# Patient Record
Sex: Female | Born: 1950 | Race: Black or African American | Hispanic: No | Smoking: Never smoker
Health system: Southern US, Community
[De-identification: ages and names within clinical notes are randomized; demographics above are authoritative.]

## PROBLEM LIST (undated history)

## (undated) ENCOUNTER — Ambulatory Visit: Admission: EM | Discharge status: Absent without leave | Payer: Medicare HMO

## (undated) DIAGNOSIS — D649 Anemia, unspecified: Secondary | ICD-10-CM

## (undated) DIAGNOSIS — I10 Essential (primary) hypertension: Secondary | ICD-10-CM

## (undated) DIAGNOSIS — N184 Chronic kidney disease, stage 4 (severe): Secondary | ICD-10-CM

## (undated) DIAGNOSIS — Z6835 Body mass index (BMI) 35.0-35.9, adult: Secondary | ICD-10-CM

## (undated) DIAGNOSIS — E785 Hyperlipidemia, unspecified: Secondary | ICD-10-CM

## (undated) DIAGNOSIS — K219 Gastro-esophageal reflux disease without esophagitis: Secondary | ICD-10-CM

## (undated) HISTORY — PX: TUBAL LIGATION: SHX77

## (undated) HISTORY — DX: Hyperlipidemia, unspecified: E78.5

## (undated) HISTORY — DX: Anemia, unspecified: D64.9

## (undated) HISTORY — DX: Morbid (severe) obesity due to excess calories: E66.01

## (undated) HISTORY — DX: Gastro-esophageal reflux disease without esophagitis: K21.9

## (undated) HISTORY — DX: Body mass index (BMI) 35.0-35.9, adult: Z68.35

## (undated) HISTORY — DX: Chronic kidney disease, stage 4 (severe): N18.4

---

## 2005-05-21 ENCOUNTER — Emergency Department: Payer: Self-pay | Admitting: Emergency Medicine

## 2006-06-18 ENCOUNTER — Emergency Department: Payer: Self-pay | Admitting: Emergency Medicine

## 2016-06-03 ENCOUNTER — Encounter: Payer: Self-pay | Admitting: *Deleted

## 2016-06-17 ENCOUNTER — Ambulatory Visit: Payer: Self-pay | Admitting: General Surgery

## 2016-06-29 ENCOUNTER — Ambulatory Visit (INDEPENDENT_AMBULATORY_CARE_PROVIDER_SITE_OTHER): Payer: Medicare HMO | Admitting: General Surgery

## 2016-06-29 ENCOUNTER — Encounter: Payer: Self-pay | Admitting: General Surgery

## 2016-06-29 VITALS — BP 124/78 | HR 74 | Resp 12 | Ht 63.0 in | Wt 213.0 lb

## 2016-06-29 DIAGNOSIS — Z1211 Encounter for screening for malignant neoplasm of colon: Secondary | ICD-10-CM | POA: Diagnosis not present

## 2016-06-29 MED ORDER — POLYETHYLENE GLYCOL 3350 17 GM/SCOOP PO POWD: ORAL | 0 refills | Status: DC

## 2016-06-29 NOTE — Progress Notes (Signed)
Patient ID: Destiny Adams, female   DOB: 01-30-1951, 65 y.o.   MRN: [id number]  Chief Complaint  Patient presents with  . Colonoscopy    HPI Destiny Adams is a 65 y.o. female Here today for a evaluation of a screening colonoscopy. Patient states she has no GI problem at this time. The patient's younger sister, Destiny Adams is present at visit.   I personally reviewed the patient's history. HPI  Past Medical History:  Diagnosis Date  . GERD (gastroesophageal reflux disease)   . Hyperlipidemia     Past Surgical History:  Procedure Laterality Date  . TUBAL LIGATION      Family History  Problem Relation Age of Onset  . Colon cancer Maternal Uncle   . Colon cancer Paternal Uncle   . Breast cancer Paternal Aunt     52    Social History Social History  Substance Use Topics  . Smoking status: Never Smoker  . Smokeless tobacco: Never Used  . Alcohol use No    No Known Allergies  Current Outpatient Prescriptions  Medication Sig Dispense Refill  . amlodipine-atorvastatin (CADUET) 10-20 MG tablet Take 1 tablet by mouth daily.    . meloxicam (MOBIC) 15 MG tablet Take 15 mg by mouth daily.    Marland Kitchen omeprazole (PRILOSEC) 20 MG capsule Take 20 mg by mouth daily.     No current facility-administered medications for this visit.     Review of Systems Review of Systems  Constitutional: Negative.   Respiratory: Negative.   Cardiovascular: Negative.   Gastrointestinal: Negative.     Blood pressure 124/78, pulse 74, resp. rate 12, height 5\' 3"  (1.6 m), weight 213 lb (96.6 kg).  Physical Exam Physical Exam  Constitutional: She is oriented to person, place, and time. She appears well-developed and well-nourished.  Eyes: Conjunctivae are normal. No scleral icterus.  Neck: Neck supple.  Cardiovascular: Normal rate, regular rhythm and normal heart sounds.   Pulmonary/Chest: Effort normal and breath sounds normal.  Abdominal: Soft. Bowel sounds are normal.  Lymphadenopathy:    She has  no cervical adenopathy.  Neurological: She is alert and oriented to person, place, and time.  Skin: Skin is warm and dry.    Data Reviewed PCP notes of 03/23/2016 reviewed.  Laboratory studies showed elevated serum creatinine 2 and estimated GFR of 29. Normal TSH.  Assessment    Candidate for screening colonoscopy.  Depressed renal function.    Plan    Case discussed with Dr. Brynda Greathouse regarding the use of Mobitz in light of her renal disease.  She should tolerate a non-cathartic prep without additional renal impairment.    Colonoscopy with possible biopsy/polypectomy prn: Information regarding the procedure, including its potential risks and complications (including but not limited to perforation of the bowel, which may require emergency surgery to repair, and bleeding) was verbally given to the patient. Educational information regarding lower intestinal endoscopy was given to the patient. Written instructions for how to complete the bowel prep using Miralax were provided. The importance of drinking ample fluids to avoid dehydration as a result of the prep emphasized.  Patient has been scheduled for a colonoscopy on 07-29-16 at [facility].   This information has been scribed by Gaspar Cola CMA.   Gaspar Cola 06/29/2016, 9:58 AM

## 2016-06-29 NOTE — Patient Instructions (Signed)
Colonoscopy A colonoscopy is an exam to look at the entire large intestine (colon). This exam can help find problems such as tumors, polyps, inflammation, and areas of bleeding. The exam takes about 1 hour.  LET YOUR HEALTH CARE PROVIDER KNOW ABOUT:   Any allergies you have.  All medicines you are taking, including vitamins, herbs, eye drops, creams, and over-the-counter medicines.  Previous problems you or members of your family have had with the use of anesthetics.  Any blood disorders you have.  Previous surgeries you have had.  Medical conditions you have. RISKS AND COMPLICATIONS  Generally, this is a safe procedure. However, as with any procedure, complications can occur. Possible complications include:  Bleeding.  Tearing or rupture of the colon wall.  Reaction to medicines given during the exam.  Infection (rare). BEFORE THE PROCEDURE   Ask your health care provider about changing or stopping your regular medicines.  You may be prescribed an oral bowel prep. This involves drinking a large amount of medicated liquid, starting the day before your procedure. The liquid will cause you to have multiple loose stools until your stool is almost clear or light green. This cleans out your colon in preparation for the procedure.  Do not eat or drink anything else once you have started the bowel prep, unless your health care provider tells you it is safe to do so.  Arrange for someone to drive you home after the procedure. PROCEDURE   You will be given medicine to help you relax (sedative).  You will lie on your side with your knees bent.  A long, flexible tube with a light and camera on the end (colonoscope) will be inserted through the rectum and into the colon. The camera sends video back to a computer screen as it moves through the colon. The colonoscope also releases carbon dioxide gas to inflate the colon. This helps your health care provider see the area better.  During  the exam, your health care provider may take a small tissue sample (biopsy) to be examined under a microscope if any abnormalities are found.  The exam is finished when the entire colon has been viewed. AFTER THE PROCEDURE   Do not drive for 24 hours after the exam.  You may have a small amount of blood in your stool.  You may pass moderate amounts of gas and have mild abdominal cramping or bloating. This is caused by the gas used to inflate your colon during the exam.  Ask when your test results will be ready and how you will get your results. Make sure you get your test results.   This information is not intended to replace advice given to you by your health care provider. Make sure you discuss any questions you have with your health care provider.   Document Released: 07/31/2000 Document Revised: 05/24/2013 Document Reviewed: 04/10/2013 Elsevier Interactive Patient Education 2016 Elsevier Inc.  

## 2016-07-29 ENCOUNTER — Ambulatory Visit: Payer: Medicare HMO | Admitting: Anesthesiology

## 2016-07-29 ENCOUNTER — Ambulatory Visit
Admission: RE | Admit: 2016-07-29 | Discharge: 2016-07-29 | Disposition: A | Discharge status: Home / Self Care | Payer: Medicare HMO | Source: Ambulatory Visit | Attending: General Surgery | Admitting: General Surgery

## 2016-07-29 ENCOUNTER — Encounter
Admission: RE | Disposition: A | Discharge status: Home / Self Care | Payer: Self-pay | Source: Ambulatory Visit | Attending: General Surgery

## 2016-07-29 DIAGNOSIS — Z1211 Encounter for screening for malignant neoplasm of colon: Secondary | ICD-10-CM | POA: Insufficient documentation

## 2016-07-29 DIAGNOSIS — K219 Gastro-esophageal reflux disease without esophagitis: Secondary | ICD-10-CM | POA: Insufficient documentation

## 2016-07-29 DIAGNOSIS — I10 Essential (primary) hypertension: Secondary | ICD-10-CM | POA: Diagnosis not present

## 2016-07-29 DIAGNOSIS — Z79899 Other long term (current) drug therapy: Secondary | ICD-10-CM | POA: Insufficient documentation

## 2016-07-29 DIAGNOSIS — Z6836 Body mass index (BMI) 36.0-36.9, adult: Secondary | ICD-10-CM | POA: Insufficient documentation

## 2016-07-29 DIAGNOSIS — E785 Hyperlipidemia, unspecified: Secondary | ICD-10-CM | POA: Insufficient documentation

## 2016-07-29 HISTORY — DX: Essential (primary) hypertension: I10

## 2016-07-29 HISTORY — PX: COLONOSCOPY WITH PROPOFOL: SHX5780

## 2016-07-29 LAB — BASIC METABOLIC PANEL
Anion gap: 7 (ref 5–15)
BUN: 27 mg/dL — AB (ref 6–20)
CHLORIDE: 111 mmol/L (ref 101–111)
CO2: 19 mmol/L — AB (ref 22–32)
CREATININE: 1.9 mg/dL — AB (ref 0.44–1.00)
Calcium: 8.8 mg/dL — ABNORMAL LOW (ref 8.9–10.3)
GFR calc Af Amer: 31 mL/min — ABNORMAL LOW (ref >60)
GFR calc non Af Amer: 26 mL/min — ABNORMAL LOW (ref >60)
GLUCOSE: 106 mg/dL — AB (ref 65–99)
POTASSIUM: 3.7 mmol/L (ref 3.5–5.1)
Sodium: 137 mmol/L (ref 135–145)

## 2016-07-29 SURGERY — COLONOSCOPY WITH PROPOFOL
Anesthesia: General

## 2016-07-29 MED ORDER — SODIUM CHLORIDE 0.9 % IV SOLN
INTRAVENOUS | Status: DC
  Administered 2016-07-29: 10:00:00 via INTRAVENOUS

## 2016-07-29 MED ORDER — PROPOFOL 500 MG/50ML IV EMUL
INTRAVENOUS | Status: DC | PRN
  Administered 2016-07-29: 120 ug/kg/min via INTRAVENOUS

## 2016-07-29 MED ORDER — PROPOFOL 10 MG/ML IV BOLUS
INTRAVENOUS | Status: DC | PRN
  Administered 2016-07-29 (×2): 30 mg via INTRAVENOUS

## 2016-07-29 MED ORDER — MIDAZOLAM HCL 2 MG/2ML IJ SOLN
INTRAMUSCULAR | Status: DC | PRN
  Administered 2016-07-29: 1 mg via INTRAVENOUS

## 2016-07-29 MED ORDER — FENTANYL CITRATE (PF) 100 MCG/2ML IJ SOLN
INTRAMUSCULAR | Status: DC | PRN
  Administered 2016-07-29: 50 ug via INTRAVENOUS

## 2016-07-29 NOTE — H&P (Signed)
Destiny Adams [id number] Feb 11, 1951     HPI: Healthy 65 y/o woman for screening colonoscopy. Tolerated prep well.   Prescriptions Prior to Admission  Medication Sig Dispense Refill Last Dose  . amlodipine-atorvastatin (CADUET) 10-20 MG tablet Take 1 tablet by mouth daily.   07/29/2016 at Unknown time  . meloxicam (MOBIC) 15 MG tablet Take 15 mg by mouth daily.   07/26/2016 at Unknown time  . omeprazole (PRILOSEC) 20 MG capsule Take 20 mg by mouth daily.   07/26/2016  . polyethylene glycol powder (GLYCOLAX/MIRALAX) powder 255 grams one bottle for colonoscopy prep 255 g 0    Not on File Past Medical History:  Diagnosis Date  . GERD (gastroesophageal reflux disease)   . Hyperlipidemia   . Hypertension    Past Surgical History:  Procedure Laterality Date  . TUBAL LIGATION     Social History   Social History  . Marital status: Divorced    Spouse name: N/A  . Number of children: N/A  . Years of education: N/A   Occupational History  . Not on file.   Social History Main Topics  . Smoking status: Never Smoker  . Smokeless tobacco: Never Used  . Alcohol use No  . Drug use: No  . Sexual activity: Not on file   Other Topics Concern  . Not on file   Social History Narrative  . No narrative on file   Social History   Social History Narrative  . No narrative on file     ROS: Negative.     PE: HEENT: Negative. Lungs: Clear. Cardio: RR.  Assessment/Plan:  Proceed with planned endoscopy.   Robert Bellow 07/29/2016

## 2016-07-29 NOTE — Transfer of Care (Signed)
Immediate Anesthesia Transfer of Care Note  Patient: Destiny Adams  Procedure(s) Performed: Procedure(s): COLONOSCOPY WITH PROPOFOL (N/A)  Patient Location: PACU  Anesthesia Type:General  Level of Consciousness: awake and alert   Airway & Oxygen Therapy: Patient Spontanous Breathing and Patient connected to nasal cannula oxygen  Post-op Assessment: Report given to RN and Post -op Vital signs reviewed and stable  Post vital signs: Reviewed  Last Vitals:  Vitals:   07/29/16 0942  BP: (!) 154/98  Pulse: 100  Resp: 16  Temp: 36.7 C    Last Pain:  Vitals:   07/29/16 0942  TempSrc: Tympanic         Complications: No apparent anesthesia complications

## 2016-07-29 NOTE — Anesthesia Postprocedure Evaluation (Signed)
Anesthesia Post Note  Patient: Destiny Adams  Procedure(s) Performed: Procedure(s) (LRB): COLONOSCOPY WITH PROPOFOL (N/A)  Patient location during evaluation: PACU Anesthesia Type: General Level of consciousness: awake Pain management: pain level controlled Vital Signs Assessment: post-procedure vital signs reviewed and stable Respiratory status: spontaneous breathing Cardiovascular status: blood pressure returned to baseline Postop Assessment: no headache Anesthetic complications: no    Last Vitals:  Vitals:   07/29/16 1130 07/29/16 1140  BP: 135/82 (!) 151/88  Pulse: 77 77  Resp: (!) 21 14  Temp:      Last Pain:  Vitals:   07/29/16 1140  TempSrc:   PainSc: 0-No pain                 VAN STAVEREN,Janique Hoefer

## 2016-07-29 NOTE — Anesthesia Preprocedure Evaluation (Signed)
Anesthesia Evaluation  Patient identified by MRN, date of birth, ID band Patient awake    Reviewed: Allergy & Precautions, NPO status , Patient's Chart, lab work & pertinent test results  Airway Mallampati: II       Dental  (+) Upper Dentures   Pulmonary neg pulmonary ROS,     + decreased breath sounds      Cardiovascular Exercise Tolerance: Good hypertension, Pt. on medications  Rhythm:Regular     Neuro/Psych negative neurological ROS     GI/Hepatic Neg liver ROS, GERD  Medicated,  Endo/Other  Morbid obesity  Renal/GU negative Renal ROS     Musculoskeletal   Abdominal   Peds negative pediatric ROS (+)  Hematology negative hematology ROS (+)   Anesthesia Other Findings   Reproductive/Obstetrics                             Anesthesia Physical Anesthesia Plan  ASA: II  Anesthesia Plan: General   Post-op Pain Management:    Induction: Intravenous  Airway Management Planned: Natural Airway and Nasal Cannula  Additional Equipment:   Intra-op Plan:   Post-operative Plan:   Informed Consent: I have reviewed the patients History and Physical, chart, labs and discussed the procedure including the risks, benefits and alternatives for the proposed anesthesia with the patient or authorized representative who has indicated his/her understanding and acceptance.     Plan Discussed with: Surgeon  Anesthesia Plan Comments:         Anesthesia Quick Evaluation

## 2016-07-29 NOTE — Op Note (Signed)
[facility] Patient Name: Destiny Adams Procedure Date: 07/29/2016 10:25 AM MRN: [id number] Account #: [id number] Date of Birth: 1950/11/05 Admit Type: Outpatient Age: 65 Room: [facility] ENDO ROOM 1 Gender: Female Note Status: Finalized Procedure:            Colonoscopy Indications:          Screening for colorectal malignant neoplasm Providers:            Robert Bellow, MD Referring MD:         Mikeal Hawthorne. Brynda Greathouse MD, MD (Referring MD) Medicines:            Monitored Anesthesia Care Complications:        No immediate complications. Procedure:            Pre-Anesthesia Assessment:                       - Prior to the procedure, a History and Physical was                        performed, and patient medications, allergies and                        sensitivities were reviewed. The patient's tolerance of                        previous anesthesia was reviewed.                       - The risks and benefits of the procedure and the                        sedation options and risks were discussed with the                        patient. All questions were answered and informed                        consent was obtained.                       After obtaining informed consent, the colonoscope was                        passed under direct vision. Throughout the procedure,                        the patient's blood pressure, pulse, and oxygen                        saturations were monitored continuously. The                        Colonoscope was introduced through the anus and                        advanced to the the cecum, identified by appendiceal                        orifice and ileocecal valve. The colonoscopy was  performed without difficulty. The patient tolerated the                        procedure well. The quality of the bowel preparation                        was excellent. Findings:      The entire examined colon  appeared normal on direct and retroflexion       views. Impression:           - The entire examined colon is normal on direct and                        retroflexion views.                       - No specimens collected. Recommendation:       - Repeat colonoscopy in 10 years for screening purposes. Procedure Code(s):    --- Professional ---                       [phone], Colonoscopy, flexible; diagnostic, including                        collection of specimen(s) by brushing or washing, when                        performed (separate procedure) Diagnosis Code(s):    --- Professional ---                       Z12.11, Encounter for screening for malignant neoplasm                        of colon CPT copyright 2016 American Medical Association. All rights reserved. The codes documented in this report are preliminary and upon coder review may  be revised to meet current compliance requirements. Robert Bellow, MD 07/29/2016 10:54:50 AM This report has been signed electronically. Number of Addenda: 0 Note Initiated On: 07/29/2016 10:25 AM Scope Withdrawal Time: 0 hours 9 minutes 44 seconds  Total Procedure Duration: 0 hours 14 minutes 45 seconds       [facility]

## 2016-07-30 ENCOUNTER — Encounter: Payer: Self-pay | Admitting: General Surgery

## 2017-06-16 ENCOUNTER — Ambulatory Visit: Payer: Self-pay | Admitting: Family Medicine

## 2017-06-22 ENCOUNTER — Ambulatory Visit: Payer: Self-pay | Admitting: Family Medicine

## 2017-07-22 ENCOUNTER — Ambulatory Visit: Payer: Medicare HMO | Admitting: Nurse Practitioner

## 2017-07-22 ENCOUNTER — Other Ambulatory Visit: Payer: Self-pay

## 2017-07-22 ENCOUNTER — Encounter: Payer: Self-pay | Admitting: Nurse Practitioner

## 2017-07-22 VITALS — BP 138/90 | HR 92 | Temp 98.2°F | Ht 61.5 in | Wt 191.0 lb

## 2017-07-22 DIAGNOSIS — E782 Mixed hyperlipidemia: Secondary | ICD-10-CM

## 2017-07-22 DIAGNOSIS — K219 Gastro-esophageal reflux disease without esophagitis: Secondary | ICD-10-CM | POA: Diagnosis not present

## 2017-07-22 DIAGNOSIS — N184 Chronic kidney disease, stage 4 (severe): Secondary | ICD-10-CM

## 2017-07-22 DIAGNOSIS — Z7689 Persons encountering health services in other specified circumstances: Secondary | ICD-10-CM

## 2017-07-22 DIAGNOSIS — I10 Essential (primary) hypertension: Secondary | ICD-10-CM

## 2017-07-22 DIAGNOSIS — Z6835 Body mass index (BMI) 35.0-35.9, adult: Secondary | ICD-10-CM | POA: Diagnosis not present

## 2017-07-22 NOTE — Progress Notes (Signed)
Subjective:    Patient ID: Destiny Adams, female    DOB: 1950/08/25, 66 y.o.   MRN: [id number]  Destiny Adams is a 66 y.o. female presenting on 07/22/2017 for [place] (pt just recently loss her mother )   HPI [place] Provider Pt last seen by PCP Dr. Brynda Greathouse 2-3 months ago.  Obtain records from Dr. Brynda Greathouse.   CKD Stage IV Pt reports she has stage IV kidney disease.  She notes it stabilized once she started getting better blood pressure control.  She reports her last labs with Dr. Brynda Greathouse were about 5 months ago.  She has not yet seen a nephrologist.  Hypertension - She is not checking BP at home or outside of clinic.    - Current medications: amlodipine-benazepril, tolerating well without side effects - She is not currently symptomatic. - Pt denies headache, lightheadedness, dizziness, changes in vision, chest tightness/pressure, palpitations, leg swelling, sudden loss of speech or loss of consciousness. - She  reports no regular exercise routine. - Her diet is moderate in salt, moderate in fat, and moderate in carbohydrates.  GERD Pt has regular acid reflux that is well controlled on omeprazole.  She has trialed off omeprazole in past, but has severe heartburn.  She reports no breakthrough heartburn symptoms when she is taking her PPI and desires to continue.  Grief Pt's mother died about 2 weeks ago.  She had suffered many years from Alzheimer's disease.  Pt is well supported by family and faith support system.  Pt reports no significantly abnormal grief process to date.   Past Medical History:  Diagnosis Date  . Anemia   . CKD (chronic kidney disease) stage 4, GFR 15-29 ml/min (HCC) 07/23/2017  . GERD (gastroesophageal reflux disease)   . Hyperlipidemia   . Hypertension   . Severe obesity (BMI 35.0-35.9 with comorbidity) ([place]) 07/23/2017   Comorbid CKD stage IV, Hypertension, Hyperlipidemia   Past Surgical History:  Procedure Laterality Date  . COLONOSCOPY  WITH PROPOFOL N/A 07/29/2016   Procedure: COLONOSCOPY WITH PROPOFOL;  Surgeon: Robert Bellow, MD;  Location: [facility] ENDOSCOPY;  Service: Endoscopy;  Laterality: N/A;  . TUBAL LIGATION     Social History   Socioeconomic History  . Marital status: Divorced    Spouse name: Not on file  . Number of children: Not on file  . Years of education: Not on file  . Highest education level: Not on file  Social Needs  . Financial resource strain: Not on file  . Food insecurity - worry: Not on file  . Food insecurity - inability: Not on file  . Transportation needs - medical: Not on file  . Transportation needs - non-medical: Not on file  Occupational History  . Not on file  Tobacco Use  . Smoking status: Never Smoker  . Smokeless tobacco: Never Used  Substance and Sexual Activity  . Alcohol use: No  . Drug use: No  . Sexual activity: Not on file  Other Topics Concern  . Not on file  Social History Narrative  . Not on file   Family History  Problem Relation Age of Onset  . Colon cancer Maternal Uncle   . Colon cancer Paternal Uncle   . Breast cancer Paternal Aunt        59  . Alzheimer's disease Mother   . Cancer Father    Current Outpatient Medications on File Prior to Visit  Medication Sig  . amlodipine-atorvastatin (CADUET) 10-20 MG tablet Take  1 tablet by mouth daily.  Marland Kitchen omeprazole (PRILOSEC) 20 MG capsule Take 20 mg by mouth daily.   No current facility-administered medications on file prior to visit.     Review of Systems  Constitutional: Negative.   HENT: Negative.   Eyes: Negative.   Respiratory: Negative.   Cardiovascular: Negative.   Gastrointestinal: Negative.   Endocrine: Negative.   Genitourinary: Negative.   Musculoskeletal: Positive for arthralgias.  Skin: Negative.   Allergic/Immunologic: Negative.   Neurological: Negative.   Hematological: Negative.   Psychiatric/Behavioral: Negative.    Per HPI unless specifically indicated above        Objective:    BP 138/90 (BP Location: Right Arm, Patient Position: Sitting, Cuff Size: Normal)   Pulse 92   Temp 98.2 F (36.8 C) (Oral)   Ht 5' 1.5" (1.562 m)   Wt 191 lb (86.6 kg)   BMI 35.50 kg/m   Wt Readings from Last 3 Encounters:  07/22/17 191 lb (86.6 kg)  07/29/16 210 lb (95.3 kg)  06/29/16 213 lb (96.6 kg)    Physical Exam  General - healthy, well-appearing, NAD HEENT - Normocephalic, atraumatic Neck - supple, non-tender, no LAD, no thyromegaly, no carotid bruit Heart - RRR, no murmurs heard Lungs - Clear throughout all lobes, no wheezing, crackles, or rhonchi. Normal work of breathing. Extremeties - non-tender, no edema, cap refill < 2 seconds, peripheral pulses intact +2 bilaterally Skin - warm, dry Neuro - awake, alert, oriented x3, normal gait Psych - Normal mood and affect, normal behavior, tearful when discussing her mother's death but in appropriate response to feelings and easily redirected when needed.    Results for orders placed or performed during the hospital encounter of 24/23/53  Basic metabolic panel  Result Value Ref Range   Sodium 137 135 - 145 mmol/L   Potassium 3.7 3.5 - 5.1 mmol/L   Chloride 111 101 - 111 mmol/L   CO2 19 (L) 22 - 32 mmol/L   Glucose, Bld 106 (H) 65 - 99 mg/dL   BUN 27 (H) 6 - 20 mg/dL   Creatinine, Ser 1.90 (H) 0.44 - 1.00 mg/dL   Calcium 8.8 (L) 8.9 - 10.3 mg/dL   GFR calc non Af Amer 26 (L) >60 mL/min   GFR calc Af Amer 31 (L) >60 mL/min   Anion gap 7 5 - 15      Assessment & Plan:   Problem List Items Addressed This Visit      Cardiovascular and Mediastinum   Hypertension    Currently controlled hypertension, but slightly above BP goal of < 130/80 w/ known CKD Stage IV.  Pt is currently working on lifestyle modifications for improving diet.  She has lost about 15 lbs.  Taking medications amlodipine-benazepril and is tolerating well without side effects.  -Complications: CKD, obesity  Plan: 1. Continue taking  amlodipine-benazepril without changes today. - Focus on losing another 5-10 lbs before medication adjustment. 2. Obtain labs at next visit after reviewing results from Dr. Brynda Greathouse  3. Encouraged heart healthy diet and increasing exercise to 30 minutes most days of the week. 4. Check BP 1-2 x per week at home, keep log, and bring to clinic at next appointment. 5. Follow up 3 months.          Digestive   GERD (gastroesophageal reflux disease)    Currently well controlled on daily omeprazole.  Pt reports no intermittent or breakthrough heartburn.  Plan: 1. Continue omeprazole once daily. 2. Encouraged weight loss of  additional 5-10 lbs. 3. Encouraged smaller meals, avoid trigger foods, alcohol. 4. Followup every 6-12 months.        Genitourinary   CKD (chronic kidney disease) stage 4, GFR 15-29 ml/min (HCC)    Pt reports she has had stable GFR < 30 over last couple of years.  Uncontrolled hypertension was primary factor for worsening kidney function per pt.  Plan: 1. Request records for labs. 2. Consider referral to nephrology. 3. Continue HTN management. 4. Followup w/ labs every 3-6 months.         Other   Hyperlipidemia    Pt has not previously been on medications.  No lipid panel available.  Will await records from Dr. Brynda Greathouse.  Plan: 1. Recheck lipid panel at least every 6 months. 2. Encouraged heart healthy diet. 3. Encouraged regular physical activity most days of the week. 4. Followup in 3 months.      Severe obesity (BMI 35.0-35.9 with comorbidity) (HCC)    Pt overweight w/ recent loss of about 15 lbs per pt report.  Comorbid CKD stage IV, Hypertension, Hyperlipidemia.  Plan: 1. Encouraged continued weight loss of at least 5-10 lbs. 2. See AP comorbidities above. 3. Followup at least once annually.        Other Visit Diagnoses    Encounter to establish care    -  Primary   Pt w/ prior PCP Dr. Brynda Greathouse.  Last visit about 2 months ago. Records will be requested.   History reviewed w/ pt today.        Follow up plan: Return in about 3 months (around 10/20/2017) for blood pressure and possibly labs.  [name], DNP, AGPCNP-BC Adult Gerontology Primary Care Nurse Practitioner [facility] 07/23/2017, 10:01 AM

## 2017-07-22 NOTE — Patient Instructions (Addendum)
Destiny Adams, Thank you for coming in to clinic today.  1. For your cough, - You can use saline only nose spray for the dry sinuses.  2. For your blood pressure, - Continue amlodipine -benazepril without changes today.  3. For your heartburn, - Continue your omeprazole 20 mg once daily.  Please schedule a follow-up appointment with [name], AGNP. Return in about 3 months (around 10/20/2017) for blood pressure and possibly labs.  If you have any other questions or concerns, please feel free to call the clinic or send a message through [place]. You may also schedule an earlier appointment if necessary.  You will receive a survey after today's visit either digitally by e-mail or paper by [employer]. Your experiences and feedback matter to [place].  Please respond so we know how we are doing as we provide care for you.   [name], DNP, AGNP-BC Adult Gerontology Nurse Practitioner [place]

## 2017-07-23 ENCOUNTER — Encounter: Payer: Self-pay | Admitting: Nurse Practitioner

## 2017-07-23 DIAGNOSIS — Z6835 Body mass index (BMI) 35.0-35.9, adult: Secondary | ICD-10-CM

## 2017-07-23 DIAGNOSIS — K219 Gastro-esophageal reflux disease without esophagitis: Secondary | ICD-10-CM | POA: Insufficient documentation

## 2017-07-23 DIAGNOSIS — I10 Essential (primary) hypertension: Secondary | ICD-10-CM | POA: Insufficient documentation

## 2017-07-23 DIAGNOSIS — I120 Hypertensive chronic kidney disease with stage 5 chronic kidney disease or end stage renal disease: Secondary | ICD-10-CM | POA: Insufficient documentation

## 2017-07-23 DIAGNOSIS — N184 Chronic kidney disease, stage 4 (severe): Secondary | ICD-10-CM

## 2017-07-23 DIAGNOSIS — E785 Hyperlipidemia, unspecified: Secondary | ICD-10-CM | POA: Insufficient documentation

## 2017-07-23 DIAGNOSIS — N185 Chronic kidney disease, stage 5: Secondary | ICD-10-CM

## 2017-07-23 HISTORY — DX: Morbid (severe) obesity due to excess calories: E66.01

## 2017-07-23 HISTORY — DX: Chronic kidney disease, stage 4 (severe): N18.4

## 2017-07-23 NOTE — Assessment & Plan Note (Signed)
Pt has not previously been on medications.  No lipid panel available.  Will await records from Dr. Brynda Greathouse.  Plan: 1. Recheck lipid panel at least every 6 months. 2. Encouraged heart healthy diet. 3. Encouraged regular physical activity most days of the week. 4. Followup in 3 months.

## 2017-07-23 NOTE — Assessment & Plan Note (Signed)
Pt reports she has had stable GFR < 30 over last couple of years.  Uncontrolled hypertension was primary factor for worsening kidney function per pt.  Plan: 1. Request records for labs. 2. Consider referral to nephrology. 3. Continue HTN management. 4. Followup w/ labs every 3-6 months.

## 2017-07-23 NOTE — Assessment & Plan Note (Signed)
Currently controlled hypertension, but slightly above BP goal of < 130/80 w/ known CKD Stage IV.  Pt is currently working on lifestyle modifications for improving diet.  She has lost about 15 lbs.  Taking medications amlodipine-benazepril and is tolerating well without side effects.  -Complications: CKD, obesity  Plan: 1. Continue taking amlodipine-benazepril without changes today. - Focus on losing another 5-10 lbs before medication adjustment. 2. Obtain labs at next visit after reviewing results from Dr. Brynda Greathouse  3. Encouraged heart healthy diet and increasing exercise to 30 minutes most days of the week. 4. Check BP 1-2 x per week at home, keep log, and bring to clinic at next appointment. 5. Follow up 3 months.

## 2017-07-23 NOTE — Assessment & Plan Note (Signed)
Pt overweight w/ recent loss of about 15 lbs per pt report.  Comorbid CKD stage IV, Hypertension, Hyperlipidemia.  Plan: 1. Encouraged continued weight loss of at least 5-10 lbs. 2. See AP comorbidities above. 3. Followup at least once annually.

## 2017-07-23 NOTE — Assessment & Plan Note (Signed)
Currently well controlled on daily omeprazole.  Pt reports no intermittent or breakthrough heartburn.  Plan: 1. Continue omeprazole once daily. 2. Encouraged weight loss of additional 5-10 lbs. 3. Encouraged smaller meals, avoid trigger foods, alcohol. 4. Followup every 6-12 months.

## 2017-09-09 ENCOUNTER — Telehealth: Payer: Self-pay | Admitting: Nurse Practitioner

## 2017-09-09 NOTE — Telephone Encounter (Signed)
Pt said someone needs to contact [facility] about her omeprazole and amlodipine.  Her call back number is [phone]

## 2017-09-10 MED ORDER — OMEPRAZOLE 20 MG PO CPDR: 20.0000 mg | DELAYED_RELEASE_CAPSULE | Freq: Every day | ORAL | 1 refills | Status: DC

## 2017-09-10 MED ORDER — AMLODIPINE-ATORVASTATIN 10-20 MG PO TABS: 1.0000 | ORAL_TABLET | Freq: Every day | ORAL | 1 refills | Status: DC

## 2017-09-13 ENCOUNTER — Telehealth: Payer: Self-pay

## 2017-09-13 DIAGNOSIS — K219 Gastro-esophageal reflux disease without esophagitis: Secondary | ICD-10-CM

## 2017-09-13 DIAGNOSIS — I10 Essential (primary) hypertension: Secondary | ICD-10-CM

## 2017-09-13 DIAGNOSIS — E782 Mixed hyperlipidemia: Secondary | ICD-10-CM

## 2017-09-13 MED ORDER — OMEPRAZOLE 20 MG PO CPDR: 20.0000 mg | DELAYED_RELEASE_CAPSULE | Freq: Every day | ORAL | 1 refills | Status: DC

## 2017-09-13 MED ORDER — ATORVASTATIN CALCIUM 20 MG PO TABS: 20.0000 mg | ORAL_TABLET | Freq: Every day | ORAL | 3 refills | Status: DC

## 2017-09-13 MED ORDER — AMLODIPINE BESYLATE 10 MG PO TABS
10.0000 mg | ORAL_TABLET | Freq: Every day | ORAL | 3 refills | Status: DC
Start: 2017-09-13 — End: 2018-07-04

## 2017-09-13 NOTE — Telephone Encounter (Signed)
Will send caduet as separate prescriptions.  Pt will have 3 pills now, dosing and instructions are the same.

## 2017-09-13 NOTE — Telephone Encounter (Signed)
I called [facility] and they informed me that the Caduet was on Manufacturer backorder, but now do not even fill that medication. The reason for the calls is because they are trying to get refill medications from Dr. Brynda Greathouse office.

## 2017-09-13 NOTE — Telephone Encounter (Signed)
Pt called requesting that e send her bp & acid reflux medication to [facility]. I informed the pt that we sent her Caduet 10-20MG  and Omeprazole on last week over to [facility], but I will call and check on the status of them prescriptions since they are still calling stating they need a new prescription.

## 2017-09-20 ENCOUNTER — Telehealth: Payer: Self-pay | Admitting: Nurse Practitioner

## 2017-09-20 NOTE — Telephone Encounter (Signed)
Pt has a question about atorvastatin [phone]

## 2017-09-20 NOTE — Telephone Encounter (Signed)
The pt was notified about her medication. No questions or concerns.

## 2017-09-20 NOTE — Telephone Encounter (Signed)
Attempted to contact the pt, no answer. LMOM to return my call.  

## 2017-10-05 ENCOUNTER — Telehealth: Payer: Self-pay | Admitting: Nurse Practitioner

## 2017-10-05 NOTE — Telephone Encounter (Signed)
Called to schedule AWV with Nurse Health Advisor. °Kathryn Brown °[phone]  °Skype [email or web address]  ° °

## 2017-10-21 ENCOUNTER — Ambulatory Visit: Payer: Medicare HMO | Admitting: Nurse Practitioner

## 2017-11-05 ENCOUNTER — Other Ambulatory Visit: Payer: Self-pay

## 2017-11-05 ENCOUNTER — Encounter: Payer: Self-pay | Admitting: Nurse Practitioner

## 2017-11-05 ENCOUNTER — Ambulatory Visit (INDEPENDENT_AMBULATORY_CARE_PROVIDER_SITE_OTHER): Payer: Medicare HMO | Admitting: Nurse Practitioner

## 2017-11-05 VITALS — BP 135/85 | HR 106 | Ht 61.5 in | Wt 189.2 lb

## 2017-11-05 DIAGNOSIS — K219 Gastro-esophageal reflux disease without esophagitis: Secondary | ICD-10-CM | POA: Diagnosis not present

## 2017-11-05 DIAGNOSIS — N184 Chronic kidney disease, stage 4 (severe): Secondary | ICD-10-CM | POA: Diagnosis not present

## 2017-11-05 DIAGNOSIS — I10 Essential (primary) hypertension: Secondary | ICD-10-CM

## 2017-11-05 DIAGNOSIS — J301 Allergic rhinitis due to pollen: Secondary | ICD-10-CM | POA: Insufficient documentation

## 2017-11-05 DIAGNOSIS — E782 Mixed hyperlipidemia: Secondary | ICD-10-CM

## 2017-11-05 MED ORDER — FLUTICASONE PROPIONATE 50 MCG/ACT NA SUSP: 2.0000 | Freq: Every day | NASAL | 6 refills | Status: DC

## 2017-11-05 MED ORDER — OMEPRAZOLE 20 MG PO CPDR: 20.0000 mg | DELAYED_RELEASE_CAPSULE | Freq: Every day | ORAL | 2 refills | Status: DC

## 2017-11-05 MED ORDER — LORATADINE 10 MG PO TABS: 10.0000 mg | ORAL_TABLET | Freq: Every day | ORAL | 2 refills | Status: DC

## 2017-11-05 NOTE — Assessment & Plan Note (Signed)
Currently controlled hypertension, but slightly above BP goal of < 130/80 w/ known CKD Stage IV.  Pt is currently working on lifestyle modifications for improving diet.  Worsening BP today after acute stress from a family argument. -Complications: CKD, obesity  Plan: 1. Continue taking amlodipine without changes today. 2. Obtain labs at next visit after reviewing results from Dr. Brynda Greathouse  3. Encouraged heart healthy diet and increasing exercise to 30 minutes most days of the week. 4. Check BP 1-2 x per week at home, keep log, and bring to clinic at next appointment. Goal < 130/80.  Pt verbalized understanding she will call if remains above goal. 5. Follow up 3 months.

## 2017-11-05 NOTE — Assessment & Plan Note (Signed)
Currently well controlled on daily omeprazole.  Pt reports no intermittent or breakthrough heartburn.  Plan: 1. Continue omeprazole once daily. 2. Encouraged weight loss of additional 5-10 lbs. 3. Encouraged smaller meals, avoid trigger foods, alcohol. 4. Followup 6 months.

## 2017-11-05 NOTE — Assessment & Plan Note (Signed)
Acute onset of excessive tearing of eyes that is relieved on days pt is taking loratadine.  Pt with seasonal pattern to symptoms and prior regular use of loratadine.   Plan: 1. Continue loratadine.  Take daily. - USE flonase 1-2 sprays in each nostril daily for 4-6 weeks. 2. Consider eye allergy drop if needed.   3. Followup as needed

## 2017-11-05 NOTE — Patient Instructions (Signed)
Destiny Adams,   Thank you for coming in to clinic today.  Continue all medications without changes today.   - Continue your low salt diet - Increase your physical activity until you are increasing your heart rate for 30 minutes on most days of the week.  For your allergies: - Take loratadine(Claritin) daily for the next 2-3 months during your allergy season. - If worsens, can use flonase 1-2 sprays in each nostril daily for 6-8 weeks.  Please schedule a follow-up appointment with [name], AGNP. Return in about 3 months (around 02/05/2018) for hypertension AND labs next week.  If you have any other questions or concerns, please feel free to call the clinic or send a message through [place]. You may also schedule an earlier appointment if necessary.  You will receive a survey after today's visit either digitally by e-mail or paper by [employer]. Your experiences and feedback matter to [place].  Please respond so we know how we are doing as we provide care for you.   [name], DNP, AGNP-BC Adult Gerontology Nurse Practitioner [facility], [facility]   Stress and Stress Management Stress is a normal reaction to life events. It is what you feel when life demands more than you are used to or more than you can handle. Some stress can be useful. For example, the stress reaction can help you catch the last bus of the day, study for a test, or meet a deadline at work. But stress that occurs too often or for too long can cause problems. It can affect your emotional health and interfere with relationships and normal daily activities. Too much stress can weaken your immune system and increase your risk for physical illness. If you already have a medical problem, stress can make it worse. What are the causes? All sorts of life events may cause stress. An event that causes stress for one person may not be stressful for another person. Major life events commonly cause stress. These may  be positive or negative. Examples include losing your job, moving into a new home, getting married, having a baby, or losing a loved one. Less obvious life events may also cause stress, especially if they occur day after day or in combination. Examples include working long hours, driving in traffic, caring for children, being in debt, or being in a difficult relationship. What are the signs or symptoms? Stress may cause emotional symptoms including, the following:  Anxiety. This is feeling worried, afraid, on edge, overwhelmed, or out of control.  Anger. This is feeling irritated or impatient.  Depression. This is feeling sad, down, helpless, or guilty.  Difficulty focusing, remembering, or making decisions.  Stress may cause physical symptoms, including the following:  Aches and pains. These may affect your head, neck, back, stomach, or other areas of your body.  Tight muscles or clenched jaw.  Low energy or trouble sleeping.  Stress may cause unhealthy behaviors, including the following:  Eating to feel better (overeating) or skipping meals.  Sleeping too little, too much, or both.  Working too much or putting off tasks (procrastination).  Smoking, drinking alcohol, or using drugs to feel better.  How is this diagnosed? Stress is diagnosed through an assessment by your health care provider. Your health care provider will ask questions about your symptoms and any stressful life events.Your health care provider will also ask about your medical history and may order blood tests or other tests. Certain medical conditions and medicine can cause  physical symptoms similar to stress. Mental illness can cause emotional symptoms and unhealthy behaviors similar to stress. Your health care provider may refer you to a mental health professional for further evaluation. How is this treated? Stress management is the recommended treatment for stress.The goals of stress management are reducing  stressful life events and coping with stress in healthy ways. Techniques for reducing stressful life events include the following:  Stress identification. Self-monitor for stress and identify what causes stress for you. These skills may help you to avoid some stressful events.  Time management. Set your priorities, keep a calendar of events, and learn to say "no." These tools can help you avoid making too many commitments.  Techniques for coping with stress include the following:  Rethinking the problem. Try to think realistically about stressful events rather than ignoring them or overreacting. Try to find the positives in a stressful situation rather than focusing on the negatives.  Exercise. Physical exercise can release both physical and emotional tension. The key is to find a form of exercise you enjoy and do it regularly.  Relaxation techniques. These relax the body and mind. Examples include yoga, meditation, tai chi, biofeedback, deep breathing, progressive muscle relaxation, listening to music, being out in nature, journaling, and other hobbies. Again, the key is to find one or more that you enjoy and can do regularly.  Healthy lifestyle. Eat a balanced diet, get plenty of sleep, and do not smoke. Avoid using alcohol or drugs to relax.  Strong support network. Spend time with family, friends, or other people you enjoy being around.Express your feelings and talk things over with someone you trust.  Counseling or talktherapy with a mental health professional may be helpful if you are having difficulty managing stress on your own. Medicine is typically not recommended for the treatment of stress.Talk to your health care provider if you think you need medicine for symptoms of stress. Follow these instructions at home:  Keep all follow-up visits as directed by your health care provider.  Take all medicines as directed by your health care provider. Contact a health care provider  if:  Your symptoms get worse or you start having new symptoms.  You feel overwhelmed by your problems and can no longer manage them on your own. Get help right away if:  You feel like hurting yourself or someone else. This information is not intended to replace advice given to you by your health care provider. Make sure you discuss any questions you have with your health care provider. Document Released: 01/27/2001 Document Revised: 01/09/2016 Document Reviewed: 03/28/2013 Elsevier Interactive Patient Education  2017 [employer].

## 2017-11-05 NOTE — Assessment & Plan Note (Signed)
Pt reports she has had stable GFR < 30 over last couple of years. Last GFR = 31 available in chart from 2017.  No recent check. Uncontrolled hypertension was primary factor for worsening kidney function per pt.   Plan: 1. Collect CMP next week. 2. Consider referral to nephrology. 3. Continue HTN management. 4. Followup w/ labs every 3-6 months.

## 2017-11-05 NOTE — Progress Notes (Signed)
Subjective:    Patient ID: Destiny Adams, female    DOB: 06/22/1951, 67 y.o.   MRN: [id number]  ARCHANA ECKMAN is a 67 y.o. female presenting on 11/05/2017 for Hypertension; Chronic Kidney Disease; and Allergies (Rt eye swelling, redness, and watery. Pt states it always happen during allergy season)   HPI Hypertension  - She is not checking BP at home or outside of clinic.    - Current medications: amlodipine 10 mg once daily, tolerating well without side effects - She is symptomatic with headache this morning, but admits to having high salt meals yesterday. - Pt denies lightheadedness, dizziness, changes in vision, chest tightness/pressure, palpitations, leg swelling, sudden loss of speech or loss of consciousness. - She  reports no regular exercise routine. - Her diet is high in salt, moderate in fat, and moderate in carbohydrates.  - Patient also notes increased stress and anxiety today after an argument with her sister last night   CKD IV  No kidney specialist in past.  Has been checked regularly with stable kidney function since diagnosis.    Allergies Watery eyes, worst on R side and has sinus congestion.  Pt states it occurs yearly during spring with increased pollen. - denies any systemic symptoms or signs of infection including fever, chills, sweats. - Has been taking claritin in years past and has provided relief for symptoms.  Is currently only taking claritin every 2-3 days.  Feels it isn't safe to take daily because of her hypertension.  Social History   Tobacco Use  . Smoking status: Never Smoker  . Smokeless tobacco: Never Used  Substance Use Topics  . Alcohol use: No  . Drug use: No    Review of Systems Per HPI unless specifically indicated above     Objective:    BP 135/85 (BP Location: Right Arm, Patient Position: Sitting, Cuff Size: Normal)   Pulse (!) 106   Ht 5' 1.5" (1.562 m)   Wt 189 lb 3.2 oz (85.8 kg)   SpO2 100%   BMI 35.17 kg/m   Wt  Readings from Last 3 Encounters:  11/05/17 189 lb 3.2 oz (85.8 kg)  07/22/17 191 lb (86.6 kg)  07/29/16 210 lb (95.3 kg)    Physical Exam  Constitutional: She is oriented to person, place, and time. She appears well-developed and well-nourished. No distress.  HENT:  Head: Normocephalic and atraumatic.  Right Ear: Hearing, tympanic membrane, external ear and ear canal normal.  Left Ear: Hearing, tympanic membrane, external ear and ear canal normal.  Nose: Mucosal edema and rhinorrhea present. Right sinus exhibits maxillary sinus tenderness. Right sinus exhibits no frontal sinus tenderness. Left sinus exhibits maxillary sinus tenderness. Left sinus exhibits no frontal sinus tenderness.  Eyes: Right eye exhibits discharge (excessive tearing).  Neck: Normal range of motion. Neck supple. Carotid bruit is not present.  Cardiovascular: Normal rate, regular rhythm, S1 normal, S2 normal, normal heart sounds and intact distal pulses.  Pulmonary/Chest: Effort normal and breath sounds normal. No respiratory distress.  Musculoskeletal: She exhibits no edema (pedal).  Neurological: She is alert and oriented to person, place, and time.  Tremor present - active and resting  Skin: Skin is warm and dry.  Psychiatric: Her behavior is normal. Her mood appears anxious. Her speech is rapid and/or pressured.  Vitals reviewed.    Results for orders placed or performed during the hospital encounter of 44/96/75  Basic metabolic panel  Result Value Ref Range   Sodium 137 135 -  145 mmol/L   Potassium 3.7 3.5 - 5.1 mmol/L   Chloride 111 101 - 111 mmol/L   CO2 19 (L) 22 - 32 mmol/L   Glucose, Bld 106 (H) 65 - 99 mg/dL   BUN 27 (H) 6 - 20 mg/dL   Creatinine, Ser 1.90 (H) 0.44 - 1.00 mg/dL   Calcium 8.8 (L) 8.9 - 10.3 mg/dL   GFR calc non Af Amer 26 (L) >60 mL/min   GFR calc Af Amer 31 (L) >60 mL/min   Anion gap 7 5 - 15      Assessment & Plan:   Problem List Items Addressed This Visit       Cardiovascular and Mediastinum   Hypertension - Primary    Currently controlled hypertension, but slightly above BP goal of < 130/80 w/ known CKD Stage IV.  Pt is currently working on lifestyle modifications for improving diet.  Worsening BP today after acute stress from a family argument. -Complications: CKD, obesity  Plan: 1. Continue taking amlodipine without changes today. 2. Obtain labs at next visit after reviewing results from Dr. Brynda Greathouse  3. Encouraged heart healthy diet and increasing exercise to 30 minutes most days of the week. 4. Check BP 1-2 x per week at home, keep log, and bring to clinic at next appointment. Goal < 130/80.  Pt verbalized understanding she will call if remains above goal. 5. Follow up 3 months.        Relevant Orders   COMPLETE METABOLIC PANEL WITH GFR   Lipid panel     Respiratory   Seasonal allergic rhinitis due to pollen    Acute onset of excessive tearing of eyes that is relieved on days pt is taking loratadine.  Pt with seasonal pattern to symptoms and prior regular use of loratadine.   Plan: 1. Continue loratadine.  Take daily. - USE flonase 1-2 sprays in each nostril daily for 4-6 weeks. 2. Consider eye allergy drop if needed.   3. Followup as needed      Relevant Medications   loratadine (CLARITIN) 10 MG tablet   fluticasone (FLONASE) 50 MCG/ACT nasal spray     Digestive   GERD (gastroesophageal reflux disease)    Currently well controlled on daily omeprazole.  Pt reports no intermittent or breakthrough heartburn.  Plan: 1. Continue omeprazole once daily. 2. Encouraged weight loss of additional 5-10 lbs. 3. Encouraged smaller meals, avoid trigger foods, alcohol. 4. Followup 6 months.      Relevant Medications   omeprazole (PRILOSEC) 20 MG capsule     Genitourinary   CKD (chronic kidney disease) stage 4, GFR 15-29 ml/min (HCC)    Pt reports she has had stable GFR < 30 over last couple of years. Last GFR = 31 available in chart  from 2017.  No recent check. Uncontrolled hypertension was primary factor for worsening kidney function per pt.   Plan: 1. Collect CMP next week. 2. Consider referral to nephrology. 3. Continue HTN management. 4. Followup w/ labs every 3-6 months.       Relevant Orders   COMPLETE METABOLIC PANEL WITH GFR     Other   Hyperlipidemia    Pt is taking atorvastatin 10 mg once daily and is tolerating well without side effects. No recent lipid check.  Plan: 1. Recheck lipid panel next week and at least every 6 months. 2. Encouraged heart healthy diet. 3. Encouraged regular physical activity most days of the week. 4. Followup in 3 months.  Relevant Orders   Lipid panel      Meds ordered this encounter  Medications  . loratadine (CLARITIN) 10 MG tablet    Sig: Take 1 tablet (10 mg total) by mouth daily.    Dispense:  90 tablet    Refill:  2    Order Specific Question:   Supervising Provider    Answer:   Olin Hauser [[id number]]  . omeprazole (PRILOSEC) 20 MG capsule    Sig: Take 1 capsule (20 mg total) by mouth daily.    Dispense:  90 capsule    Refill:  2    Order Specific Question:   Supervising Provider    Answer:   Olin Hauser [[id number]]  . fluticasone (FLONASE) 50 MCG/ACT nasal spray    Sig: Place 2 sprays into both nostrils daily.    Dispense:  16 g    Refill:  6    Order Specific Question:   Supervising Provider    Answer:   Olin Hauser [[id number]]      Follow up plan: Return in about 3 months (around 02/05/2018) for hypertension AND labs next week.   [name], DNP, AGPCNP-BC Adult Gerontology Primary Care Nurse Practitioner [facility] 11/05/2017, 7:38 PM

## 2017-11-05 NOTE — Assessment & Plan Note (Signed)
Pt is taking atorvastatin 10 mg once daily and is tolerating well without side effects. No recent lipid check.  Plan: 1. Recheck lipid panel next week and at least every 6 months. 2. Encouraged heart healthy diet. 3. Encouraged regular physical activity most days of the week. 4. Followup in 3 months.

## 2017-11-09 ENCOUNTER — Other Ambulatory Visit: Payer: Medicare HMO

## 2017-11-09 DIAGNOSIS — N184 Chronic kidney disease, stage 4 (severe): Secondary | ICD-10-CM | POA: Diagnosis not present

## 2017-11-09 DIAGNOSIS — E782 Mixed hyperlipidemia: Secondary | ICD-10-CM | POA: Diagnosis not present

## 2017-11-09 DIAGNOSIS — I10 Essential (primary) hypertension: Secondary | ICD-10-CM | POA: Diagnosis not present

## 2017-11-09 LAB — LIPID PANEL
Cholesterol: 152 mg/dL (ref <200)
HDL: 89 mg/dL (ref >50)
LDL Cholesterol (Calc): 49 mg/dL (calc)
Non-HDL Cholesterol (Calc): 63 mg/dL (calc) (ref <130)
Total CHOL/HDL Ratio: 1.7 (calc) (ref <5.0)
Triglycerides: 63 mg/dL (ref <150)

## 2017-11-09 LAB — COMPLETE METABOLIC PANEL WITH GFR
AG Ratio: 1.1 (calc) (ref 1.0–2.5)
ALT: 46 U/L — ABNORMAL HIGH (ref 6–29)
AST: 62 U/L — ABNORMAL HIGH (ref 10–35)
Albumin: 3.9 g/dL (ref 3.6–5.1)
Alkaline phosphatase (APISO): 101 U/L (ref 33–130)
BUN/Creatinine Ratio: 16 (calc) (ref 6–22)
BUN: 42 mg/dL — ABNORMAL HIGH (ref 7–25)
CO2: 19 mmol/L — ABNORMAL LOW (ref 20–32)
Calcium: 8.6 mg/dL (ref 8.6–10.4)
Chloride: 107 mmol/L (ref 98–110)
Creat: 2.67 mg/dL — ABNORMAL HIGH (ref 0.50–0.99)
GFR, Est African American: 21 mL/min/{1.73_m2} — ABNORMAL LOW (ref >=60)
GFR, Est Non African American: 18 mL/min/{1.73_m2} — ABNORMAL LOW (ref >=60)
Globulin: 3.6 g/dL (calc) (ref 1.9–3.7)
Glucose, Bld: 99 mg/dL (ref 65–99)
Potassium: 4.1 mmol/L (ref 3.5–5.3)
Sodium: 138 mmol/L (ref 135–146)
Total Bilirubin: 0.4 mg/dL (ref 0.2–1.2)
Total Protein: 7.5 g/dL (ref 6.1–8.1)

## 2017-11-10 ENCOUNTER — Other Ambulatory Visit: Payer: Self-pay | Admitting: Nurse Practitioner

## 2017-11-10 DIAGNOSIS — N184 Chronic kidney disease, stage 4 (severe): Secondary | ICD-10-CM

## 2017-11-10 NOTE — Progress Notes (Signed)
Lipid panel is normal.  Continue therapy, healthy diet and exercise.  CMP: worsening kidney function means we will send a referral to kidney specialist as discussed in her appointment.  Liver function slightly elevated.  Will follow with next labs in 3 months.  Does not need treatment at this time.

## 2017-11-16 ENCOUNTER — Other Ambulatory Visit: Payer: Self-pay | Admitting: Nurse Practitioner

## 2017-11-16 DIAGNOSIS — J301 Allergic rhinitis due to pollen: Secondary | ICD-10-CM

## 2017-11-16 MED ORDER — LEVOCETIRIZINE DIHYDROCHLORIDE 5 MG PO TABS: 5.0000 mg | ORAL_TABLET | Freq: Every evening | ORAL | 1 refills | Status: DC

## 2017-11-23 ENCOUNTER — Encounter: Payer: Medicare HMO | Admitting: Nurse Practitioner

## 2017-11-23 ENCOUNTER — Ambulatory Visit: Payer: Medicare HMO

## 2017-12-14 ENCOUNTER — Ambulatory Visit (INDEPENDENT_AMBULATORY_CARE_PROVIDER_SITE_OTHER): Payer: Medicare HMO | Admitting: Nurse Practitioner

## 2017-12-14 ENCOUNTER — Encounter: Payer: Self-pay | Admitting: Nurse Practitioner

## 2017-12-14 ENCOUNTER — Ambulatory Visit (INDEPENDENT_AMBULATORY_CARE_PROVIDER_SITE_OTHER): Payer: Medicare HMO

## 2017-12-14 ENCOUNTER — Other Ambulatory Visit: Payer: Self-pay

## 2017-12-14 VITALS — BP 120/85 | HR 97 | Temp 98.2°F | Ht 61.5 in | Wt 188.0 lb

## 2017-12-14 VITALS — BP 120/85 | HR 97 | Temp 98.2°F | Resp 16 | Ht 61.5 in | Wt 188.0 lb

## 2017-12-14 DIAGNOSIS — Z780 Asymptomatic menopausal state: Secondary | ICD-10-CM

## 2017-12-14 DIAGNOSIS — Z0000 Encounter for general adult medical examination without abnormal findings: Secondary | ICD-10-CM

## 2017-12-14 DIAGNOSIS — Z1231 Encounter for screening mammogram for malignant neoplasm of breast: Secondary | ICD-10-CM

## 2017-12-14 DIAGNOSIS — I129 Hypertensive chronic kidney disease with stage 1 through stage 4 chronic kidney disease, or unspecified chronic kidney disease: Secondary | ICD-10-CM | POA: Diagnosis not present

## 2017-12-14 DIAGNOSIS — Z23 Encounter for immunization: Secondary | ICD-10-CM | POA: Diagnosis not present

## 2017-12-14 DIAGNOSIS — Z1239 Encounter for other screening for malignant neoplasm of breast: Secondary | ICD-10-CM

## 2017-12-14 DIAGNOSIS — N184 Chronic kidney disease, stage 4 (severe): Secondary | ICD-10-CM

## 2017-12-14 NOTE — Patient Instructions (Addendum)
Destiny Adams,   Thank you for coming in to clinic today.  1. You may take Tylenol or acetaminophen for aches and pains.  2. Your kidneys are still getting rid of enough water.  They are not filtering out waste products quite the same as they used to.  You are still getting the management of your electrolytes, which is also good.   Please schedule a follow-up appointment with [name], AGNP. Return for hypertension as scheduled in June.   If you have any other questions or concerns, please feel free to call the clinic or send a message through [place]. You may also schedule an earlier appointment if necessary.  You will receive a survey after today's visit either digitally by e-mail or paper by [employer]. Your experiences and feedback matter to [place].  Please respond so we know how we are doing as we provide care for you.   [name], DNP, AGNP-BC Adult Gerontology Nurse Practitioner [facility], [facility]   Chronic Kidney Disease, Adult Chronic kidney disease (CKD) happens when the kidneys are damaged during a time of 3 or more months. The kidneys are two organs that do many important jobs in the body. These jobs include:  Removing wastes and extra fluids from the blood.  Making hormones that maintain the amount of fluid in your tissues and blood vessels.  Making sure that the body has the right amount of fluids and chemicals.  Most of the time, this condition does not go away, but it can usually be controlled. Steps must be taken to slow down the kidney damage or stop it from getting worse. Otherwise, the kidneys may stop working. Follow these instructions at home:  Follow your diet as told by your doctor. You may need to avoid alcohol, salty foods (sodium), and foods that are high in potassium, calcium, and protein.  Take over-the-counter and prescription medicines only as told by your doctor. Do not take any new medicines unless your doctor says you can do  that. These include vitamins and minerals. ? Medicines and nutritional supplements can make kidney damage worse. ? Your doctor may need to change how much medicine you take.  Do not use any tobacco products. These include cigarettes, chewing tobacco, and e-cigarettes. If you need help quitting, ask your doctor.  Keep all follow-up visits as told by your doctor. This is important.  Check your blood pressure. Tell your doctor if there are changes to your blood pressure.  Get to a healthy weight. Stay at that weight. If you need help with this, ask your doctor.  Start or continue an exercise plan. Try to exercise at least 30 minutes a day, 5 days a week.  Stay up-to-date with your shots (immunizations) as told by your doctor. Contact a doctor if:  Your symptoms get worse.  You have new symptoms. Get help right away if:  You have symptoms of end-stage kidney disease. These include: ? Headaches. ? Skin that is darker or lighter than normal. ? Numbness in your hands or feet. ? Easy bruising. ? Having hiccups often. ? Chest pain. ? Shortness of breath. ? Stopping of menstrual periods in women.  You have a fever.  You are making very little pee (urine).  You have pain or bleeding when you pee (urinate). This information is not intended to replace advice given to you by your health care provider. Make sure you discuss any questions you have with your health care provider. Document Released: 10/28/2009  Document Revised: 01/09/2016 Document Reviewed: 04/01/2012 Elsevier Interactive Patient Education  2017 [employer].

## 2017-12-14 NOTE — Patient Instructions (Addendum)
[name] , Thank you for taking time to come for your Medicare Wellness Visit. I appreciate your ongoing commitment to your health goals. Please review the following plan we discussed and let me know if I can assist you in the future.   Screening recommendations/referrals: Colonoscopy: completed 07/29/2016 Mammogram: Please call [phone] to schedule your mammogram.  Bone Density: Please call [phone] to schedule Recommended yearly ophthalmology/optometry visit for glaucoma screening and checkup Recommended yearly dental visit for hygiene and checkup  Vaccinations: Influenza vaccine: due 04/2018 Pneumococcal vaccine: prevnar 13 done , pneumovax 23 due 12/15/2018 Tdap vaccine: due, check with your insurance company for coverage  Shingles vaccine: eligible, check with your insurance company for coverage    Advanced directives: Advance directive discussed with you today. Even though you declined this today please call our office should you change your mind and we can give you the proper paperwork for you to fill out.  Conditions/risks identified: Recommend drinking at least 6-8 glasses of water a day   Next appointment: Follow up in one year for your annual wellness exam.    Preventive Care 65 Years and Older, Female Preventive care refers to lifestyle choices and visits with your health care provider that can promote health and wellness. What does preventive care include?  A yearly physical exam. This is also called an annual well check.  Dental exams once or twice a year.  Routine eye exams. Ask your health care provider how often you should have your eyes checked.  Personal lifestyle choices, including:  Daily care of your teeth and gums.  Regular physical activity.  Eating a healthy diet.  Avoiding tobacco and drug use.  Limiting alcohol use.  Practicing safe sex.  Taking low-dose aspirin every day.  Taking vitamin and mineral supplements as recommended by  your health care provider. What happens during an annual well check? The services and screenings done by your health care provider during your annual well check will depend on your age, overall health, lifestyle risk factors, and family history of disease. Counseling  Your health care provider may ask you questions about your:  Alcohol use.  Tobacco use.  Drug use.  Emotional well-being.  Home and relationship well-being.  Sexual activity.  Eating habits.  History of falls.  Memory and ability to understand (cognition).  Work and work Statistician.  Reproductive health. Screening  You may have the following tests or measurements:  Height, weight, and BMI.  Blood pressure.  Lipid and cholesterol levels. These may be checked every 5 years, or more frequently if you are over 69 years old.  Skin check.  Lung cancer screening. You may have this screening every year starting at age 7 if you have a 30-pack-year history of smoking and currently smoke or have quit within the past 15 years.  Fecal occult blood test (FOBT) of the stool. You may have this test every year starting at age 16.  Flexible sigmoidoscopy or colonoscopy. You may have a sigmoidoscopy every 5 years or a colonoscopy every 10 years starting at age 65.  Hepatitis C blood test.  Hepatitis B blood test.  Sexually transmitted disease (STD) testing.  Diabetes screening. This is done by checking your blood sugar (glucose) after you have not eaten for a while (fasting). You may have this done every 1-3 years.  Bone density scan. This is done to screen for osteoporosis. You may have this done starting at age 73.  Mammogram. This may be done every 1-2 years. Talk  to your health care provider about how often you should have regular mammograms. Talk with your health care provider about your test results, treatment options, and if necessary, the need for more tests. Vaccines  Your health care provider may  recommend certain vaccines, such as:  Influenza vaccine. This is recommended every year.  Tetanus, diphtheria, and acellular pertussis (Tdap, Td) vaccine. You may need a Td booster every 10 years.  Zoster vaccine. You may need this after age 43.  Pneumococcal 13-valent conjugate (PCV13) vaccine. One dose is recommended after age 67.  Pneumococcal polysaccharide (PPSV23) vaccine. One dose is recommended after age 61. Talk to your health care provider about which screenings and vaccines you need and how often you need them. This information is not intended to replace advice given to you by your health care provider. Make sure you discuss any questions you have with your health care provider. Document Released: 08/30/2015 Document Revised: 04/22/2016 Document Reviewed: 06/04/2015 Elsevier Interactive Patient Education  2017 [place] Prevention in the Home Falls can cause injuries. They can happen to people of all ages. There are many things you can do to make your home safe and to help prevent falls. What can I do on the outside of my home?  Regularly fix the edges of walkways and driveways and fix any cracks.  Remove anything that might make you trip as you walk through a door, such as a raised step or threshold.  Trim any bushes or trees on the path to your home.  Use bright outdoor lighting.  Clear any walking paths of anything that might make someone trip, such as rocks or tools.  Regularly check to see if handrails are loose or broken. Make sure that both sides of any steps have handrails.  Any raised decks and porches should have guardrails on the edges.  Have any leaves, snow, or ice cleared regularly.  Use sand or salt on walking paths during winter.  Clean up any spills in your garage right away. This includes oil or grease spills. What can I do in the bathroom?  Use night lights.  Install grab bars by the toilet and in the tub and shower. Do not use towel  bars as grab bars.  Use non-skid mats or decals in the tub or shower.  If you need to sit down in the shower, use a plastic, non-slip stool.  Keep the floor dry. Clean up any water that spills on the floor as soon as it happens.  Remove soap buildup in the tub or shower regularly.  Attach bath mats securely with double-sided non-slip rug tape.  Do not have throw rugs and other things on the floor that can make you trip. What can I do in the bedroom?  Use night lights.  Make sure that you have a light by your bed that is easy to reach.  Do not use any sheets or blankets that are too big for your bed. They should not hang down onto the floor.  Have a firm chair that has side arms. You can use this for support while you get dressed.  Do not have throw rugs and other things on the floor that can make you trip. What can I do in the kitchen?  Clean up any spills right away.  Avoid walking on wet floors.  Keep items that you use a lot in easy-to-reach places.  If you need to reach something above you, use a strong step stool that  has a grab bar.  Keep electrical cords out of the way.  Do not use floor polish or wax that makes floors slippery. If you must use wax, use non-skid floor wax.  Do not have throw rugs and other things on the floor that can make you trip. What can I do with my stairs?  Do not leave any items on the stairs.  Make sure that there are handrails on both sides of the stairs and use them. Fix handrails that are broken or loose. Make sure that handrails are as long as the stairways.  Check any carpeting to make sure that it is firmly attached to the stairs. Fix any carpet that is loose or worn.  Avoid having throw rugs at the top or bottom of the stairs. If you do have throw rugs, attach them to the floor with carpet tape.  Make sure that you have a light switch at the top of the stairs and the bottom of the stairs. If you do not have them, ask someone to  add them for you. What else can I do to help prevent falls?  Wear shoes that:  Do not have high heels.  Have rubber bottoms.  Are comfortable and fit you well.  Are closed at the toe. Do not wear sandals.  If you use a stepladder:  Make sure that it is fully opened. Do not climb a closed stepladder.  Make sure that both sides of the stepladder are locked into place.  Ask someone to hold it for you, if possible.  Clearly mark and make sure that you can see:  Any grab bars or handrails.  First and last steps.  Where the edge of each step is.  Use tools that help you move around (mobility aids) if they are needed. These include:  Canes.  Walkers.  Scooters.  Crutches.  Turn on the lights when you go into a dark area. Replace any light bulbs as soon as they burn out.  Set up your furniture so you have a clear path. Avoid moving your furniture around.  If any of your floors are uneven, fix them.  If there are any pets around you, be aware of where they are.  Review your medicines with your doctor. Some medicines can make you feel dizzy. This can increase your chance of falling. Ask your doctor what other things that you can do to help prevent falls. This information is not intended to replace advice given to you by your health care provider. Make sure you discuss any questions you have with your health care provider. Document Released: 05/30/2009 Document Revised: 01/09/2016 Document Reviewed: 09/07/2014 Elsevier Interactive Patient Education  2017 [employer].

## 2017-12-14 NOTE — Progress Notes (Signed)
Subjective:   Destiny Adams is a 67 y.o. female who presents for an Initial Medicare Annual Wellness Visit.  Review of Systems       Cardiac Risk Factors include: hypertension;advanced age (>73men, >51 women);obesity (BMI >30kg/m2);dyslipidemia     Objective:    Today's Vitals   12/14/17 1019  BP: 120/85  Pulse: 97  Resp: 16  Temp: 98.2 F (36.8 C)  TempSrc: Oral  Weight: 188 lb (85.3 kg)  Height: 5' 1.5" (1.562 m)   Body mass index is 34.95 kg/m.  Advanced Directives 12/14/2017 07/29/2016  Does Patient Have a Medical Advance Directive? No No  Would patient like information on creating a medical advance directive? Yes (MAU/Ambulatory/Procedural Areas - Information given) No - Patient declined    Current Medications (verified) Outpatient Encounter Medications as of 12/14/2017  Medication Sig  . amLODipine (NORVASC) 10 MG tablet Take 1 tablet (10 mg total) by mouth daily.  Marland Kitchen atorvastatin (LIPITOR) 20 MG tablet Take 1 tablet (20 mg total) by mouth daily.  . fluticasone (FLONASE) 50 MCG/ACT nasal spray Place 2 sprays into both nostrils daily.  Marland Kitchen levocetirizine (XYZAL) 5 MG tablet Take 1 tablet (5 mg total) by mouth every evening.  . loratadine (CLARITIN) 10 MG tablet Take 10 mg by mouth daily.  Marland Kitchen omeprazole (PRILOSEC) 20 MG capsule Take 1 capsule (20 mg total) by mouth daily.   No facility-administered encounter medications on file as of 12/14/2017.     Allergies (verified) Patient has no known allergies.   History: Past Medical History:  Diagnosis Date  . Anemia   . CKD (chronic kidney disease) stage 4, GFR 15-29 ml/min (HCC) 07/23/2017  . GERD (gastroesophageal reflux disease)   . Hyperlipidemia   . Hypertension   . Severe obesity (BMI 35.0-35.9 with comorbidity) ([place]) 07/23/2017   Comorbid CKD stage IV, Hypertension, Hyperlipidemia   Past Surgical History:  Procedure Laterality Date  . COLONOSCOPY WITH PROPOFOL N/A 07/29/2016   Procedure: COLONOSCOPY WITH  PROPOFOL;  Surgeon: Robert Bellow, MD;  Location: [facility] ENDOSCOPY;  Service: Endoscopy;  Laterality: N/A;  . TUBAL LIGATION     Family History  Problem Relation Age of Onset  . Colon cancer Maternal Uncle   . Colon cancer Paternal Uncle   . Breast cancer Paternal Aunt        16  . Alzheimer's disease Mother   . Cancer Father    Social History   Socioeconomic History  . Marital status: Divorced    Spouse name: Not on file  . Number of children: Not on file  . Years of education: Not on file  . Highest education level: Not on file  Occupational History  . Not on file  Social Needs  . Financial resource strain: Not hard at all  . Food insecurity:    Worry: Never true    Inability: Never true  . Transportation needs:    Medical: No    Non-medical: No  Tobacco Use  . Smoking status: Never Smoker  . Smokeless tobacco: Never Used  Substance and Sexual Activity  . Alcohol use: No  . Drug use: No  . Sexual activity: Not on file  Lifestyle  . Physical activity:    Days per week: 0 days    Minutes per session: 0 min  . Stress: Not at all  Relationships  . Social connections:    Talks on phone: More than three times a week    Gets together: More than three times  a week    Attends religious service: More than 4 times per year    Active member of club or organization: No    Attends meetings of clubs or organizations: Never    Relationship status: Divorced  Other Topics Concern  . Not on file  Social History Narrative  . Not on file    Tobacco Counseling Counseling given: Not Answered   Clinical Intake:  Pre-visit preparation completed: Yes  Pain : No/denies pain     Nutritional Status: BMI > 30  Obese Nutritional Risks: None Diabetes: No  How often do you need to have someone help you when you read instructions, pamphlets, or other written materials from your doctor or pharmacy?: 1 - Never What is the last grade level you completed in school?: 11th  grade  Interpreter Needed?: No  Information entered by :: Tiffany Hill,LPN    Activities of Daily Living In your present state of health, do you have any difficulty performing the following activities: 12/14/2017 07/22/2017  Hearing? N N  Vision? N Y  Difficulty concentrating or making decisions? N N  Walking or climbing stairs? N N  Dressing or bathing? N N  Doing errands, shopping? N N  Preparing Food and eating ? N -  Using the Toilet? N -  In the past six months, have you accidently leaked urine? N -  Do you have problems with loss of bowel control? N -  Managing your Medications? N -  Managing your Finances? N -  Housekeeping or managing your Housekeeping? N -  Some recent data might be hidden     Immunizations and Health Maintenance Immunization History  Administered Date(s) Administered  . Pneumococcal Conjugate-13 12/14/2017   Health Maintenance Due  Topic Date Due  . Hepatitis C Screening  Jun 05, 1951  . TETANUS/TDAP  11/22/1968  . MAMMOGRAM  11/23/1999  . DEXA SCAN  11/23/2014  . PNA vac Low Risk Adult (1 of 2 - PCV13) 11/23/2014    Patient Care Team: Mikey College, NP as PCP - General (Nurse Practitioner) Bary Castilla Forest Gleason, MD (General Surgery)  Indicate any recent Medical Services you may have received from other than [facility] providers in the past year (date may be approximate).     Assessment:   This is a routine wellness examination for Destiny Adams.  Hearing/Vision screen Vision Screening Comments: Going to see Dr.Bell annually   Dietary issues and exercise activities discussed: Current Exercise Habits: Home exercise routine, Type of exercise: walking, Intensity: Mild, Exercise limited by: None identified  Goals    . DIET - INCREASE WATER INTAKE     Recommend drinking at least 6-8 glasses of water a day       Depression Screen PHQ 2/9 Scores 12/14/2017 07/22/2017  PHQ - 2 Score 0 0  PHQ- 9 Score 4 -    Fall Risk Fall Risk  12/14/2017  07/22/2017  Falls in the past year? No No    Is the patient's home free of loose throw rugs in walkways, pet beds, electrical cords, etc?   yes      Grab bars in the bathroom? yes      Handrails on the stairs?   no      Adequate lighting?   yes  Timed Get Up and Go Performed Completed in 8 seconds with no use of assistive devices, steady gait. No intervention needed at this time.   Cognitive Function:     6CIT Screen 12/14/2017  What Year? 0 points  What month? 0 points  What time? 0 points  Count back from 20 0 points  Months in reverse 0 points  Repeat phrase 0 points  Total Score 0    Screening Tests Health Maintenance  Topic Date Due  . Hepatitis C Screening  [birth year]  . TETANUS/TDAP  11/22/1968  . MAMMOGRAM  11/23/1999  . DEXA SCAN  11/23/2014  . PNA vac Low Risk Adult (1 of 2 - PCV13) 11/23/2014  . INFLUENZA VACCINE  03/17/2018  . COLONOSCOPY  07/29/2026    Qualifies for Shingles Vaccine? Yes, discussed shingrix vaccine   Cancer Screenings: Lung: Low Dose CT Chest recommended if Age 40-80 years, 30 pack-year currently smoking OR have quit w/in 15years. Patient does not qualify. Breast: Up to date on Mammogram? No  ordered Up to date of Bone Density/Dexa? No ordered Colorectal: completed 07/29/2016  Additional Screenings:  Hepatitis C Screening: will order for next lab draw     Plan:    I have personally reviewed and addressed the Medicare Annual Wellness questionnaire and have noted the following in the patient's chart:  A. Medical and social history B. Use of alcohol, tobacco or illicit drugs  C. Current medications and supplements D. Functional ability and status E.  Nutritional status F.  Physical activity G. Advance directives H. List of other physicians I.  Hospitalizations, surgeries, and ER visits in previous 12 months J.  [place] such as hearing and vision if needed, cognitive and depression L. Referrals and appointments   In  addition, I have reviewed and discussed with patient certain preventive protocols, quality metrics, and best practice recommendations. A written personalized care plan for preventive services as well as general preventive health recommendations were provided to patient.   Signed,  Tyler Aas, LPN Nurse Health Advisor   Nurse Notes:none

## 2017-12-14 NOTE — Progress Notes (Signed)
Subjective:    Patient ID: Destiny Adams, female    DOB: 1950/12/05, 67 y.o.   MRN: [id number]  Destiny Adams is a 67 y.o. female presenting on 12/14/2017 for Chronic Kidney Disease (Concerns about lab results and new CKD stage 4)   HPI CKD Stage 4 Pt with prior labs 07/2016 and repeat in 10/2017.  Labs indicated pt has had progression from CKD stage 3 to CKD stage 4.  Pt admits she has knowledge deficit.  She doesn't understand why she can still have good urine output and have kidney disease.  She states she definitely does not want to have dialysis if it can be prevented.  Social History   Tobacco Use  . Smoking status: Never Smoker  . Smokeless tobacco: Never Used  Substance Use Topics  . Alcohol use: No  . Drug use: No    Review of Systems Per HPI unless specifically indicated above     Objective:    BP 120/85 (BP Location: Right Arm, Patient Position: Sitting, Cuff Size: Normal)   Pulse 97   Temp 98.2 F (36.8 C) (Oral)   Ht 5' 1.5" (1.562 m)   Wt 188 lb (85.3 kg)   BMI 34.95 kg/m   Wt Readings from Last 3 Encounters:  12/14/17 188 lb (85.3 kg)  12/14/17 188 lb (85.3 kg)  11/05/17 189 lb 3.2 oz (85.8 kg)    Physical Exam  Constitutional: She is oriented to person, place, and time. She appears well-developed and well-nourished. No distress.  HENT:  Head: Normocephalic and atraumatic.  Cardiovascular: Normal rate, regular rhythm, S1 normal, S2 normal, normal heart sounds and intact distal pulses.  Pulmonary/Chest: Effort normal and breath sounds normal. No respiratory distress.  Neurological: She is alert and oriented to person, place, and time.  Skin: Skin is warm and dry.  Psychiatric: Her speech is normal and behavior is normal. Judgment and thought content normal. Her mood appears anxious. Cognition and memory are normal.  Vitals reviewed.    Results for orders placed or performed in visit on 11/05/17  COMPLETE METABOLIC PANEL WITH GFR  Result Value Ref  Range   Glucose, Bld 99 65 - 99 mg/dL   BUN 42 (H) 7 - 25 mg/dL   Creat 2.67 (H) 0.50 - 0.99 mg/dL   GFR, Est Non African American 18 (L) > OR = 60 mL/min/1.69m2   GFR, Est African American 21 (L) > OR = 60 mL/min/1.30m2   BUN/Creatinine Ratio 16 6 - 22 (calc)   Sodium 138 135 - 146 mmol/L   Potassium 4.1 3.5 - 5.3 mmol/L   Chloride 107 98 - 110 mmol/L   CO2 19 (L) 20 - 32 mmol/L   Calcium 8.6 8.6 - 10.4 mg/dL   Total Protein 7.5 6.1 - 8.1 g/dL   Albumin 3.9 3.6 - 5.1 g/dL   Globulin 3.6 1.9 - 3.7 g/dL (calc)   AG Ratio 1.1 1.0 - 2.5 (calc)   Total Bilirubin 0.4 0.2 - 1.2 mg/dL   Alkaline phosphatase (APISO) 101 33 - 130 U/L   AST 62 (H) 10 - 35 U/L   ALT 46 (H) 6 - 29 U/L  Lipid panel  Result Value Ref Range   Cholesterol 152 <200 mg/dL   HDL 89 >50 mg/dL   Triglycerides 63 <150 mg/dL   LDL Cholesterol (Calc) 49 mg/dL (calc)   Total CHOL/HDL Ratio 1.7 <5.0 (calc)   Non-HDL Cholesterol (Calc) 63 <130 mg/dL (calc)  Assessment & Plan:   Problem List Items Addressed This Visit    None    Visit Diagnoses    CKD stage 4 secondary to hypertension ([place])    -  Primary    Gradually worsening.  Pt presents labs from 02/2016 she brought from home with GFR = 24.  Pt had recovery between 02/2016 and 07/2016 of GFR improvement from 24 to 31.  10/2017, pt was found to have CKD stage 4 again by labs with GFR = 21.  Referral to nephrology has been made, visit pending.  Plan: 1. Discussed in detail CKD process, staging, causes, prevention of progression (meds [protective and nephrotoxic], HTN, DM control), when dialysis is needed, function of kidney for filtration and management of fluid status.  Pt states all current questions are answered. 2. Reviewed HTN - good control today. 3. Followup with Nephrology and at visit in 2 months as scheduled.   A total of 20 minutes was spent face-to-face with this patient. Greater than 50% of this time was spent in counseling and coordination of care  with the patient as above.    Follow up plan: Return for hypertension as scheduled in June.  [name], DNP, AGPCNP-BC Adult Gerontology Primary Care Nurse Practitioner [facility] 12/14/2017, 1:23 PM

## 2018-01-20 DIAGNOSIS — N179 Acute kidney failure, unspecified: Secondary | ICD-10-CM | POA: Diagnosis not present

## 2018-01-20 DIAGNOSIS — N183 Chronic kidney disease, stage 3 (moderate): Secondary | ICD-10-CM | POA: Diagnosis not present

## 2018-01-20 DIAGNOSIS — R809 Proteinuria, unspecified: Secondary | ICD-10-CM | POA: Diagnosis not present

## 2018-01-20 DIAGNOSIS — I10 Essential (primary) hypertension: Secondary | ICD-10-CM | POA: Diagnosis not present

## 2018-01-24 ENCOUNTER — Ambulatory Visit (INDEPENDENT_AMBULATORY_CARE_PROVIDER_SITE_OTHER): Payer: Medicare HMO | Admitting: Nurse Practitioner

## 2018-01-24 ENCOUNTER — Other Ambulatory Visit: Payer: Self-pay

## 2018-01-24 ENCOUNTER — Encounter: Payer: Self-pay | Admitting: Nurse Practitioner

## 2018-01-24 VITALS — BP 122/82 | HR 105 | Temp 98.3°F | Ht 61.5 in | Wt 192.6 lb

## 2018-01-24 DIAGNOSIS — I10 Essential (primary) hypertension: Secondary | ICD-10-CM

## 2018-01-24 DIAGNOSIS — N184 Chronic kidney disease, stage 4 (severe): Secondary | ICD-10-CM

## 2018-01-24 MED ORDER — METOPROLOL SUCCINATE ER 25 MG PO TB24: 12.5000 mg | ORAL_TABLET | Freq: Every day | ORAL | 0 refills | Status: DC

## 2018-01-24 MED ORDER — METOPROLOL SUCCINATE ER 25 MG PO TB24: 12.5000 mg | ORAL_TABLET | Freq: Every day | ORAL | 3 refills | Status: DC

## 2018-01-24 NOTE — Patient Instructions (Addendum)
Destiny Adams [place],   Thank you for coming in to clinic today.  1. Keep working on stress management during the next few weeks while you wait on your test results.  2. For blood pressure: - Continue amlodipine 10 mg once daily - START metoprolol succinate 25 mg tablet.  Take 1/2 tablet once daily.  This is at [place].  Please schedule a follow-up appointment with [name], AGNP. Return in about 3 months (around 04/26/2018) for AND in 2 weeks for BP check with CMA.  If you have any other questions or concerns, please feel free to call the clinic or send a message through [place]. You may also schedule an earlier appointment if necessary.  You will receive a survey after today's visit either digitally by e-mail or paper by [employer]. Your experiences and feedback matter to [place].  Please respond so we know how we are doing as we provide care for you.   [name], DNP, AGNP-BC Adult Gerontology Nurse Practitioner [place]

## 2018-01-24 NOTE — Assessment & Plan Note (Signed)
Stable on last labs.  Workup ongoing with Dr. Holley Raring ([facility]).  Pt with anxiety today about workup.  Reassurance provided.  Followup as needed and with Dr. Holley Raring.

## 2018-01-24 NOTE — Assessment & Plan Note (Signed)
Currently controlled hypertension at rest.  At initial check, was significantly high with DBP 105.  Pt has BP goal < 130/80 and is w/ known CKD Stage IV.  Pt is currently working on lifestyle modifications for improving diet.  Worsening BP today after acute stress from upcoming / unknown medical test results. -Complications: CKD, obesity  Plan: 1. Continue taking amlodipine without changes today. - START metoprolol XL 25 mg tablet.  Take 1/2 tablet daily. 2. Labs recently checked with Dr. Holley Raring - previously stable 3. Encouraged heart healthy diet and increasing exercise to 30 minutes most days of the week. 4. Check BP 1-2 x per week at home, keep log, and bring to clinic at next appointment. Goal < 130/80.  Pt verbalized understanding she will call if remains above goal. 5. Follow up 3 months AND in 2 weeks with CMA check of BP.

## 2018-01-24 NOTE — Progress Notes (Signed)
Subjective:    Patient ID: Destiny Adams, female    DOB: May 13, 1951, 67 y.o.   MRN: [id number]  Destiny Adams is a 67 y.o. female presenting on 01/24/2018 for Hypertension   HPI Hypertension - She is not checking BP at home or outside of clinic.    - Current medications: amlodipine 10 mg once daily, tolerating well without side effects - She is not currently symptomatic. - Pt denies headache, lightheadedness, dizziness, changes in vision, chest tightness/pressure, palpitations, leg swelling, sudden loss of speech or loss of consciousness. - She  reports no regular exercise routine. - Her diet is low in salt, moderate in fat, and moderate in carbohydrates.   CKD Stage 4: Pt currently connected with Dr. Holley Raring at [facility]. He has ordered labs, renal US, and possibly a renal biopsy.  No notes are available, so this is from pt report of her visit with him on 01/20/2018. - Pt continues to have anxiety about these upcoming tests and is often tearful over last 3 days.  Social History   Tobacco Use  . Smoking status: Never Smoker  . Smokeless tobacco: Never Used  Substance Use Topics  . Alcohol use: No  . Drug use: No    Review of Systems Per HPI unless specifically indicated above     Objective:    BP 122/82 (BP Location: Left Arm, Patient Position: Sitting, Cuff Size: Large)   Pulse (!) 105   Temp 98.3 F (36.8 C) (Oral)   Ht 5' 1.5" (1.562 m)   Wt 192 lb 9.6 oz (87.4 kg)   BMI 35.80 kg/m   Wt Readings from Last 3 Encounters:  01/24/18 192 lb 9.6 oz (87.4 kg)  12/14/17 188 lb (85.3 kg)  12/14/17 188 lb (85.3 kg)    Physical Exam  Constitutional: She is oriented to person, place, and time. She appears well-developed and well-nourished. No distress.  HENT:  Head: Normocephalic and atraumatic.  Cardiovascular: Normal rate, regular rhythm, S1 normal, S2 normal, normal heart sounds and intact distal pulses.  Pulmonary/Chest: Effort normal and breath sounds  normal. No respiratory distress.  Neurological: She is alert and oriented to person, place, and time.  Mild tremor today  Skin: Skin is warm and dry. Capillary refill takes less than 2 seconds.  Psychiatric: Her behavior is normal. Judgment and thought content normal. Her mood appears anxious. Her speech is rapid and/or pressured and tangential. Cognition and memory are normal.  Vitals reviewed.  Results for orders placed or performed in visit on 11/05/17  COMPLETE METABOLIC PANEL WITH GFR  Result Value Ref Range   Glucose, Bld 99 65 - 99 mg/dL   BUN 42 (H) 7 - 25 mg/dL   Creat 2.67 (H) 0.50 - 0.99 mg/dL   GFR, Est Non African American 18 (L) > OR = 60 mL/min/1.110m2   GFR, Est African American 21 (L) > OR = 60 mL/min/1.20m2   BUN/Creatinine Ratio 16 6 - 22 (calc)   Sodium 138 135 - 146 mmol/L   Potassium 4.1 3.5 - 5.3 mmol/L   Chloride 107 98 - 110 mmol/L   CO2 19 (L) 20 - 32 mmol/L   Calcium 8.6 8.6 - 10.4 mg/dL   Total Protein 7.5 6.1 - 8.1 g/dL   Albumin 3.9 3.6 - 5.1 g/dL   Globulin 3.6 1.9 - 3.7 g/dL (calc)   AG Ratio 1.1 1.0 - 2.5 (calc)   Total Bilirubin 0.4 0.2 - 1.2 mg/dL   Alkaline phosphatase (  APISO) 101 33 - 130 U/L   AST 62 (H) 10 - 35 U/L   ALT 46 (H) 6 - 29 U/L  Lipid panel  Result Value Ref Range   Cholesterol 152 <200 mg/dL   HDL 89 >50 mg/dL   Triglycerides 63 <150 mg/dL   LDL Cholesterol (Calc) 49 mg/dL (calc)   Total CHOL/HDL Ratio 1.7 <5.0 (calc)   Non-HDL Cholesterol (Calc) 63 <130 mg/dL (calc)      Assessment & Plan:   Problem List Items Addressed This Visit      Cardiovascular and Mediastinum   Hypertension - Primary    Currently controlled hypertension at rest.  At initial check, was significantly high with DBP 105.  Pt has BP goal < 130/80 and is w/ known CKD Stage IV.  Pt is currently working on lifestyle modifications for improving diet.  Worsening BP today after acute stress from upcoming / unknown medical test results. -Complications: CKD,  obesity  Plan: 1. Continue taking amlodipine without changes today. - START metoprolol XL 25 mg tablet.  Take 1/2 tablet daily. 2. Labs recently checked with Dr. Holley Raring - previously stable 3. Encouraged heart healthy diet and increasing exercise to 30 minutes most days of the week. 4. Check BP 1-2 x per week at home, keep log, and bring to clinic at next appointment. Goal < 130/80.  Pt verbalized understanding she will call if remains above goal. 5. Follow up 3 months AND in 2 weeks with CMA check of BP.       Relevant Medications   metoprolol succinate (TOPROL-XL) 25 MG 24 hr tablet (Start on 02/07/2018)   metoprolol succinate (TOPROL-XL) 25 MG 24 hr tablet     Genitourinary   CKD (chronic kidney disease) stage 4, GFR 15-29 ml/min (HCC)    Stable on last labs.  Workup ongoing with Dr. Holley Raring ([facility]).  Pt with anxiety today about workup.  Reassurance provided.  Followup as needed and with Dr. Holley Raring.         Meds ordered this encounter  Medications  . metoprolol succinate (TOPROL-XL) 25 MG 24 hr tablet    Sig: Take 0.5 tablets (12.5 mg total) by mouth daily.    Dispense:  45 tablet    Refill:  3    Order Specific Question:   Supervising Provider    Answer:   Olin Hauser [[id number]]  . metoprolol succinate (TOPROL-XL) 25 MG 24 hr tablet    Sig: Take 0.5 tablets (12.5 mg total) by mouth daily.    Dispense:  15 tablet    Refill:  0    Order Specific Question:   Supervising Provider    Answer:   Olin Hauser [[id number]]    Follow up plan: Return in about 3 months (around 04/26/2018) for AND in 2 weeks for BP check with CMA.  [name], DNP, AGPCNP-BC Adult Gerontology Primary Care Nurse Practitioner [facility] 01/24/2018, 2:23 PM

## 2018-01-26 ENCOUNTER — Other Ambulatory Visit: Payer: Self-pay | Admitting: Nephrology

## 2018-01-26 DIAGNOSIS — N183 Chronic kidney disease, stage 3 unspecified: Secondary | ICD-10-CM

## 2018-02-07 ENCOUNTER — Ambulatory Visit: Payer: Medicare HMO

## 2018-02-07 ENCOUNTER — Ambulatory Visit
Admission: RE | Admit: 2018-02-07 | Discharge: 2018-02-07 | Disposition: A | Discharge status: Home / Self Care | Payer: Medicare HMO | Source: Ambulatory Visit | Attending: Nephrology | Admitting: Nephrology

## 2018-02-07 DIAGNOSIS — N183 Chronic kidney disease, stage 3 unspecified: Secondary | ICD-10-CM

## 2018-02-07 DIAGNOSIS — N281 Cyst of kidney, acquired: Secondary | ICD-10-CM | POA: Diagnosis not present

## 2018-02-07 DIAGNOSIS — I10 Essential (primary) hypertension: Secondary | ICD-10-CM | POA: Diagnosis not present

## 2018-02-07 DIAGNOSIS — R809 Proteinuria, unspecified: Secondary | ICD-10-CM | POA: Diagnosis not present

## 2018-02-21 DIAGNOSIS — R809 Proteinuria, unspecified: Secondary | ICD-10-CM | POA: Diagnosis not present

## 2018-02-21 DIAGNOSIS — N183 Chronic kidney disease, stage 3 (moderate): Secondary | ICD-10-CM | POA: Diagnosis not present

## 2018-02-21 DIAGNOSIS — I10 Essential (primary) hypertension: Secondary | ICD-10-CM | POA: Diagnosis not present

## 2018-03-04 DIAGNOSIS — N179 Acute kidney failure, unspecified: Secondary | ICD-10-CM | POA: Diagnosis not present

## 2018-03-04 DIAGNOSIS — I10 Essential (primary) hypertension: Secondary | ICD-10-CM | POA: Diagnosis not present

## 2018-03-04 DIAGNOSIS — N183 Chronic kidney disease, stage 3 (moderate): Secondary | ICD-10-CM | POA: Diagnosis not present

## 2018-03-04 DIAGNOSIS — N2581 Secondary hyperparathyroidism of renal origin: Secondary | ICD-10-CM | POA: Diagnosis not present

## 2018-03-09 ENCOUNTER — Other Ambulatory Visit: Payer: Self-pay | Admitting: Nephrology

## 2018-03-09 DIAGNOSIS — N183 Chronic kidney disease, stage 3 unspecified: Secondary | ICD-10-CM

## 2018-03-09 DIAGNOSIS — R809 Proteinuria, unspecified: Secondary | ICD-10-CM

## 2018-03-09 DIAGNOSIS — N179 Acute kidney failure, unspecified: Secondary | ICD-10-CM

## 2018-03-14 ENCOUNTER — Ambulatory Visit (INDEPENDENT_AMBULATORY_CARE_PROVIDER_SITE_OTHER): Payer: Medicare HMO | Admitting: Gastroenterology

## 2018-03-14 ENCOUNTER — Encounter (INDEPENDENT_AMBULATORY_CARE_PROVIDER_SITE_OTHER): Payer: Self-pay

## 2018-03-14 ENCOUNTER — Other Ambulatory Visit
Admission: RE | Admit: 2018-03-14 | Discharge: 2018-03-14 | Disposition: A | Discharge status: Home / Self Care | Payer: Medicare HMO | Source: Ambulatory Visit | Attending: Gastroenterology | Admitting: Gastroenterology

## 2018-03-14 ENCOUNTER — Encounter: Payer: Self-pay | Admitting: Gastroenterology

## 2018-03-14 VITALS — BP 141/86 | HR 102 | Ht 61.5 in | Wt 190.5 lb

## 2018-03-14 DIAGNOSIS — B182 Chronic viral hepatitis C: Secondary | ICD-10-CM

## 2018-03-14 LAB — HEPATIC FUNCTION PANEL
ALBUMIN: 4.2 g/dL (ref 3.5–5.0)
ALK PHOS: 113 U/L (ref 38–126)
ALT: 26 U/L (ref 0–44)
AST: 35 U/L (ref 15–41)
Bilirubin, Direct: 0.2 mg/dL (ref 0.0–0.2)
Indirect Bilirubin: 0.4 mg/dL (ref 0.3–0.9)
TOTAL PROTEIN: 9.2 g/dL — AB (ref 6.5–8.1)
Total Bilirubin: 0.6 mg/dL (ref 0.3–1.2)

## 2018-03-14 LAB — PROTIME-INR
INR: 0.96
Prothrombin Time: 12.7 seconds (ref 11.4–15.2)

## 2018-03-14 NOTE — Patient Instructions (Signed)
You are scheduled for an US abdomen with elastography at [facility] on Tuesday, August 6th @ 9:00am. Please arrive at the medical mall registration desk at 8:45am. You cannot have anything to eat or drink after midnight on Monday night.   If you need to reschedule this appointment for any reason, please contact central scheduling at [phone].

## 2018-03-14 NOTE — Progress Notes (Signed)
Gastroenterology Consultation  Referring Provider:     [facility] [place] [facility] Primary Care Physician:  Mikey College, NP Primary Gastroenterologist:  Dr. Allen Norris     Reason for Consultation:     Abnormal enzymes        HPI:   Destiny Adams is a 67 y.o. y/o female referred for consultation & management of abnormal liver enzymes by Dr. Merrilyn Puma, Jerrel Ivory, NP.  This patient comes in today after reporting that she has the only high risk activity for viral hepatitis of being transfused with blood before the 1990s.  The patient denies any cocaine use homemade tattoos or IV drugs.  The patient had blood work sent off by her nephrologist and was shown to have a hepatitis C antibody positive with a positive viral load.  The patient was also found to have hepatitis B core antibody positive with a surface antigen negative.  There is no report of any jaundice or abdominal pain.  The patient had a colonoscopy in 2017 by a local surgeon. There is no report of any imaging showing any advanced liver disease.  I am now being asked to see the patient for her hepatitis C.  Past Medical History:  Diagnosis Date  . Anemia   . CKD (chronic kidney disease) stage 4, GFR 15-29 ml/min (HCC) 07/23/2017  . GERD (gastroesophageal reflux disease)   . Hyperlipidemia   . Hypertension   . Severe obesity (BMI 35.0-35.9 with comorbidity) ([place]) 07/23/2017   Comorbid CKD stage IV, Hypertension, Hyperlipidemia    Past Surgical History:  Procedure Laterality Date  . COLONOSCOPY WITH PROPOFOL N/A 07/29/2016   Procedure: COLONOSCOPY WITH PROPOFOL;  Surgeon: Robert Bellow, MD;  Location: [facility] ENDOSCOPY;  Service: Endoscopy;  Laterality: N/A;  . TUBAL LIGATION      Prior to Admission medications   Medication Sig Start Date End Date Taking? Authorizing Provider  amLODipine (NORVASC) 10 MG tablet Take 1 tablet (10 mg total) by mouth daily. 09/13/17   Mikey College, NP  atorvastatin (LIPITOR) 20 MG  tablet Take 1 tablet (20 mg total) by mouth daily. 09/13/17   Mikey College, NP  fluticasone (FLONASE) 50 MCG/ACT nasal spray Place 2 sprays into both nostrils daily. 11/05/17   Mikey College, NP  levocetirizine (XYZAL) 5 MG tablet Take 1 tablet (5 mg total) by mouth every evening. 11/16/17   Mikey College, NP  loratadine (CLARITIN) 10 MG tablet Take 10 mg by mouth daily.    [provider]  metoprolol succinate (TOPROL-XL) 25 MG 24 hr tablet Take 0.5 tablets (12.5 mg total) by mouth daily. 02/07/18   Mikey College, NP  metoprolol succinate (TOPROL-XL) 25 MG 24 hr tablet Take 0.5 tablets (12.5 mg total) by mouth daily. 01/24/18   Mikey College, NP  omeprazole (PRILOSEC) 20 MG capsule Take 1 capsule (20 mg total) by mouth daily. 11/05/17   Mikey College, NP    Family History  Problem Relation Age of Onset  . Colon cancer Maternal Uncle   . Colon cancer Paternal Uncle   . Breast cancer Paternal Aunt        3  . Alzheimer's disease Mother   . Cancer Father      Social History   Tobacco Use  . Smoking status: Never Smoker  . Smokeless tobacco: Never Used  Substance Use Topics  . Alcohol use: No  . Drug use: No    Allergies as of 03/14/2018  . (  No Known Allergies)    Review of Systems:    All systems reviewed and negative except where noted in HPI.   Physical Exam:  BP (!) 141/86   Pulse (!) 102   Ht 5' 1.5" (1.562 m)   Wt 190 lb 8 oz (86.4 kg)   BMI 35.41 kg/m  No LMP recorded. Patient is postmenopausal. General:   Alert,  Well-developed, well-nourished, pleasant and cooperative in NAD Head:  Normocephalic and atraumatic. Eyes:  Sclera clear, no icterus.   Conjunctiva pink. Ears:  Normal auditory acuity. Nose:  No deformity, discharge, or lesions. Mouth:  No deformity or lesions,oropharynx pink & moist. Neck:  Supple; no masses or thyromegaly. Lungs:  Respirations even and unlabored.  Clear throughout to auscultation.    No wheezes, crackles, or rhonchi. No acute distress. Heart:  Regular rate and rhythm; no murmurs, clicks, rubs, or gallops. Abdomen:  Normal bowel sounds.  No bruits.  Soft, non-tender and non-distended without masses, hepatosplenomegaly or hernias noted.  No guarding or rebound tenderness.  Negative Carnett sign.   Rectal:  Deferred.  Msk:  Symmetrical without gross deformities.  Good, equal movement & strength bilaterally. Pulses:  Normal pulses noted. Extremities:  No clubbing or edema.  No cyanosis. Neurologic:  Alert and oriented x3;  grossly normal neurologically. Skin:  Intact without significant lesions or rashes.  No jaundice. Lymph Nodes:  No significant cervical adenopathy. Psych:  Alert and cooperative. Normal mood and affect.  Imaging Studies: No results found.  Assessment and Plan:   Destiny Adams is a 67 y.o. y/o female who comes in with a finding of hepatitis C antibody positive with a positive viral load and hepatitis B core antibody with the hepatitis B surface antigen being negative.  The patient will have her labs sent off for hepatitis B surface antibody and hepatitis A antibody to see if she is immune to and whether she needs vaccinations.  The patient will also have blood work done for other possible causes of abnormal liver enzymes including having her liver enzymes rechecked.  The patient will also be sent off for a liver elastography to make sure that there is no underlying cirrhosis.  Pending the results of these test the patient will be started on treatment for her hepatitis C.  The patient has been explained the plan and agrees with it.  Lucilla Lame, MD. Marval Regal    Note: This dictation was prepared with Dragon dictation along with smaller phrase technology. Any transcriptional errors that result from this process are unintentional.

## 2018-03-15 ENCOUNTER — Other Ambulatory Visit: Payer: Self-pay | Admitting: Student

## 2018-03-15 LAB — MITOCHONDRIAL ANTIBODIES: Mitochondrial M2 Ab, IgG: <20 Units (ref 0.0–20.0)

## 2018-03-15 LAB — ALPHA-1-ANTITRYPSIN: A1 ANTITRYPSIN SER: 144 mg/dL (ref 90–200)

## 2018-03-15 LAB — ANTI-SMOOTH MUSCLE ANTIBODY, IGG: F-ACTIN AB IGG: 14 U (ref 0–19)

## 2018-03-15 LAB — CERULOPLASMIN: Ceruloplasmin: 27.1 mg/dL (ref 19.0–39.0)

## 2018-03-15 LAB — ANA: Anti Nuclear Antibody(ANA): POSITIVE — AB

## 2018-03-15 LAB — HEPATITIS A ANTIBODY, TOTAL: Hep A Total Ab: NEGATIVE

## 2018-03-15 LAB — HEPATITIS B SURFACE ANTIBODY,QUALITATIVE: Hep B S Ab: NONREACTIVE

## 2018-03-16 ENCOUNTER — Other Ambulatory Visit (HOSPITAL_COMMUNITY): Payer: Self-pay | Admitting: Interventional Radiology

## 2018-03-16 ENCOUNTER — Ambulatory Visit
Admission: RE | Admit: 2018-03-16 | Discharge: 2018-03-16 | Disposition: A | Discharge status: Home / Self Care | Payer: Medicare HMO | Source: Ambulatory Visit | Attending: Nephrology | Admitting: Nephrology

## 2018-03-16 ENCOUNTER — Encounter
Admission: RE | Admit: 2018-03-16 | Discharge: 2018-03-16 | Disposition: A | Discharge status: Home / Self Care | Payer: Medicare HMO | Source: Ambulatory Visit | Attending: Nephrology | Admitting: Nephrology

## 2018-03-16 DIAGNOSIS — E785 Hyperlipidemia, unspecified: Secondary | ICD-10-CM | POA: Diagnosis not present

## 2018-03-16 DIAGNOSIS — R809 Proteinuria, unspecified: Secondary | ICD-10-CM | POA: Diagnosis not present

## 2018-03-16 DIAGNOSIS — I129 Hypertensive chronic kidney disease with stage 1 through stage 4 chronic kidney disease, or unspecified chronic kidney disease: Secondary | ICD-10-CM | POA: Diagnosis not present

## 2018-03-16 DIAGNOSIS — N183 Chronic kidney disease, stage 3 unspecified: Secondary | ICD-10-CM

## 2018-03-16 DIAGNOSIS — N179 Acute kidney failure, unspecified: Secondary | ICD-10-CM

## 2018-03-16 DIAGNOSIS — Z7951 Long term (current) use of inhaled steroids: Secondary | ICD-10-CM | POA: Diagnosis not present

## 2018-03-16 DIAGNOSIS — Z79899 Other long term (current) drug therapy: Secondary | ICD-10-CM | POA: Insufficient documentation

## 2018-03-16 DIAGNOSIS — N189 Chronic kidney disease, unspecified: Secondary | ICD-10-CM | POA: Diagnosis not present

## 2018-03-16 DIAGNOSIS — K219 Gastro-esophageal reflux disease without esophagitis: Secondary | ICD-10-CM | POA: Insufficient documentation

## 2018-03-16 DIAGNOSIS — Z6835 Body mass index (BMI) 35.0-35.9, adult: Secondary | ICD-10-CM | POA: Insufficient documentation

## 2018-03-16 DIAGNOSIS — N289 Disorder of kidney and ureter, unspecified: Secondary | ICD-10-CM | POA: Diagnosis not present

## 2018-03-16 DIAGNOSIS — I120 Hypertensive chronic kidney disease with stage 5 chronic kidney disease or end stage renal disease: Secondary | ICD-10-CM | POA: Diagnosis not present

## 2018-03-16 DIAGNOSIS — N184 Chronic kidney disease, stage 4 (severe): Secondary | ICD-10-CM | POA: Diagnosis not present

## 2018-03-16 DIAGNOSIS — D649 Anemia, unspecified: Secondary | ICD-10-CM | POA: Insufficient documentation

## 2018-03-16 LAB — CBC WITH DIFFERENTIAL/PLATELET
BASOS ABS: 0.1 10*3/uL (ref 0–0.1)
BASOS PCT: 1 %
EOS PCT: 10 %
Eosinophils Absolute: 1.1 10*3/uL — ABNORMAL HIGH (ref 0–0.7)
HEMATOCRIT: 34.6 % — AB (ref 35.0–47.0)
Hemoglobin: 12.2 g/dL (ref 12.0–16.0)
LYMPHS PCT: 31 %
Lymphs Abs: 3.2 10*3/uL (ref 1.0–3.6)
MCH: 32.7 pg (ref 26.0–34.0)
MCHC: 35.2 g/dL (ref 32.0–36.0)
MCV: 92.9 fL (ref 80.0–100.0)
MONOS PCT: 10 %
Monocytes Absolute: 1.1 10*3/uL — ABNORMAL HIGH (ref 0.2–0.9)
NEUTROS ABS: 4.8 10*3/uL (ref 1.4–6.5)
Neutrophils Relative %: 48 %
PLATELETS: 274 10*3/uL (ref 150–440)
RBC: 3.73 MIL/uL — ABNORMAL LOW (ref 3.80–5.20)
RDW: 13 % (ref 11.5–14.5)
WBC: 10.3 10*3/uL (ref 3.6–11.0)

## 2018-03-16 LAB — COMPREHENSIVE METABOLIC PANEL
ALT: 23 U/L (ref 0–44)
ANION GAP: 10 (ref 5–15)
AST: 31 U/L (ref 15–41)
Albumin: 4.3 g/dL (ref 3.5–5.0)
Alkaline Phosphatase: 103 U/L (ref 38–126)
BILIRUBIN TOTAL: 0.7 mg/dL (ref 0.3–1.2)
BUN: 57 mg/dL — ABNORMAL HIGH (ref 8–23)
CO2: 18 mmol/L — AB (ref 22–32)
CREATININE: 3.73 mg/dL — AB (ref 0.44–1.00)
Calcium: 9.2 mg/dL (ref 8.9–10.3)
Chloride: 108 mmol/L (ref 98–111)
GFR calc non Af Amer: 12 mL/min — ABNORMAL LOW (ref >60)
GFR, EST AFRICAN AMERICAN: 13 mL/min — AB (ref >60)
Glucose, Bld: 118 mg/dL — ABNORMAL HIGH (ref 70–99)
Potassium: 3.4 mmol/L — ABNORMAL LOW (ref 3.5–5.1)
Sodium: 136 mmol/L (ref 135–145)
TOTAL PROTEIN: 9.2 g/dL — AB (ref 6.5–8.1)

## 2018-03-16 LAB — PROTEIN / CREATININE RATIO, URINE
Creatinine, Urine: 166 mg/dL
PROTEIN CREATININE RATIO: 0.99 mg/mg{creat} — AB (ref 0.00–0.15)
TOTAL PROTEIN, URINE: 164 mg/dL

## 2018-03-16 LAB — TYPE AND SCREEN
ABO/RH(D): O POS
Antibody Screen: NEGATIVE

## 2018-03-16 LAB — URINALYSIS, COMPLETE (UACMP) WITH MICROSCOPIC
BACTERIA UA: NONE SEEN
Bilirubin Urine: NEGATIVE
GLUCOSE, UA: NEGATIVE mg/dL
HGB URINE DIPSTICK: NEGATIVE
Ketones, ur: NEGATIVE mg/dL
LEUKOCYTES UA: NEGATIVE
NITRITE: NEGATIVE
PH: 5 (ref 5.0–8.0)
Protein, ur: 100 mg/dL — AB
SPECIFIC GRAVITY, URINE: 1.013 (ref 1.005–1.030)

## 2018-03-16 MED ORDER — SODIUM CHLORIDE 0.9 % IV SOLN
INTRAVENOUS | Status: DC
  Administered 2018-03-16: 09:00:00 via INTRAVENOUS

## 2018-03-16 MED ORDER — MIDAZOLAM HCL 5 MG/5ML IJ SOLN
INTRAMUSCULAR | Status: AC | PRN
  Administered 2018-03-16: 1 mg via INTRAVENOUS
  Administered 2018-03-16 (×2): 0.5 mg via INTRAVENOUS

## 2018-03-16 MED ORDER — FENTANYL CITRATE (PF) 100 MCG/2ML IJ SOLN
INTRAMUSCULAR | Status: AC
  Filled 2018-03-16: qty 4

## 2018-03-16 MED ORDER — MIDAZOLAM HCL 5 MG/5ML IJ SOLN
INTRAMUSCULAR | Status: AC
  Filled 2018-03-16: qty 5

## 2018-03-16 MED ORDER — FENTANYL CITRATE (PF) 100 MCG/2ML IJ SOLN
INTRAMUSCULAR | Status: AC | PRN
  Administered 2018-03-16: 25 ug via INTRAVENOUS
  Administered 2018-03-16: 50 ug via INTRAVENOUS

## 2018-03-16 NOTE — Discharge Instructions (Signed)
Moderate Conscious Sedation, Adult, Care After These instructions provide you with information about caring for yourself after your procedure. Your health care provider may also give you more specific instructions. Your treatment has been planned according to current medical practices, but problems sometimes occur. Call your health care provider if you have any problems or questions after your procedure. What can I expect after the procedure? After your procedure, it is common:  To feel sleepy for several hours.  To feel clumsy and have poor balance for several hours.  To have poor judgment for several hours.  To vomit if you eat too soon.  Follow these instructions at home: For at least 24 hours after the procedure:   Do not: ? Participate in activities where you could fall or become injured. ? Drive. ? Use heavy machinery. ? Drink alcohol. ? Take sleeping pills or medicines that cause drowsiness. ? Make important decisions or sign legal documents. ? Take care of children on your own.  Rest. Eating and drinking  Follow the diet recommended by your health care provider.  If you vomit: ? Drink water, juice, or soup when you can drink without vomiting. ? Make sure you have little or no nausea before eating solid foods. General instructions  Have a responsible adult stay with you until you are awake and alert.  Take over-the-counter and prescription medicines only as told by your health care provider.  If you smoke, do not smoke without supervision.  Keep all follow-up visits as told by your health care provider. This is important. Contact a health care provider if:  You keep feeling nauseous or you keep vomiting.  You feel light-headed.  You develop a rash.  You have a fever. Get help right away if:  You have trouble breathing. This information is not intended to replace advice given to you by your health care provider. Make sure you discuss any questions you have  with your health care provider. Document Released: 05/24/2013 Document Revised: 01/06/2016 Document Reviewed: 11/23/2015 Elsevier Interactive Patient Education  2018 [place]. Percutaneous Kidney Biopsy, Care After This sheet gives you information about how to care for yourself after your procedure. Your health care provider may also give you more specific instructions. If you have problems or questions, contact your health care provider. What can I expect after the procedure? After the procedure, it is common to have:  Pain or soreness near the area where the needle went through your skin (biopsy site).  Bright pink or cloudy urine for 24 hours after the procedure.  Follow these instructions at home: Activity  Return to your normal activities as told by your health care provider. Ask your health care provider what activities are safe for you.  Do not drive for 24 hours if you were given a medicine to help you relax (sedative).  Do not lift anything that is heavier than 10 lb (4.5 kg) until your health care provider tells you that it is safe.  Avoid activities that take a lot of effort (are strenuous) until your health care provider approves. Most people will have to wait 2 weeks before returning to activities such as exercise or sexual intercourse. General instructions  Take over-the-counter and prescription medicines only as told by your health care provider.  You may eat and drink after your procedure. Follow instructions from your health care provider about eating or drinking restrictions.  Check your biopsy site every day for signs of infection. Check for: ? More redness, swelling, or  pain. ? More fluid or blood. ? Warmth. ? Pus or a bad smell.  Keep all follow-up visits as told by your health care provider. This is important. Contact a health care provider if:  You have more redness, swelling, or pain around your biopsy site.  You have more fluid or blood coming from  your biopsy site.  Your biopsy site feels warm to the touch.  You have pus or a bad smell coming from your biopsy site.  You have blood in your urine more than 24 hours after your procedure. Get help right away if:  You have dark red or brown urine.  You have a fever.  You are unable to urinate.  You feel burning when you urinate.  You feel faint.  You have severe pain in your abdomen or side. This information is not intended to replace advice given to you by your health care provider. Make sure you discuss any questions you have with your health care provider. Document Released: 04/05/2013 Document Revised: 05/15/2016 Document Reviewed: 05/15/2016 Elsevier Interactive Patient Education  [employer].

## 2018-03-16 NOTE — Consult Note (Signed)
Chief Complaint: Renal Insufficiency  Referring Physician(s): Lateef,Munsoor  Patient Status: [facility] - Out-pt  History of Present Illness: Destiny Adams is a 67 y.o. female with past medical history significant for hypertension, hyperlipidemia, obesity, anemia and chronic stage IV kidney disease who presents today for ultrasound-guided random renal biopsy for tissue diagnostic purposes.  The patient is unaccompanied and serves as her own historian.  Patient is in her baseline state of health and is currently without complaint.    Specifically, no chest pain, fevers, cough or shortness of breath.    Past Medical History:  Diagnosis Date  . Anemia   . CKD (chronic kidney disease) stage 4, GFR 15-29 ml/min (HCC) 07/23/2017  . GERD (gastroesophageal reflux disease)   . Hyperlipidemia   . Hypertension   . Severe obesity (BMI 35.0-35.9 with comorbidity) ([place]) 07/23/2017   Comorbid CKD stage IV, Hypertension, Hyperlipidemia    Past Surgical History:  Procedure Laterality Date  . COLONOSCOPY WITH PROPOFOL N/A 07/29/2016   Procedure: COLONOSCOPY WITH PROPOFOL;  Surgeon: Robert Bellow, MD;  Location[facility] ENDOSCOPY;  Service: Endoscopy;  Laterality: N/A;  . TUBAL LIGATION      Allergies: Patient has no known allergies.  Medications: Prior to Admission medications   Medication Sig Start Date End Date Taking? Authorizing Provider  amLODipine (NORVASC) 10 MG tablet Take 1 tablet (10 mg total) by mouth daily. 09/13/17  Yes Mikey College, NP  atorvastatin (LIPITOR) 20 MG tablet Take 1 tablet (20 mg total) by mouth daily. 09/13/17  Yes Mikey College, NP  fluticasone (FLONASE) 50 MCG/ACT nasal spray Place 2 sprays into both nostrils daily. 11/05/17  Yes Mikey College, NP  levocetirizine (XYZAL) 5 MG tablet Take 1 tablet (5 mg total) by mouth every evening. 11/16/17  Yes Mikey College, NP  loratadine (CLARITIN) 10 MG tablet Take 10 mg by mouth daily.    Yes [provider]  metoprolol succinate (TOPROL-XL) 25 MG 24 hr tablet Take 0.5 tablets (12.5 mg total) by mouth daily. 02/07/18  Yes Mikey College, NP  omeprazole (PRILOSEC) 20 MG capsule Take 1 capsule (20 mg total) by mouth daily. 11/05/17  Yes Mikey College, NP  metoprolol succinate (TOPROL-XL) 25 MG 24 hr tablet Take 0.5 tablets (12.5 mg total) by mouth daily. 01/24/18   Mikey College, NP     Family History  Problem Relation Age of Onset  . Colon cancer Maternal Uncle   . Colon cancer Paternal Uncle   . Breast cancer Paternal Aunt        3  . Alzheimer's disease Mother   . Cancer Father     Social History   Socioeconomic History  . Marital status: Divorced    Spouse name: Not on file  . Number of children: Not on file  . Years of education: Not on file  . Highest education level: Not on file  Occupational History  . Not on file  Social Needs  . Financial resource strain: Not hard at all  . Food insecurity:    Worry: Never true    Inability: Never true  . Transportation needs:    Medical: No    Non-medical: No  Tobacco Use  . Smoking status: Never Smoker  . Smokeless tobacco: Never Used  Substance and Sexual Activity  . Alcohol use: No  . Drug use: No  . Sexual activity: Not on file  Lifestyle  . Physical activity:    Days per week: 0  days    Minutes per session: 0 min  . Stress: Not at all  Relationships  . Social connections:    Talks on phone: More than three times a week    Gets together: More than three times a week    Attends religious service: More than 4 times per year    Active member of club or organization: No    Attends meetings of clubs or organizations: Never    Relationship status: Divorced  Other Topics Concern  . Not on file  Social History Narrative  . Not on file    ECOG Status: 0 - Asymptomatic  Review of Systems: A 12 point ROS discussed and pertinent positives are indicated in the HPI above.  All  other systems are negative.  Review of Systems  Constitutional: Negative for activity change, fatigue and fever.  Respiratory: Negative.   Cardiovascular: Negative.   Gastrointestinal: Negative.     Vital Signs: BP 129/90   Pulse (!) 106   Temp 98.4 F (36.9 C) (Oral)   Resp 20   Ht 5' 1.5" (1.562 m)   Wt 190 lb (86.2 kg)   SpO2 100%   BMI 35.32 kg/m   Physical Exam  Constitutional: She appears well-developed and well-nourished.  HENT:  Head: Normocephalic and atraumatic.  Cardiovascular: Normal rate and regular rhythm.  Pulmonary/Chest: Effort normal and breath sounds normal.  Neurological: Sensory deficit:      Imaging:  Renal ultrasound- 02/07/2018  Labs:  CBC: Recent Labs    03/16/18 0801  WBC 10.3  HGB 12.2  HCT 34.6*  PLT 274    COAGS: Recent Labs    03/14/18 1502  INR 0.96    BMP: Recent Labs    11/09/17 0840 03/16/18 0801  NA 138 136  K 4.1 3.4*  CL 107 108  CO2 19* 18*  GLUCOSE 99 118*  BUN 42* 57*  CALCIUM 8.6 9.2  CREATININE 2.67* 3.73*  GFRNONAA 18* 12*  GFRAA 21* 13*    LIVER FUNCTION TESTS: Recent Labs    11/09/17 0840 03/14/18 1502 03/16/18 0801  BILITOT 0.4 0.6 0.7  AST 62* 35 31  ALT 46* 26 23  ALKPHOS  --  113 103  PROT 7.5 9.2* 9.2*  ALBUMIN  --  4.2 4.3    TUMOR MARKERS: No results for input(s): AFPTM, CEA, CA199, CHROMGRNA in the last 8760 hours.  Assessment and Plan:  [name] is a 67 y.o. female with past medical history significant for hypertension, hyperlipidemia, obesity, anemia and chronic stage IV kidney disease who presents today for ultrasound-guided random renal biopsy for tissue diagnostic purposes.  The patient is unaccompanied and serves as her own historian.  Risks and benefits ultrasound-guided random renal biopsy was discussed with the patient including, but not limited to bleeding, infection, damage to adjacent structures, worsening renal function, hematuria or low yield requiring  additional tests.  All of the patient's questions were answered, patient is agreeable to proceed.  Consent signed and in chart.  Thank you for this interesting consult.  I greatly enjoyed meeting Destiny Adams and look forward to participating in their care.  A copy of this report was sent to the requesting provider on this date.  Electronically Signed: Sandi Mariscal, MD 03/16/2018, 10:02 AM   I spent a total of 15 Minutes in face to face in clinical consultation, greater than 50% of which was counseling/coordinating care for US guided renal biopsy.

## 2018-03-16 NOTE — Progress Notes (Signed)
Patient clinically stable post right kidney biopsy per Dr Pascal Lux, vitals stable at this time. Sinus rhythm per monitor, denies complaints. bandade dry and intact.

## 2018-03-16 NOTE — Procedures (Signed)
Pre Procedure Dx: Worsening renal insufficiency Post Procedural Dx: Same  Technically successful US guided biopsy of inferior pole of the right kidney  EBL: None  No immediate complications.   Ronny Bacon, MD Pager #: [phone]

## 2018-03-17 LAB — HCV RNA QUANT RFLX ULTRA OR GENOTYP
HCV RNA Qnt(log copy/mL): 5.599 log10 IU/mL
HepC Qn: 397000 IU/mL

## 2018-03-17 LAB — HEPATITIS C GENOTYPE

## 2018-03-21 ENCOUNTER — Encounter: Payer: Self-pay | Admitting: Nephrology

## 2018-03-21 LAB — SURGICAL PATHOLOGY

## 2018-03-22 ENCOUNTER — Ambulatory Visit
Admission: RE | Admit: 2018-03-22 | Discharge: 2018-03-22 | Disposition: A | Discharge status: Home / Self Care | Payer: Medicare HMO | Source: Ambulatory Visit | Attending: Gastroenterology | Admitting: Gastroenterology

## 2018-03-22 DIAGNOSIS — B1920 Unspecified viral hepatitis C without hepatic coma: Secondary | ICD-10-CM | POA: Diagnosis not present

## 2018-03-22 DIAGNOSIS — B182 Chronic viral hepatitis C: Secondary | ICD-10-CM | POA: Diagnosis not present

## 2018-03-22 DIAGNOSIS — N271 Small kidney, bilateral: Secondary | ICD-10-CM | POA: Diagnosis not present

## 2018-03-22 DIAGNOSIS — N281 Cyst of kidney, acquired: Secondary | ICD-10-CM | POA: Diagnosis not present

## 2018-03-23 ENCOUNTER — Telehealth: Payer: Self-pay

## 2018-03-23 NOTE — Telephone Encounter (Signed)
Pt notified of results and the need for Hep A & B vaccines. Offered her to come to our office for these but pt says she has an appt with her PCP next month and will get those at that time.

## 2018-03-23 NOTE — Telephone Encounter (Signed)
-----   Message from Lucilla Lame, MD sent at 03/20/2018 12:01 PM EDT ----- This patient needs a vaccination for hepatitis B and A.  The patient also needs to be started on treatment for her hepatitis C.

## 2018-03-25 DIAGNOSIS — R809 Proteinuria, unspecified: Secondary | ICD-10-CM | POA: Diagnosis not present

## 2018-03-25 DIAGNOSIS — N2581 Secondary hyperparathyroidism of renal origin: Secondary | ICD-10-CM | POA: Diagnosis not present

## 2018-03-25 DIAGNOSIS — E872 Acidosis: Secondary | ICD-10-CM | POA: Diagnosis not present

## 2018-03-25 DIAGNOSIS — N186 End stage renal disease: Secondary | ICD-10-CM | POA: Diagnosis not present

## 2018-04-04 ENCOUNTER — Other Ambulatory Visit: Payer: Self-pay | Admitting: Nurse Practitioner

## 2018-04-04 DIAGNOSIS — J301 Allergic rhinitis due to pollen: Secondary | ICD-10-CM

## 2018-04-12 ENCOUNTER — Telehealth: Payer: Self-pay

## 2018-04-12 NOTE — Telephone Encounter (Signed)
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  She should be started on treatment for her hepatitis C.

## 2018-04-12 NOTE — Telephone Encounter (Signed)
Pt aware of [place] results. Pt has been started on hepatitis treatment.

## 2018-04-22 DIAGNOSIS — R809 Proteinuria, unspecified: Secondary | ICD-10-CM | POA: Diagnosis not present

## 2018-04-22 DIAGNOSIS — N2581 Secondary hyperparathyroidism of renal origin: Secondary | ICD-10-CM | POA: Diagnosis not present

## 2018-04-22 DIAGNOSIS — N185 Chronic kidney disease, stage 5: Secondary | ICD-10-CM | POA: Diagnosis not present

## 2018-04-22 DIAGNOSIS — I10 Essential (primary) hypertension: Secondary | ICD-10-CM | POA: Diagnosis not present

## 2018-04-26 ENCOUNTER — Encounter: Payer: Self-pay | Admitting: Nurse Practitioner

## 2018-04-26 ENCOUNTER — Ambulatory Visit (INDEPENDENT_AMBULATORY_CARE_PROVIDER_SITE_OTHER): Payer: Medicare HMO | Admitting: Nurse Practitioner

## 2018-04-26 ENCOUNTER — Other Ambulatory Visit: Payer: Self-pay

## 2018-04-26 VITALS — BP 129/80 | HR 91 | Temp 98.3°F | Ht 61.5 in | Wt 191.2 lb

## 2018-04-26 DIAGNOSIS — I10 Essential (primary) hypertension: Secondary | ICD-10-CM | POA: Diagnosis not present

## 2018-04-26 DIAGNOSIS — M150 Primary generalized (osteo)arthritis: Secondary | ICD-10-CM | POA: Diagnosis not present

## 2018-04-26 DIAGNOSIS — Z23 Encounter for immunization: Secondary | ICD-10-CM | POA: Diagnosis not present

## 2018-04-26 DIAGNOSIS — M8949 Other hypertrophic osteoarthropathy, multiple sites: Secondary | ICD-10-CM

## 2018-04-26 DIAGNOSIS — M159 Polyosteoarthritis, unspecified: Secondary | ICD-10-CM

## 2018-04-26 MED ORDER — METOPROLOL SUCCINATE ER 25 MG PO TB24: 25.0000 mg | ORAL_TABLET | Freq: Every day | ORAL | 1 refills | Status: DC

## 2018-04-26 MED ORDER — DICLOFENAC SODIUM 1 % TD GEL: 2.0000 g | Freq: Four times a day (QID) | TRANSDERMAL | 3 refills | Status: DC | PRN

## 2018-04-26 NOTE — Addendum Note (Signed)
Addended by: Wilson Singer on: 04/26/2018 03:46 PM   Modules accepted: Orders

## 2018-04-26 NOTE — Progress Notes (Signed)
Subjective:    Patient ID: Destiny Adams, female    DOB: 02-Jun-1951, 67 y.o.   MRN: [id number]  Destiny Adams is a 66 y.o. female presenting on 04/26/2018 for Hypertension   HPI Hypertension - She is checking BP at home or outside of clinic.  Readings 125-135/80-90 - Current medications: amlodipine 10 mg, metoprolol XL 12.5 mg once daily, tolerating well without side effects - She is not currently symptomatic. - Pt denies headache, lightheadedness, dizziness, changes in vision, chest tightness/pressure, palpitations, leg swelling, sudden loss of speech or loss of consciousness. - She  reports no regular exercise routine. - Her diet is moderate in salt, moderate in fat, and moderate in carbohydrates.    CKD Had appointment with Dr. Holley Raring.  Is planning to defer HD for now.  Planning to proceed with vascular referral to establish an HD vascular access in preparation for possible dialysis.  Back Pain Low back pain left to right.  Is taking Tylenol with some relief with 1,000 mg dose 1-2 times day max and is taking only about 3-4 times per week.  Still feels arthritis pain in low back.  Has avoided all ibuprofen.    Social History   Tobacco Use  . Smoking status: Never Smoker  . Smokeless tobacco: Never Used  Substance Use Topics  . Alcohol use: No  . Drug use: No    Review of Systems Per HPI unless specifically indicated above     Objective:    BP 129/80 (BP Location: Right Arm, Patient Position: Sitting, Cuff Size: Normal)   Pulse 91   Temp 98.3 F (36.8 C) (Oral)   Ht 5' 1.5" (1.562 m)   Wt 191 lb 3.2 oz (86.7 kg)   BMI 35.54 kg/m   Wt Readings from Last 3 Encounters:  04/26/18 191 lb 3.2 oz (86.7 kg)  03/16/18 190 lb (86.2 kg)  03/14/18 190 lb 8 oz (86.4 kg)    Physical Exam  Constitutional: She is oriented to person, place, and time. She appears well-developed and well-nourished. No distress.  HENT:  Head: Normocephalic and atraumatic.  Neck: Normal range  of motion. Neck supple. Carotid bruit is not present.  Cardiovascular: Normal rate, regular rhythm, S1 normal, S2 normal, normal heart sounds and intact distal pulses.  Pulmonary/Chest: Effort normal and breath sounds normal. No respiratory distress.  Musculoskeletal: She exhibits no edema (pedal).  Neurological: She is alert and oriented to person, place, and time.  Skin: Skin is warm and dry. Capillary refill takes less than 2 seconds.  Psychiatric: She has a normal mood and affect. Her behavior is normal. Judgment and thought content normal.  Vitals reviewed.    Results for orders placed or performed in visit on 03/16/18  Surgical pathology  Result Value Ref Range   SURGICAL PATHOLOGY      Surgical Pathology CASE: [id number] PATIENT: Destiny Adams Surgical Pathology Report     SPECIMEN SUBMITTED: A. Kidney, right  CLINICAL HISTORY: Worsening renal function, post US guided BX of the INF pole of the right kidney  PRE-OPERATIVE DIAGNOSIS: None Given  POST-OPERATIVE DIAGNOSIS: None Given     DIAGNOSIS: SEE REPORT UNDER RESULTS "[facility] KIDNEY BIOPSY SEND OUT" IN [facility].   Final Diagnosis performed by Quay Burow, MD.   Electronically signed 03/21/2018 3:33:45PM The electronic signature indicates that the named Attending Pathologist has evaluated the specimen  Technical component performed at [facility], [address], [place], [place] [address] Lab: [phone] Dir: Rush Farmer, MD, MMM  Professional component  performed at [facility], [facility], [place], [place], [place] [address] Lab: [phone] Dir: Dellia Nims. Rubinas, MD       Assessment & Plan:   Problem List Items Addressed This Visit      Cardiovascular and Mediastinum   Hypertension - Primary    Currently controlled hypertension at rest, near borderline of goal BP < 130/80.  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  Pt is currently working on lifestyle modifications for  improving diet.  Worsening BP today after acute stress from upcoming / unknown medical test results. -Complications: CKD, obesity  Plan: 1. Continue taking amlodipine without changes today. - INCREASE metoprolol XL 25 mg tablet.  Take 1 tablet daily. 2. Labs recently checked with Dr. Holley Raring - previously stable, slightly improved. 3. Encouraged heart healthy diet and increasing exercise to 30 minutes most days of the week. 4. Check BP 1-2 x per week at home, keep log, and bring to clinic at next appointment. Goal < 130/80.  Pt verbalized understanding she will call if remains above goal. 5. Follow up 3 months.       Relevant Medications   metoprolol succinate (TOPROL-XL) 25 MG 24 hr tablet    Other Visit Diagnoses    Primary osteoarthritis involving multiple joints       Pt with general joint aches and pains.  Today, low back pain likely related to arthritis as exam is normal.  START diclofenac gel 4 times daily prn. F/U prn   Relevant Medications   diclofenac sodium (VOLTAREN) 1 % GEL      Meds ordered this encounter  Medications  . metoprolol succinate (TOPROL-XL) 25 MG 24 hr tablet    Sig: Take 1 tablet (25 mg total) by mouth daily.    Dispense:  90 tablet    Refill:  1    Order Specific Question:   Supervising Provider    Answer:   Olin Hauser [[id number]]  . diclofenac sodium (VOLTAREN) 1 % GEL    Sig: Apply 2 g topically 4 (four) times daily as needed (low back pain, arthritis).    Dispense:  100 g    Refill:  3    Order Specific Question:   Supervising Provider    Answer:   Olin Hauser [[id number]]    Follow up plan: Return in about 3 months (around 07/26/2018) for hypertension AND vaccine only in 1 month and 6 months.  [name], DNP, AGPCNP-BC Adult Gerontology Primary Care Nurse Practitioner [facility] 04/26/2018, 1:46 PM

## 2018-04-26 NOTE — Assessment & Plan Note (Signed)
Currently controlled hypertension at rest, near borderline of goal BP < 130/80.  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  Pt is currently working on lifestyle modifications for improving diet.  Worsening BP today after acute stress from upcoming / unknown medical test results. -Complications: CKD, obesity  Plan: 1. Continue taking amlodipine without changes today. - INCREASE metoprolol XL 25 mg tablet.  Take 1 tablet daily. 2. Labs recently checked with Dr. Holley Raring - previously stable, slightly improved. 3. Encouraged heart healthy diet and increasing exercise to 30 minutes most days of the week. 4. Check BP 1-2 x per week at home, keep log, and bring to clinic at next appointment. Goal < 130/80.  Pt verbalized understanding she will call if remains above goal. 5. Follow up 3 months.

## 2018-04-26 NOTE — Patient Instructions (Addendum)
Destiny Adams,   Thank you for coming in to clinic today.  1. Continue amlodipine without change 2. INCREASE metoprolol to 25 mg full tablet once daily. 3. BP goals < 130/80.  Also call if by increasing medicine your BP readings are < 100/70  3. Make followup appointment for vaccine only: 1 month from today: Hepatitis B vaccine 6 months from today: Hepatitis B vaccine AND Hepatitis A vaccine  Please schedule a follow-up appointment with [name], AGNP. Return in about 3 months (around 07/26/2018) for hypertension AND vaccine only in 1 month and 6 months.  If you have any other questions or concerns, please feel free to call the clinic or send a message through [place]. You may also schedule an earlier appointment if necessary.  You will receive a survey after today's visit either digitally by e-mail or paper by [employer]. Your experiences and feedback matter to [place].  Please respond so we know how we are doing as we provide care for you.   [name], DNP, AGNP-BC Adult Gerontology Nurse Practitioner [place]

## 2018-05-27 ENCOUNTER — Ambulatory Visit (INDEPENDENT_AMBULATORY_CARE_PROVIDER_SITE_OTHER): Payer: Medicare HMO

## 2018-05-27 ENCOUNTER — Ambulatory Visit: Payer: Medicare HMO

## 2018-05-27 DIAGNOSIS — Z23 Encounter for immunization: Secondary | ICD-10-CM

## 2018-05-27 DIAGNOSIS — I10 Essential (primary) hypertension: Secondary | ICD-10-CM | POA: Diagnosis not present

## 2018-05-27 DIAGNOSIS — N2581 Secondary hyperparathyroidism of renal origin: Secondary | ICD-10-CM | POA: Diagnosis not present

## 2018-05-27 DIAGNOSIS — D631 Anemia in chronic kidney disease: Secondary | ICD-10-CM | POA: Diagnosis not present

## 2018-05-27 DIAGNOSIS — N184 Chronic kidney disease, stage 4 (severe): Secondary | ICD-10-CM | POA: Diagnosis not present

## 2018-05-27 DIAGNOSIS — N041 Nephrotic syndrome with focal and segmental glomerular lesions: Secondary | ICD-10-CM | POA: Diagnosis not present

## 2018-05-27 DIAGNOSIS — R809 Proteinuria, unspecified: Secondary | ICD-10-CM | POA: Diagnosis not present

## 2018-06-01 ENCOUNTER — Other Ambulatory Visit (INDEPENDENT_AMBULATORY_CARE_PROVIDER_SITE_OTHER): Payer: Self-pay | Admitting: Nurse Practitioner

## 2018-06-01 DIAGNOSIS — N186 End stage renal disease: Secondary | ICD-10-CM

## 2018-06-02 ENCOUNTER — Ambulatory Visit (INDEPENDENT_AMBULATORY_CARE_PROVIDER_SITE_OTHER): Payer: Medicare HMO

## 2018-06-02 ENCOUNTER — Encounter (INDEPENDENT_AMBULATORY_CARE_PROVIDER_SITE_OTHER): Payer: Medicare HMO | Admitting: Vascular Surgery

## 2018-06-02 ENCOUNTER — Ambulatory Visit (INDEPENDENT_AMBULATORY_CARE_PROVIDER_SITE_OTHER): Payer: Medicare HMO | Admitting: Nurse Practitioner

## 2018-06-02 ENCOUNTER — Encounter (INDEPENDENT_AMBULATORY_CARE_PROVIDER_SITE_OTHER): Payer: Self-pay | Admitting: Nurse Practitioner

## 2018-06-02 VITALS — BP 156/103 | HR 91 | Resp 16 | Ht 61.0 in | Wt 188.8 lb

## 2018-06-02 DIAGNOSIS — E782 Mixed hyperlipidemia: Secondary | ICD-10-CM

## 2018-06-02 DIAGNOSIS — I10 Essential (primary) hypertension: Secondary | ICD-10-CM

## 2018-06-02 DIAGNOSIS — I129 Hypertensive chronic kidney disease with stage 1 through stage 4 chronic kidney disease, or unspecified chronic kidney disease: Secondary | ICD-10-CM | POA: Diagnosis not present

## 2018-06-02 DIAGNOSIS — E1122 Type 2 diabetes mellitus with diabetic chronic kidney disease: Secondary | ICD-10-CM | POA: Diagnosis not present

## 2018-06-02 DIAGNOSIS — N186 End stage renal disease: Secondary | ICD-10-CM

## 2018-06-02 DIAGNOSIS — N184 Chronic kidney disease, stage 4 (severe): Secondary | ICD-10-CM | POA: Diagnosis not present

## 2018-06-06 ENCOUNTER — Encounter (INDEPENDENT_AMBULATORY_CARE_PROVIDER_SITE_OTHER): Payer: Self-pay | Admitting: Nurse Practitioner

## 2018-06-06 NOTE — Progress Notes (Signed)
Subjective:    Patient ID: Destiny Adams, female    DOB: Aug 18, 1950, 67 y.o.   MRN: [id number] Chief Complaint  Patient presents with  . New Patient (Initial Visit)    ref Holley Raring for vein mapping    HPI  HAUNANI Adams is a 67 y.o. female The patient is seen for evaluation for dialysis access. The patient has chronic renal insufficiency stage V secondary to hypertension and diabetes. The patient's most recent creatinine clearance is 20. The patient volume status has not yet become an issue. Patient's blood pressures been relatively well controlled. There are mild uremic symptoms which appear to be relatively well tolerated at this time. The patient is right-handed.  The patient has been considering the various methods of dialysis and wishes to proceed with hemodialysis and therefore creation of AV access.  The patient denies amaurosis fugax or recent TIA symptoms. There are no recent neurological changes noted. The patient denies claudication symptoms or rest pain symptoms. The patient denies history of DVT, PE or superficial thrombophlebitis. The patient denies recent episodes of angina or shortness of breath. The patient is seen for evaluation for dialysis access. The patient has chronic renal insufficiency stage V secondary to hypertension and diabetes. The patient's most recent creatinine clearance is less than 20. The patient volume status has not yet become an issue. Patient's blood pressures been relatively well controlled. There are mild uremic symptoms which appear to be relatively well tolerated at this time. The patient is right-handed.  The patient has been considering the various methods of dialysis and wishes to proceed with hemodialysis and therefore creation of AV access.  The patient denies amaurosis fugax or recent TIA symptoms. There are no recent neurological changes noted. The patient denies claudication symptoms or rest pain symptoms. The patient denies history of  DVT, PE or superficial thrombophlebitis. The patient denies recent episodes of angina or shortness of breath.   The patient has marginally acceptable veins in the left upper arm for creation of a left brachiocephalic fistula.  If these veins are not found to be suitable during the time of surgery, the patient should have a brachial axillary graft placed instead.  Past Medical History:  Diagnosis Date  . Anemia   . CKD (chronic kidney disease) stage 4, GFR 15-29 ml/min (HCC) 07/23/2017  . GERD (gastroesophageal reflux disease)   . Hyperlipidemia   . Hypertension   . Severe obesity (BMI 35.0-35.9 with comorbidity) ([place]) 07/23/2017   Comorbid CKD stage IV, Hypertension, Hyperlipidemia    Social History   Socioeconomic History  . Marital status: Divorced    Spouse name: Not on file  . Number of children: Not on file  . Years of education: Not on file  . Highest education level: Not on file  Occupational History  . Not on file  Social Needs  . Financial resource strain: Not hard at all  . Food insecurity:    Worry: Never true    Inability: Never true  . Transportation needs:    Medical: No    Non-medical: No  Tobacco Use  . Smoking status: Never Smoker  . Smokeless tobacco: Never Used  Substance and Sexual Activity  . Alcohol use: No  . Drug use: No  . Sexual activity: Not on file  Lifestyle  . Physical activity:    Days per week: 0 days    Minutes per session: 0 min  . Stress: Not at all  Relationships  . Social connections:  Talks on phone: More than three times a week    Gets together: More than three times a week    Attends religious service: More than 4 times per year    Active member of club or organization: No    Attends meetings of clubs or organizations: Never    Relationship status: Divorced  . Intimate partner violence:    Fear of current or ex partner: No    Emotionally abused: No    Physically abused: No    Forced sexual activity: No  Other Topics  Concern  . Not on file  Social History Narrative  . Not on file    Past Surgical History:  Procedure Laterality Date  . COLONOSCOPY WITH PROPOFOL N/A 07/29/2016   Procedure: COLONOSCOPY WITH PROPOFOL;  Surgeon: Robert Bellow, MD;  Location: [facility]COPY;  Service: Endoscopy;  Laterality: N/A;  . TUBAL LIGATION      Family History  Problem Relation Age of Onset  . Colon cancer Maternal Uncle   . Colon cancer Paternal Uncle   . Breast cancer Paternal Aunt        33  . Alzheimer's disease Mother   . Cancer Father     No Known Allergies   Review of Systems   Review of Systems: Negative Unless Checked Constitutional: [] Weight loss  [] Fever  [] Chills Cardiac: [] Chest pain   []  Atrial Fibrillation  [] Palpitations   [] Shortness of breath when laying flat   [] Shortness of breath with exertion. Vascular:  [] Pain in legs with walking   [] Pain in legs with standing  [] History of DVT   [] Phlebitis   [] Swelling in legs   [] Varicose veins   [] Non-healing ulcers Pulmonary:   [] Uses home oxygen   [] Productive cough   [] Hemoptysis   [] Wheeze  [] COPD   [] Asthma Neurologic:  [] Dizziness   [] Seizures   [] History of stroke   [] History of TIA  [] Aphasia   [] Vissual changes   [] Weakness or numbness in arm   [] Weakness or numbness in leg Musculoskeletal:   [] Joint swelling   [] Joint pain   [] Low back pain  []  History of Knee Replacement Hematologic:  [] Easy bruising  [] Easy bleeding   [] Hypercoagulable state   [x] Anemic Gastrointestinal:  [] Diarrhea   [] Vomiting  [] Gastroesophageal reflux/heartburn   [] Difficulty swallowing. Genitourinary:  [x] Chronic kidney disease   [] Difficult urination  [] Anuric   [] Blood in urine Skin:  [] Rashes   [] Ulcers  Psychological:  [] History of anxiety   []  History of major depression  []  Memory Difficulties     Objective:   Physical Exam  BP (!) 156/103 (BP Location: Left Arm)   Pulse 91   Resp 16   Ht 5\' 1"  (1.549 m)   Wt 188 lb 12.8 oz (85.6 kg)   BMI  35.67 kg/m   Gen: WD/WN, NAD Head: [place]/AT, No temporalis wasting.  Ear/Nose/Throat: Hearing grossly intact, nares w/o erythema or drainage Eyes: PER, EOMI, sclera nonicteric.  Neck: Supple, no masses.  No JVD.  Pulmonary:  Good air movement, no use of accessory muscles.  Cardiac: RRR Vascular:  Vessel Right Left  Radial Palpable Palpable   Gastrointestinal: soft, non-distended. No guarding/no peritoneal signs.  Musculoskeletal: M/S 5/5 throughout.  No deformity or atrophy.  Neurologic: Pain and light touch intact in extremities.  Symmetrical.  Speech is fluent. Motor exam as listed above. Psychiatric: Judgment intact, Mood & affect appropriate for pt's clinical situation. Dermatologic: No Venous rashes. No Ulcers Noted.  No changes consistent with cellulitis.  Lymph : No Cervical lymphadenopathy, no lichenification or skin changes of chronic lymphedema.      Assessment & Plan:   1. CKD (chronic kidney disease) stage 4, GFR 15-29 ml/min (HCC) The patient has marginally acceptable veins in the left upper arm for creation of a left brachiocephalic fistula.  If these veins are not found to be suitable during the time of surgery, the patient should have a brachial axillary graft placed instead.  Currently the patient is hesitant to have a fistula created without the input of her nephrologist, Dr. Holley Raring.  This is because her GFR is increasing.  I have discussed the possibility of placing an AV fistula, with time to allow it to mature, regardless of GFR.  There would be no need to utilize a fistula if it was not necessary.  The only instance where we would not place an access if it was absolutely necessary that she had a graft, for which we will wait until dialysis use was more imminent.  Patient will speak with her nephrologist and contact us after.  2. Mixed hyperlipidemia Continue antihypertensive medications as already ordered, these medications have been reviewed and there are no changes  at this time.   3. Essential hypertension Continue antihypertensive medications as already ordered, these medications have been reviewed and there are no changes at this time.    Current Outpatient Medications on File Prior to Visit  Medication Sig Dispense Refill  . amLODipine (NORVASC) 10 MG tablet Take 1 tablet (10 mg total) by mouth daily. 90 tablet 3  . atorvastatin (LIPITOR) 20 MG tablet Take 1 tablet (20 mg total) by mouth daily. 90 tablet 3  . diclofenac sodium (VOLTAREN) 1 % GEL Apply 2 g topically 4 (four) times daily as needed (low back pain, arthritis). 100 g 3  . loratadine (CLARITIN) 10 MG tablet Take 10 mg by mouth daily.    . metoprolol succinate (TOPROL-XL) 25 MG 24 hr tablet Take 1 tablet (25 mg total) by mouth daily. 90 tablet 1  . omeprazole (PRILOSEC) 20 MG capsule Take 1 capsule (20 mg total) by mouth daily. 90 capsule 2  . fluticasone (FLONASE) 50 MCG/ACT nasal spray USE 2 SPRAYS IN EACH NOSTRIL EVERY DAY (Patient not taking: Reported on 04/26/2018) 48 g 6  . levocetirizine (XYZAL) 5 MG tablet TAKE 1 TABLET EVERY EVENING (Patient not taking: Reported on 04/26/2018) 90 tablet 1   No current facility-administered medications on file prior to visit.     There are no Patient Instructions on file for this visit. No follow-ups on file.   Kris Hartmann, NP

## 2018-06-14 ENCOUNTER — Other Ambulatory Visit: Payer: Self-pay | Admitting: Nurse Practitioner

## 2018-06-14 DIAGNOSIS — K219 Gastro-esophageal reflux disease without esophagitis: Secondary | ICD-10-CM

## 2018-06-20 DIAGNOSIS — N184 Chronic kidney disease, stage 4 (severe): Secondary | ICD-10-CM | POA: Diagnosis not present

## 2018-06-20 DIAGNOSIS — R809 Proteinuria, unspecified: Secondary | ICD-10-CM | POA: Diagnosis not present

## 2018-06-20 DIAGNOSIS — N2581 Secondary hyperparathyroidism of renal origin: Secondary | ICD-10-CM | POA: Diagnosis not present

## 2018-06-20 DIAGNOSIS — I10 Essential (primary) hypertension: Secondary | ICD-10-CM | POA: Diagnosis not present

## 2018-07-04 ENCOUNTER — Other Ambulatory Visit: Payer: Self-pay | Admitting: Nurse Practitioner

## 2018-07-04 DIAGNOSIS — I10 Essential (primary) hypertension: Secondary | ICD-10-CM

## 2018-07-04 DIAGNOSIS — E782 Mixed hyperlipidemia: Secondary | ICD-10-CM

## 2018-07-18 ENCOUNTER — Encounter (INDEPENDENT_AMBULATORY_CARE_PROVIDER_SITE_OTHER): Payer: Medicare HMO | Admitting: Vascular Surgery

## 2018-07-18 ENCOUNTER — Encounter (INDEPENDENT_AMBULATORY_CARE_PROVIDER_SITE_OTHER): Payer: Medicare HMO

## 2018-07-28 ENCOUNTER — Ambulatory Visit: Payer: Medicare HMO | Admitting: Nurse Practitioner

## 2018-08-18 DIAGNOSIS — N041 Nephrotic syndrome with focal and segmental glomerular lesions: Secondary | ICD-10-CM | POA: Diagnosis not present

## 2018-08-18 DIAGNOSIS — N184 Chronic kidney disease, stage 4 (severe): Secondary | ICD-10-CM | POA: Diagnosis not present

## 2018-08-18 DIAGNOSIS — I10 Essential (primary) hypertension: Secondary | ICD-10-CM | POA: Diagnosis not present

## 2018-08-18 DIAGNOSIS — R809 Proteinuria, unspecified: Secondary | ICD-10-CM | POA: Diagnosis not present

## 2018-08-18 DIAGNOSIS — N2581 Secondary hyperparathyroidism of renal origin: Secondary | ICD-10-CM | POA: Diagnosis not present

## 2018-08-18 DIAGNOSIS — D631 Anemia in chronic kidney disease: Secondary | ICD-10-CM | POA: Diagnosis not present

## 2018-08-22 ENCOUNTER — Encounter: Payer: Self-pay | Admitting: Nurse Practitioner

## 2018-08-22 ENCOUNTER — Other Ambulatory Visit: Payer: Self-pay

## 2018-08-22 ENCOUNTER — Ambulatory Visit (INDEPENDENT_AMBULATORY_CARE_PROVIDER_SITE_OTHER): Payer: Medicare HMO | Admitting: Nurse Practitioner

## 2018-08-22 VITALS — BP 140/81 | HR 74 | Temp 98.1°F | Ht 61.0 in | Wt 197.2 lb

## 2018-08-22 DIAGNOSIS — N185 Chronic kidney disease, stage 5: Secondary | ICD-10-CM

## 2018-08-22 DIAGNOSIS — N2581 Secondary hyperparathyroidism of renal origin: Secondary | ICD-10-CM | POA: Diagnosis not present

## 2018-08-22 DIAGNOSIS — I120 Hypertensive chronic kidney disease with stage 5 chronic kidney disease or end stage renal disease: Secondary | ICD-10-CM | POA: Diagnosis not present

## 2018-08-22 DIAGNOSIS — I10 Essential (primary) hypertension: Secondary | ICD-10-CM | POA: Diagnosis not present

## 2018-08-22 DIAGNOSIS — E782 Mixed hyperlipidemia: Secondary | ICD-10-CM

## 2018-08-22 MED ORDER — METOPROLOL SUCCINATE ER 50 MG PO TB24: 50.0000 mg | ORAL_TABLET | Freq: Every day | ORAL | 1 refills | Status: DC

## 2018-08-22 NOTE — Assessment & Plan Note (Signed)
GFR stable on last labs.  Management continues with Dr. Holley Raring ([facility]).  Continue to control blood pressure to goal < 130/80 if tolerated.  Complicated by Hypertension, secondary hyperparathyroidism.  Followup as needed and with Dr. Holley Raring.

## 2018-08-22 NOTE — Assessment & Plan Note (Signed)
Continues to be managed with nephrology.  Will likely need DEXA for preventing osteoporosis.  Will await last nephrology labs for bone workup.

## 2018-08-22 NOTE — Progress Notes (Signed)
Subjective:    Patient ID: Destiny Adams, female    DOB: March 08, 1951, 68 y.o.   MRN: [id number]  Destiny Adams is a 68 y.o. female presenting on 08/22/2018 for Hypertension (She also concern because her blood pressure was elevated yesterday afternoon 140/100. pt states Right arm still sore from Hep B injection given Oct 2019. )   HPI Hypertension  - She is checking BP at home or outside of clinic.  Readings higher than clinic readings regularly - Current medications: amlodipine 10 mg once daily, losartan 50 mg once daily, metoprolol 25 mg daily, tolerating well without side effects - She is not currently symptomatic.  Admits to having some headaches from hypertension in past 3 months. - Pt denies lightheadedness, dizziness, changes in vision, chest tightness/pressure, palpitations, leg swelling, sudden loss of speech or loss of consciousness. - She  reports an exercise routine that includes walking, 1-2 days per week. Is active inside when she cannot get outside. - Her diet is moderate in salt, moderate in fat, and moderate in carbohydrates.  - Generally, patient states she feels very well compared to 1 year ago.  She is taking regular trips with family to [place] to visit extended family and children.  CKD stage V not requiring HD Patient continues to follow with Dr. Holley Raring.  Has started new losartan 1 week ago with some improvement in BP.  Has had good report last week for electrolytes and continued stable GFR.   - She has started new sodium bicarbonate and calcitriol (secondary hyperparathyroidism). Also had bone workup labs last week.   RIGHT arm soreness Hep B injection in October 2019 - First was not symptomatic. Current injection  Next due in March to April.  Patient may consider gluteal injection. Arm soreness is continuing to improve.   Social History   Tobacco Use  . Smoking status: Never Smoker  . Smokeless tobacco: Never Used  Substance Use Topics  . Alcohol use: No  .  Drug use: No    Review of Systems Per HPI unless specifically indicated above     Objective:    BP 140/81 (BP Location: Left Arm, Patient Position: Sitting, Cuff Size: Large)   Pulse 74   Temp 98.1 F (36.7 C) (Oral)   Ht 5\' 1"  (1.549 m)   Wt 197 lb 3.2 oz (89.4 kg)   BMI 37.26 kg/m   Wt Readings from Last 3 Encounters:  08/22/18 197 lb 3.2 oz (89.4 kg)  06/02/18 188 lb 12.8 oz (85.6 kg)  04/26/18 191 lb 3.2 oz (86.7 kg)    Physical Exam Vitals signs reviewed.  Constitutional:      General: She is not in acute distress.    Appearance: She is well-developed.  HENT:     Head: Normocephalic and atraumatic.     Mouth/Throat:     Mouth: Mucous membranes are moist.     Pharynx: Oropharynx is clear.  Eyes:     Pupils: Pupils are equal, round, and reactive to light.  Cardiovascular:     Rate and Rhythm: Normal rate and regular rhythm.     Pulses:          Radial pulses are 2+ on the right side and 2+ on the left side.       Posterior tibial pulses are 1+ on the right side and 1+ on the left side.     Heart sounds: Normal heart sounds, S1 normal and S2 normal.  Pulmonary:  Effort: Pulmonary effort is normal. No respiratory distress.     Breath sounds: Normal breath sounds and air entry.  Musculoskeletal:     Right upper arm: She exhibits tenderness (mild upper arm deltoid tenderness).     Right lower leg: No edema.     Left lower leg: No edema.  Skin:    General: Skin is warm and dry.     Capillary Refill: Capillary refill takes less than 2 seconds.  Neurological:     Mental Status: She is alert and oriented to person, place, and time.  Psychiatric:        Attention and Perception: Attention normal.        Mood and Affect: Mood and affect normal.        Behavior: Behavior normal. Behavior is cooperative.        Thought Content: Thought content normal.        Judgment: Judgment normal.        Assessment & Plan:   Problem List Items Addressed This Visit       Cardiovascular and Mediastinum   Hypertension - Primary    Currently has mildly elevated hypertension at rest.  Patient has goal BP < 130/80 if tolerated.  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  Pt is currently working on lifestyle modifications for improving diet, staying active.   -Complications: CKD, obesity  Plan: 1. Continue taking amlodipine without changes today. - INCREASE metoprolol XL 50 mg tablet.  Take 1 tablet daily. 2. Labs recently checked with Dr. Holley Raring - stable.  Has had slight improvement overall to GFR 18 3. Encouraged heart healthy diet and increasing exercise to 30 minutes most days of the week. 4. Check BP 1-2 x per week at home, keep log, and bring to clinic at next appointment. Goal < 130/80.  Pt verbalized understanding she will call if remains above goal. 5. Follow up 3 months.  Can consider q6 month follow-up in future if BP remains at goal and stable.       Relevant Medications   losartan (COZAAR) 50 MG tablet   metoprolol succinate (TOPROL-XL) 50 MG 24 hr tablet     Endocrine   Secondary hyperparathyroidism of renal origin ([place])    Continues to be managed with nephrology.  Will likely need DEXA for preventing osteoporosis.  Will await last nephrology labs for bone workup.        Genitourinary   CKD stage 5 secondary to hypertension (HCC)    GFR stable on last labs.  Management continues with Dr. Holley Raring ([facility]).  Continue to control blood pressure to goal < 130/80 if tolerated.  Complicated by Hypertension, secondary hyperparathyroidism.  Followup as needed and with Dr. Holley Raring.      Relevant Medications   SODIUM BICARBONATE PO   calcitRIOL (ROCALTROL) 0.5 MCG capsule   losartan (COZAAR) 50 MG tablet     Other   Hyperlipidemia   Relevant Medications   losartan (COZAAR) 50 MG tablet   metoprolol succinate (TOPROL-XL) 50 MG 24 hr tablet   Other Relevant Orders   Lipid panel    Lipid panel due at next visit.  Patient should continue on  statin med to prevent ASCVD.  Currently asymptomatic on statin and without new ASCVD events.  Follow-up 3 months.    Meds ordered this encounter  Medications  . metoprolol succinate (TOPROL-XL) 50 MG 24 hr tablet    Sig: Take 1 tablet (50 mg total) by mouth daily.  Dispense:  90 tablet    Refill:  1    Order Specific Question:   Supervising Provider    Answer:   Olin Hauser [[id number]]   Follow up plan: Return in about 3 months (around 11/21/2018) for hypertension, cholesterol.  [name], DNP, AGPCNP-BC Adult Gerontology Primary Care Nurse Practitioner [facility] 08/22/2018, 9:49 AM

## 2018-08-22 NOTE — Assessment & Plan Note (Signed)
Currently has mildly elevated hypertension at rest.  Patient has goal BP < 130/80 if tolerated.  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  Pt is currently working on lifestyle modifications for improving diet, staying active.   -Complications: CKD, obesity  Plan: 1. Continue taking amlodipine without changes today. - INCREASE metoprolol XL 50 mg tablet.  Take 1 tablet daily. 2. Labs recently checked with Dr. Holley Raring - stable.  Has had slight improvement overall to GFR 18 3. Encouraged heart healthy diet and increasing exercise to 30 minutes most days of the week. 4. Check BP 1-2 x per week at home, keep log, and bring to clinic at next appointment. Goal < 130/80.  Pt verbalized understanding she will call if remains above goal. 5. Follow up 3 months.  Can consider q6 month follow-up in future if BP remains at goal and stable.

## 2018-08-22 NOTE — Patient Instructions (Addendum)
Tema Alire Lookout,   Thank you for coming in to clinic today.  1. Continue new losartan  2. Increase metoprolol ER to 50 mg once daily (previously 25 mg once daily).  If you have any new dizziness, please call clinic.  3. You will be due for FASTING BLOOD WORK.  This means you should eat no food or drink after midnight.  Drink only water or coffee without cream/sugar on the morning of your lab visit. - Please go ahead and schedule a "Lab Only" visit in the morning at the clinic for lab draw at your next visit for Cholesterol (lipid panel). - Your results will be available about 2-3 days after blood draw.  If you have set up a MyChart account, you can can log in to MyChart online to view your results and a brief explanation. Also, we can discuss your results together at your next office visit if you would like.   Please schedule a follow-up appointment with [name], AGNP. Return in about 3 months (around 11/21/2018) for hypertension, cholesterol.  If you have any other questions or concerns, please feel free to call the clinic or send a message through [place]. You may also schedule an earlier appointment if necessary.  You will receive a survey after today's visit either digitally by e-mail or paper by [employer]. Your experiences and feedback matter to [place].  Please respond so we know how we are doing as we provide care for you.   [name], DNP, AGNP-BC Adult Gerontology Nurse Practitioner [place]

## 2018-08-24 ENCOUNTER — Other Ambulatory Visit: Payer: Self-pay | Admitting: Nurse Practitioner

## 2018-08-24 DIAGNOSIS — J301 Allergic rhinitis due to pollen: Secondary | ICD-10-CM

## 2018-10-26 ENCOUNTER — Ambulatory Visit: Payer: Medicare HMO

## 2018-10-27 DIAGNOSIS — N041 Nephrotic syndrome with focal and segmental glomerular lesions: Secondary | ICD-10-CM | POA: Diagnosis not present

## 2018-10-27 DIAGNOSIS — N184 Chronic kidney disease, stage 4 (severe): Secondary | ICD-10-CM | POA: Diagnosis not present

## 2018-10-27 DIAGNOSIS — I10 Essential (primary) hypertension: Secondary | ICD-10-CM | POA: Diagnosis not present

## 2018-10-27 DIAGNOSIS — R809 Proteinuria, unspecified: Secondary | ICD-10-CM | POA: Diagnosis not present

## 2018-10-27 DIAGNOSIS — D631 Anemia in chronic kidney disease: Secondary | ICD-10-CM | POA: Diagnosis not present

## 2018-10-27 DIAGNOSIS — N2581 Secondary hyperparathyroidism of renal origin: Secondary | ICD-10-CM | POA: Diagnosis not present

## 2018-11-17 ENCOUNTER — Other Ambulatory Visit: Payer: Self-pay | Admitting: Nurse Practitioner

## 2018-11-17 DIAGNOSIS — I10 Essential (primary) hypertension: Secondary | ICD-10-CM

## 2018-11-17 DIAGNOSIS — E782 Mixed hyperlipidemia: Secondary | ICD-10-CM

## 2018-11-17 DIAGNOSIS — N2581 Secondary hyperparathyroidism of renal origin: Secondary | ICD-10-CM

## 2018-11-17 DIAGNOSIS — K219 Gastro-esophageal reflux disease without esophagitis: Secondary | ICD-10-CM

## 2018-11-17 DIAGNOSIS — I120 Hypertensive chronic kidney disease with stage 5 chronic kidney disease or end stage renal disease: Secondary | ICD-10-CM

## 2018-11-17 DIAGNOSIS — N185 Chronic kidney disease, stage 5: Secondary | ICD-10-CM

## 2018-11-18 ENCOUNTER — Other Ambulatory Visit: Payer: Self-pay | Admitting: Nurse Practitioner

## 2018-11-18 DIAGNOSIS — I10 Essential (primary) hypertension: Secondary | ICD-10-CM

## 2018-11-18 DIAGNOSIS — E782 Mixed hyperlipidemia: Secondary | ICD-10-CM

## 2018-11-21 ENCOUNTER — Other Ambulatory Visit: Payer: Self-pay

## 2018-11-21 ENCOUNTER — Encounter: Payer: Self-pay | Admitting: Nurse Practitioner

## 2018-11-21 ENCOUNTER — Ambulatory Visit (INDEPENDENT_AMBULATORY_CARE_PROVIDER_SITE_OTHER): Payer: Medicare HMO | Admitting: Nurse Practitioner

## 2018-11-21 DIAGNOSIS — E782 Mixed hyperlipidemia: Secondary | ICD-10-CM

## 2018-11-21 DIAGNOSIS — J301 Allergic rhinitis due to pollen: Secondary | ICD-10-CM

## 2018-11-21 DIAGNOSIS — I10 Essential (primary) hypertension: Secondary | ICD-10-CM

## 2018-11-21 DIAGNOSIS — N2581 Secondary hyperparathyroidism of renal origin: Secondary | ICD-10-CM | POA: Diagnosis not present

## 2018-11-21 DIAGNOSIS — N185 Chronic kidney disease, stage 5: Secondary | ICD-10-CM | POA: Diagnosis not present

## 2018-11-21 DIAGNOSIS — I120 Hypertensive chronic kidney disease with stage 5 chronic kidney disease or end stage renal disease: Secondary | ICD-10-CM

## 2018-11-21 DIAGNOSIS — K219 Gastro-esophageal reflux disease without esophagitis: Secondary | ICD-10-CM | POA: Diagnosis not present

## 2018-11-21 MED ORDER — AMLODIPINE BESYLATE 10 MG PO TABS: 10.0000 mg | ORAL_TABLET | Freq: Every day | ORAL | 1 refills | Status: DC

## 2018-11-21 MED ORDER — ATORVASTATIN CALCIUM 20 MG PO TABS: 20.0000 mg | ORAL_TABLET | Freq: Every day | ORAL | 0 refills | Status: DC

## 2018-11-21 MED ORDER — METOPROLOL SUCCINATE ER 50 MG PO TB24: 50.0000 mg | ORAL_TABLET | Freq: Every day | ORAL | 1 refills | Status: DC

## 2018-11-21 MED ORDER — OMEPRAZOLE 20 MG PO CPDR: 20.0000 mg | DELAYED_RELEASE_CAPSULE | Freq: Every day | ORAL | 1 refills | Status: DC

## 2018-11-21 NOTE — Progress Notes (Signed)
Telemedicine Encounter: Disclosed to patient at start of encounter that we will provide appropriate telemedicine services.  Patient consents to be treated via phone prior to discussion. - Patient is at her home and is accessed via telephone. - Services are provided by [name] from [facility].  Subjective:    Patient ID: Destiny Adams, female    DOB: Jul 04, 1951, 68 y.o.   MRN: [id number]  Destiny Adams is a 68 y.o. female presenting on 11/21/2018 for Hypertension  HPI Hypertension with CKD  - Patient continues to be followed by nephrology for CKD - stable symptoms at this time. - She is checking BP at home or outside of clinic.  Readings 132/76; always under 140/90 - Current medications: amlodipine 10 mg once daily, losartan 50 mg once daily, metoprolol succinate 50 mg once daily, tolerating well without side effects - She is not currently symptomatic. - Pt denies headache, lightheadedness, dizziness, changes in vision, chest tightness/pressure, palpitations, leg swelling, sudden loss of speech or loss of consciousness.  GERD Patient is taking omeprazole 20 mg once daily in morning.  This is controlling her acid reflux without any breakthrough symptoms.  Hyperlipidemia Patient is continuing her atorvastatin 20 mg once daily without side effects.   - Patient refuses to come get her lipid panel until Covid-19 precautions have been lifted despite my best efforts to encourage her to have it collected.  This has not been drawn in over 6 months and she failed to return to clinic for lipid panel in January as requested. - Pt denies changes in vision, chest tightness/pressure, palpitations, shortness of breath, leg pain while walking, leg or arm weakness, and sudden loss of speech or loss of consciousness.  - Denies any events of Stroke or Heart attack since last visit.  Seasonal Allergies Patient is continuing to have good control of allergies currently with  levocetirizine.  She has not needed fluticasone in last several weeks and denies any uncontrollable sinus pressure or signs and symptoms of infection.  Social History   Tobacco Use  . Smoking status: Never Smoker  . Smokeless tobacco: Never Used  Substance Use Topics  . Alcohol use: No  . Drug use: No    Review of Systems Per HPI unless specifically indicated above     Objective:    There were no vitals taken for this visit.  Wt Readings from Last 3 Encounters:  08/22/18 197 lb 3.2 oz (89.4 kg)  06/02/18 188 lb 12.8 oz (85.6 kg)  04/26/18 191 lb 3.2 oz (86.7 kg)    Physical Exam Patient remotely monitored.  No hands-on exam performed.  Verbal communication appropriate.  Cognition normal.     Assessment & Plan:   Problem List Items Addressed This Visit      Cardiovascular and Mediastinum   Hypertension Status unknown due to inability to check BP at today's visit. Likely remains above goal and uncontrolled given home BP readings >130/80 and last reading at Dr. Holley Raring of 152/90.  Recheck labs.  Continue meds without changes today.  Refills provided. Followup 3 months.   Relevant Medications   amLODipine (NORVASC) 10 MG tablet   atorvastatin (LIPITOR) 20 MG tablet   metoprolol succinate (TOPROL-XL) 50 MG 24 hr tablet     Respiratory   Seasonal allergic rhinitis due to pollen Stable today on exam.  Medications tolerated without side effects.  Continue at current doses.  Refills provided.  No current complications or increased sinus pressure. Followup 1 year.  Digestive   GERD (gastroesophageal reflux disease) - Primary Currently well controlled on omeprazole 20 mg once daily.  Plan: 1. Continue omeprazole 20 mg once daily. Side effects discussed. Pt wants to continue med. 2. Avoid diet triggers. Reviewed need to seek care if globus sensation, difficulty swallowing, s/sx of GI bleed. 3. Needs labs for CBC - ordered, patient refuses to come to clinic 4. Follow up as  needed and in 3 months.    Relevant Medications   omeprazole (PRILOSEC) 20 MG capsule   Other Relevant Orders   CBC with Differential/Platelet     Endocrine   Secondary hyperparathyroidism of renal origin ([place]) Continue management of CKD with Dr. Holley Raring.     Genitourinary   CKD stage 5 secondary to hypertension ([place]) Continue management of CKD with Dr. Holley Raring.  Manage hypertension to goal <130/80 preferred.     Other   Hyperlipidemia Status unknown.  Recheck labs.  Continue meds without changes today.  Refills provided. Followup 3 months and prn for med changes after labs.    Relevant Medications   amLODipine (NORVASC) 10 MG tablet   atorvastatin (LIPITOR) 20 MG tablet   metoprolol succinate (TOPROL-XL) 50 MG 24 hr tablet      Meds ordered this encounter  Medications  . amLODipine (NORVASC) 10 MG tablet    Sig: Take 1 tablet (10 mg total) by mouth daily.    Dispense:  90 tablet    Refill:  1    Order Specific Question:   Supervising Provider    Answer:   Olin Hauser [[id number]]  . atorvastatin (LIPITOR) 20 MG tablet    Sig: Take 1 tablet (20 mg total) by mouth daily.    Dispense:  90 tablet    Refill:  0    NEED LABS prior to additional fills    Order Specific Question:   Supervising Provider    Answer:   Olin Hauser [[id number]]  . metoprolol succinate (TOPROL-XL) 50 MG 24 hr tablet    Sig: Take 1 tablet (50 mg total) by mouth daily.    Dispense:  90 tablet    Refill:  1    Order Specific Question:   Supervising Provider    Answer:   Olin Hauser [[id number]]  . omeprazole (PRILOSEC) 20 MG capsule    Sig: Take 1 capsule (20 mg total) by mouth daily.    Dispense:  90 capsule    Refill:  1    Order Specific Question:   Supervising Provider    Answer:   Olin Hauser [[id number]]   - Time spent in direct consultation with patient via telemedicine about above concerns: 6 minutes Patient cut visit short due to running out of telephone minutes.  Essential information was reviewed.  Patient encouraged to get labs ASAP for cholesterol due to safety of prescribing medications, prevention of future ASCVD events.  Follow up plan: Return in about 3 months (around 02/20/2019) for htn, cholesterol with labs prior to visit (Lipid).  [name], DNP, AGPCNP-BC Adult Gerontology Primary Care Nurse Practitioner [facility] 11/21/2018, 10:06 AM

## 2018-12-19 DIAGNOSIS — R809 Proteinuria, unspecified: Secondary | ICD-10-CM | POA: Diagnosis not present

## 2018-12-19 DIAGNOSIS — N184 Chronic kidney disease, stage 4 (severe): Secondary | ICD-10-CM | POA: Diagnosis not present

## 2018-12-19 DIAGNOSIS — D631 Anemia in chronic kidney disease: Secondary | ICD-10-CM | POA: Diagnosis not present

## 2018-12-19 DIAGNOSIS — N041 Nephrotic syndrome with focal and segmental glomerular lesions: Secondary | ICD-10-CM | POA: Diagnosis not present

## 2018-12-19 DIAGNOSIS — N2581 Secondary hyperparathyroidism of renal origin: Secondary | ICD-10-CM | POA: Diagnosis not present

## 2018-12-19 DIAGNOSIS — I10 Essential (primary) hypertension: Secondary | ICD-10-CM | POA: Diagnosis not present

## 2018-12-29 ENCOUNTER — Telehealth: Payer: Self-pay

## 2019-01-02 ENCOUNTER — Other Ambulatory Visit: Payer: Self-pay | Admitting: Nurse Practitioner

## 2019-01-02 DIAGNOSIS — J301 Allergic rhinitis due to pollen: Secondary | ICD-10-CM

## 2019-02-15 NOTE — Telephone Encounter (Signed)
I spoke with patient concerning her Hep B vaccine. She want to postpone her next injection appt due to the pandemic.

## 2019-03-08 ENCOUNTER — Other Ambulatory Visit: Payer: Self-pay | Admitting: Nurse Practitioner

## 2019-03-08 DIAGNOSIS — E782 Mixed hyperlipidemia: Secondary | ICD-10-CM

## 2019-04-03 ENCOUNTER — Other Ambulatory Visit: Payer: Self-pay | Admitting: Nurse Practitioner

## 2019-04-03 DIAGNOSIS — K219 Gastro-esophageal reflux disease without esophagitis: Secondary | ICD-10-CM

## 2019-04-10 ENCOUNTER — Telehealth: Payer: Self-pay | Admitting: Nurse Practitioner

## 2019-04-10 NOTE — Chronic Care Management (AMB) (Signed)
Chronic Care Management   Note  04/10/2019 Name: LAVERTA HARNISCH MRN: [id number] DOB: [birth year]  ARILYN BRIERLEY is a 68 y.o. year old female who is a primary care patient of Mikey College, NP. I reached out to Rosario Jacks by phone today in response to a referral sent by Ms. Renella Cunas Hon's health plan.    Ms. Veldhuizen was given information about Chronic Care Management services today including:  1. CCM service includes personalized support from designated clinical staff supervised by her physician, including individualized plan of care and coordination with other care providers 2. 24/7 contact phone numbers for assistance for urgent and routine care needs. 3. Service will only be billed when office clinical staff spend 20 minutes or more in a month to coordinate care. 4. Only one practitioner may furnish and bill the service in a calendar month. 5. The patient may stop CCM services at any time (effective at the end of the month) by phone call to the office staff. 6. The patient will be responsible for cost sharing (co-pay) of up to 20% of the service fee (after annual deductible is met).  Patient did not agree to enrollment in care management services and does not wish to consider at this time.  Follow up plan: The patient has been provided with contact information for the chronic care management team and has been advised to call with any health related questions or concerns.   Victor  [email or web address]   ??[phone]

## 2019-04-12 ENCOUNTER — Other Ambulatory Visit: Payer: Self-pay | Admitting: Nurse Practitioner

## 2019-04-12 DIAGNOSIS — I10 Essential (primary) hypertension: Secondary | ICD-10-CM

## 2019-04-17 ENCOUNTER — Ambulatory Visit: Payer: Medicare HMO | Admitting: Nurse Practitioner

## 2019-04-20 ENCOUNTER — Encounter: Payer: Self-pay | Admitting: Nurse Practitioner

## 2019-04-20 ENCOUNTER — Ambulatory Visit (INDEPENDENT_AMBULATORY_CARE_PROVIDER_SITE_OTHER): Payer: Medicare HMO | Admitting: Nurse Practitioner

## 2019-04-20 ENCOUNTER — Other Ambulatory Visit: Payer: Self-pay

## 2019-04-20 VITALS — BP 148/93 | HR 76 | Temp 98.4°F | Resp 18 | Wt 212.4 lb

## 2019-04-20 DIAGNOSIS — E782 Mixed hyperlipidemia: Secondary | ICD-10-CM

## 2019-04-20 DIAGNOSIS — I120 Hypertensive chronic kidney disease with stage 5 chronic kidney disease or end stage renal disease: Secondary | ICD-10-CM | POA: Diagnosis not present

## 2019-04-20 DIAGNOSIS — I10 Essential (primary) hypertension: Secondary | ICD-10-CM | POA: Diagnosis not present

## 2019-04-20 DIAGNOSIS — K219 Gastro-esophageal reflux disease without esophagitis: Secondary | ICD-10-CM | POA: Diagnosis not present

## 2019-04-20 DIAGNOSIS — N184 Chronic kidney disease, stage 4 (severe): Secondary | ICD-10-CM | POA: Diagnosis not present

## 2019-04-20 DIAGNOSIS — N2581 Secondary hyperparathyroidism of renal origin: Secondary | ICD-10-CM | POA: Diagnosis not present

## 2019-04-20 DIAGNOSIS — I129 Hypertensive chronic kidney disease with stage 1 through stage 4 chronic kidney disease, or unspecified chronic kidney disease: Secondary | ICD-10-CM

## 2019-04-20 DIAGNOSIS — N185 Chronic kidney disease, stage 5: Secondary | ICD-10-CM | POA: Diagnosis not present

## 2019-04-20 LAB — COMPLETE METABOLIC PANEL WITH GFR
AG Ratio: 1.1 (calc) (ref 1.0–2.5)
ALT: 18 U/L (ref 6–29)
AST: 26 U/L (ref 10–35)
Albumin: 4.1 g/dL (ref 3.6–5.1)
Alkaline phosphatase (APISO): 80 U/L (ref 37–153)
BUN/Creatinine Ratio: 16 (calc) (ref 6–22)
BUN: 61 mg/dL — ABNORMAL HIGH (ref 7–25)
CO2: 18 mmol/L — ABNORMAL LOW (ref 20–32)
Calcium: 8.9 mg/dL (ref 8.6–10.4)
Chloride: 110 mmol/L (ref 98–110)
Creat: 3.85 mg/dL — ABNORMAL HIGH (ref 0.50–0.99)
GFR, Est African American: 13 mL/min/{1.73_m2} — ABNORMAL LOW (ref >=60)
GFR, Est Non African American: 11 mL/min/{1.73_m2} — ABNORMAL LOW (ref >=60)
Globulin: 3.6 g/dL (calc) (ref 1.9–3.7)
Glucose, Bld: 111 mg/dL — ABNORMAL HIGH (ref 65–99)
Potassium: 3.9 mmol/L (ref 3.5–5.3)
Sodium: 139 mmol/L (ref 135–146)
Total Bilirubin: 0.5 mg/dL (ref 0.2–1.2)
Total Protein: 7.7 g/dL (ref 6.1–8.1)

## 2019-04-20 LAB — CBC WITH DIFFERENTIAL/PLATELET
Absolute Monocytes: 679 cells/uL (ref 200–950)
Basophils Absolute: 93 cells/uL (ref 0–200)
Basophils Relative: 1 %
Eosinophils Absolute: 660 cells/uL — ABNORMAL HIGH (ref 15–500)
Eosinophils Relative: 7.1 %
HCT: 33.7 % — ABNORMAL LOW (ref 35.0–45.0)
Hemoglobin: 10.7 g/dL — ABNORMAL LOW (ref 11.7–15.5)
Lymphs Abs: 3543 cells/uL (ref 850–3900)
MCH: 31.5 pg (ref 27.0–33.0)
MCHC: 31.8 g/dL — ABNORMAL LOW (ref 32.0–36.0)
MCV: 99.1 fL (ref 80.0–100.0)
MPV: 10.5 fL (ref 7.5–12.5)
Monocytes Relative: 7.3 %
Neutro Abs: 4325 cells/uL (ref 1500–7800)
Neutrophils Relative %: 46.5 %
Platelets: 245 10*3/uL (ref 140–400)
RBC: 3.4 10*6/uL — ABNORMAL LOW (ref 3.80–5.10)
RDW: 12.6 % (ref 11.0–15.0)
Total Lymphocyte: 38.1 %
WBC: 9.3 10*3/uL (ref 3.8–10.8)

## 2019-04-20 LAB — LIPID PANEL
Cholesterol: 130 mg/dL (ref <200)
HDL: 78 mg/dL (ref >=50)
LDL Cholesterol (Calc): 38 mg/dL (calc)
Non-HDL Cholesterol (Calc): 52 mg/dL (calc) (ref <130)
Total CHOL/HDL Ratio: 1.7 (calc) (ref <5.0)
Triglycerides: 60 mg/dL (ref <150)

## 2019-04-20 MED ORDER — METOPROLOL SUCCINATE ER 100 MG PO TB24: 100.0000 mg | ORAL_TABLET | Freq: Every day | ORAL | 1 refills | Status: DC

## 2019-04-20 NOTE — Progress Notes (Signed)
Subjective:    Patient ID: Destiny Adams, female    DOB: June 04, 1951, 68 y.o.   MRN: [id number]  Destiny Adams is a 67 y.o. female presenting on 04/20/2019 for Hypertension (follow up )  HPI  Hypertension CKD Stage 4 - She is checking BP at home or outside of clinic.  Readings 134/84 usually  - Current medications: amlodipine 10 mg once daily, losartan 50 mg once daily, metoprolol succinate 50 mg once daily, tolerating well without side effects - She is not currently symptomatic. - Pt denies headache, lightheadedness, dizziness, changes in vision, chest tightness/pressure, palpitations, leg swelling, sudden loss of speech or loss of consciousness. - She reports no regular exercise routine, walks occasionally. - Her diet is moderate in salt, moderate in fat, and moderate in carbohydrates.   Hyperlipidemia Patient is taking atorvastatin 20 mg once daily.   - Pt denies changes in vision, chest tightness/pressure, palpitations, shortness of breath, leg pain while walking, leg or arm weakness, and sudden loss of speech or loss of consciousness.   GERD - She is not currently symptomatic. She takes omeprazole 20 mg once daily and tolerates without side effects.   - Symptoms appear to be worsened by trigger foods - known.   - She denies melena, hematochezia, hematemesis, and coffee ground emesis.   Social History   Tobacco Use  . Smoking status: Never Smoker  . Smokeless tobacco: Never Used  Substance Use Topics  . Alcohol use: No  . Drug use: No    Review of Systems Per HPI unless specifically indicated above     Objective:    BP (!) 148/93 (BP Location: Right Arm, Patient Position: Sitting, Cuff Size: Normal)   Pulse 76   Temp 98.4 F (36.9 C) (Oral)   Resp 18   Wt 212 lb 6.4 oz (96.3 kg)   SpO2 100%   BMI 40.13 kg/m   Wt Readings from Last 3 Encounters:  04/20/19 212 lb 6.4 oz (96.3 kg)  08/22/18 197 lb 3.2 oz (89.4 kg)  06/02/18 188 lb 12.8 oz (85.6 kg)     Physical Exam Vitals signs reviewed.  Constitutional:      General: She is awake. She is not in acute distress.    Appearance: She is well-developed.  HENT:     Head: Normocephalic and atraumatic.  Neck:     Musculoskeletal: Normal range of motion and neck supple.     Vascular: No carotid bruit.  Cardiovascular:     Rate and Rhythm: Normal rate and regular rhythm.     Pulses:          Radial pulses are 2+ on the right side and 2+ on the left side.       Posterior tibial pulses are 1+ on the right side and 1+ on the left side.     Heart sounds: Normal heart sounds, S1 normal and S2 normal.  Pulmonary:     Effort: Pulmonary effort is normal. No respiratory distress.     Breath sounds: Normal breath sounds and air entry.  Skin:    General: Skin is warm and dry.  Neurological:     Mental Status: She is alert and oriented to person, place, and time.  Psychiatric:        Attention and Perception: Attention normal.        Mood and Affect: Mood and affect normal.        Behavior: Behavior normal. Behavior is cooperative.    Results  for orders placed or performed in visit on 11/21/18  COMPLETE METABOLIC PANEL WITH GFR  Result Value Ref Range   Glucose, Bld 111 (H) 65 - 99 mg/dL   BUN 61 (H) 7 - 25 mg/dL   Creat 3.85 (H) 0.50 - 0.99 mg/dL   GFR, Est Non African American 11 (L) > OR = 60 mL/min/1.22m2   GFR, Est African American 13 (L) > OR = 60 mL/min/1.46m2   BUN/Creatinine Ratio 16 6 - 22 (calc)   Sodium 139 135 - 146 mmol/L   Potassium 3.9 3.5 - 5.3 mmol/L   Chloride 110 98 - 110 mmol/L   CO2 18 (L) 20 - 32 mmol/L   Calcium 8.9 8.6 - 10.4 mg/dL   Total Protein 7.7 6.1 - 8.1 g/dL   Albumin 4.1 3.6 - 5.1 g/dL   Globulin 3.6 1.9 - 3.7 g/dL (calc)   AG Ratio 1.1 1.0 - 2.5 (calc)   Total Bilirubin 0.5 0.2 - 1.2 mg/dL   Alkaline phosphatase (APISO) 80 37 - 153 U/L   AST 26 10 - 35 U/L   ALT 18 6 - 29 U/L  Lipid panel  Result Value Ref Range   Cholesterol 130 <200 mg/dL   HDL  78 > OR = 50 mg/dL   Triglycerides 60 <150 mg/dL   LDL Cholesterol (Calc) 38 mg/dL (calc)   Total CHOL/HDL Ratio 1.7 <5.0 (calc)   Non-HDL Cholesterol (Calc) 52 <130 mg/dL (calc)  CBC with Differential/Platelet  Result Value Ref Range   WBC 9.3 3.8 - 10.8 Thousand/uL   RBC 3.40 (L) 3.80 - 5.10 Million/uL   Hemoglobin 10.7 (L) 11.7 - 15.5 g/dL   HCT 33.7 (L) 35.0 - 45.0 %   MCV 99.1 80.0 - 100.0 fL   MCH 31.5 27.0 - 33.0 pg   MCHC 31.8 (L) 32.0 - 36.0 g/dL   RDW 12.6 11.0 - 15.0 %   Platelets 245 140 - 400 Thousand/uL   MPV 10.5 7.5 - 12.5 fL   Neutro Abs 4,325 1,500 - 7,800 cells/uL   Lymphs Abs 3,543 850 - 3,900 cells/uL   Absolute Monocytes 679 200 - 950 cells/uL   Eosinophils Absolute 660 (H) 15 - 500 cells/uL   Basophils Absolute 93 0 - 200 cells/uL   Neutrophils Relative % 46.5 %   Total Lymphocyte 38.1 %   Monocytes Relative 7.3 %   Eosinophils Relative 7.1 %   Basophils Relative 1.0 %      Assessment & Plan:   Problem List Items Addressed This Visit      Cardiovascular and Mediastinum   Hypertension - Primary Uncontrolled hypertension.  BP goal < 130/80.  Pt is maintaining prior lifestyle modifications.  Taking medications tolerating well without side effects. No current complications.  Plan: 1. Continue taking losartan - increase metoprolol to 100 mg daily 2. Obtain labs  Next visit.  Recent labs reviewed today. 3. Encouraged heart healthy diet and increasing exercise to 30 minutes most days of the week. 4. Check BP 1-2 x per week at home, keep log, and bring to clinic at next appointment. 5. Follow up 3 months.     Relevant Medications   metoprolol succinate (TOPROL-XL) 100 MG 24 hr tablet     Digestive   GERD (gastroesophageal reflux disease) Stable today on exam.  Medications tolerated without side effects.  Continue at current doses.  Refills provided.  Check labs today. Followup 3 months.      Genitourinary   CKD stage 5  secondary to hypertension  ([place]) Stable from last labs.  Need regular follow-up.  Labs every 2-3 months.  Improve bp control.     Other   Hyperlipidemia Stable today on exam.  Medications tolerated without side effects.  Continue at current doses.  Refills provided.  labs net visit. Followup 3 months.    Relevant Medications   metoprolol succinate (TOPROL-XL) 100 MG 24 hr tablet      Meds ordered this encounter  Medications  . metoprolol succinate (TOPROL-XL) 100 MG 24 hr tablet    Sig: Take 1 tablet (100 mg total) by mouth daily.    Dispense:  90 tablet    Refill:  1    Order Specific Question:   Supervising Provider    Answer:   Olin Hauser [[id number]]    Follow up plan: Return in about 3 months (around 07/20/2019) for hypertension, kidneys AND labs.  [name], DNP, AGPCNP-BC Adult Gerontology Primary Care Nurse Practitioner [facility] 04/20/2019, 10:07 AM

## 2019-04-20 NOTE — Patient Instructions (Addendum)
Destiny Adams,   Thank you for coming in to clinic today.  1. Your blood pressure remains a little high for protecting your kidneys - INCREASE metoprolol succinate to 100 mg once daily.  You can take two 50 mg tablets until you get your new supply of 100 mg tablets.  Then only take one daily.  2. Continue all other medications without changes today.  Things really are going fairly well by your reports.  Labs will be back and called to you on Tuesday.  Please schedule a follow-up appointment with [name], AGNP. Return in about 3 months (around 07/20/2019) for hypertension, kidneys AND labs.  If you have any other questions or concerns, please feel free to call the clinic or send a message through [place]. You may also schedule an earlier appointment if necessary.  You will receive a survey after today's visit either digitally by e-mail or paper by [employer]. Your experiences and feedback matter to [place].  Please respond so we know how we are doing as we provide care for you.   [name], DNP, AGNP-BC Adult Gerontology Nurse Practitioner [place]

## 2019-04-21 ENCOUNTER — Telehealth: Payer: Self-pay

## 2019-04-21 ENCOUNTER — Other Ambulatory Visit: Payer: Self-pay | Admitting: Nurse Practitioner

## 2019-04-21 DIAGNOSIS — E782 Mixed hyperlipidemia: Secondary | ICD-10-CM

## 2019-04-21 MED ORDER — ATORVASTATIN CALCIUM 10 MG PO TABS: 20.0000 mg | ORAL_TABLET | Freq: Every day | ORAL | 3 refills | Status: DC

## 2019-04-21 NOTE — Telephone Encounter (Signed)
-----  Message from Mikey College, NP sent at 04/21/2019 12:09 PM EDT ----- CMP: Glucose - improved over 1 year ago, remains mildly elevated Kidney function: mild worsening over last 1 year.  Need to target BP to goal of < 130/80.  Continue BP med plan from this week's visit. Liver function: normal  Lipid panel: normal with LDL at goal.  Can reduce atorvastatin to 10 mg once daily.  New prescription is sent.  CBC: Mild anemia, possibly due to renal failure. Encourage you to eat high iron foods (##mail letter).  Need to rule out GI Bleed as possible cause.  Monitor for black bowel movements.  Recommend 3 card hemoccult testing.  Patient can come to office to pickup and drop off.  ## Please get a kit ready for her to pickup.

## 2019-04-21 NOTE — Telephone Encounter (Signed)
Thank you for trying.  Will readdress if needed in future.

## 2019-04-21 NOTE — Telephone Encounter (Signed)
Patient advised as below. Patient reports she does not want to pickup hemoccult testing cards. Patient reports she has seen GI and had a colonoscopy at the beginning of this year.

## 2019-04-26 ENCOUNTER — Other Ambulatory Visit: Payer: Self-pay

## 2019-04-26 ENCOUNTER — Ambulatory Visit (INDEPENDENT_AMBULATORY_CARE_PROVIDER_SITE_OTHER): Payer: Medicare HMO

## 2019-04-26 DIAGNOSIS — Z23 Encounter for immunization: Secondary | ICD-10-CM | POA: Diagnosis not present

## 2019-04-27 ENCOUNTER — Encounter: Payer: Self-pay | Admitting: Nurse Practitioner

## 2019-05-11 ENCOUNTER — Other Ambulatory Visit: Payer: Self-pay | Admitting: Nurse Practitioner

## 2019-05-11 DIAGNOSIS — J301 Allergic rhinitis due to pollen: Secondary | ICD-10-CM

## 2019-05-15 IMAGING — US US RENAL
1 series · 14 of 25 positions shown · non-contrast
Comparison: None.

CLINICAL DATA: 67-year-old female with chronic kidney disease stage
3. Initial encounter.

EXAM:
RENAL / URINARY TRACT ULTRASOUND COMPLETE

[Series 1: us renal · 0.23mm/px · 14 of 45 slices shown]
[im 1/45]
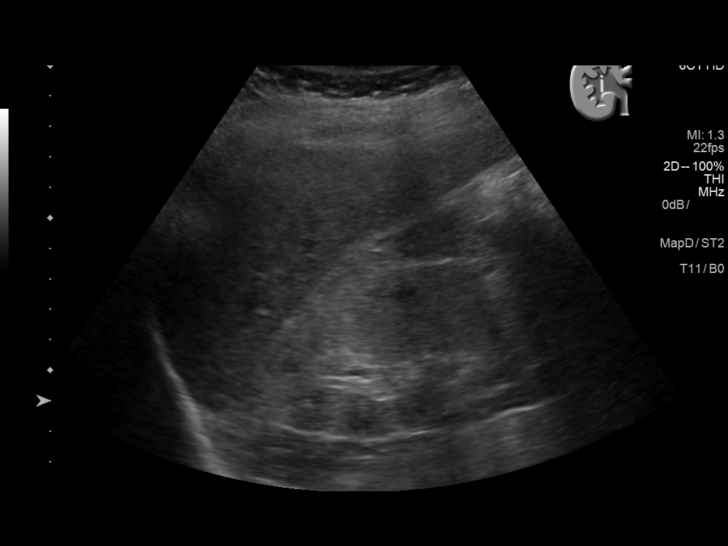
[im 4/45]
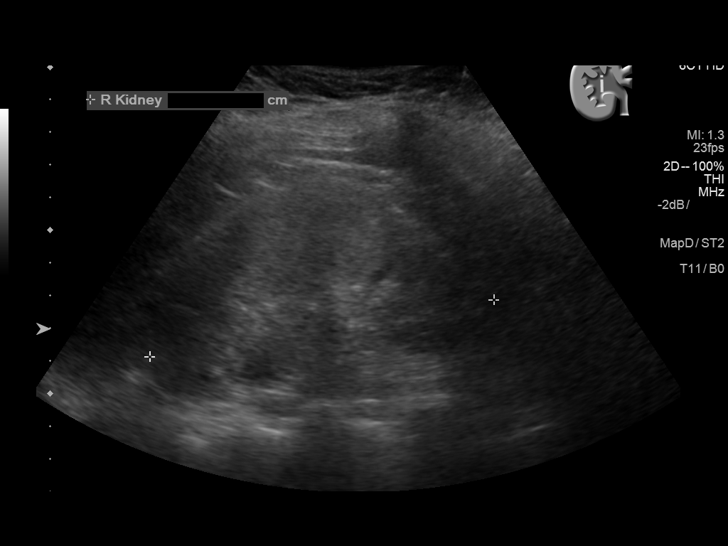
[im 8/45]
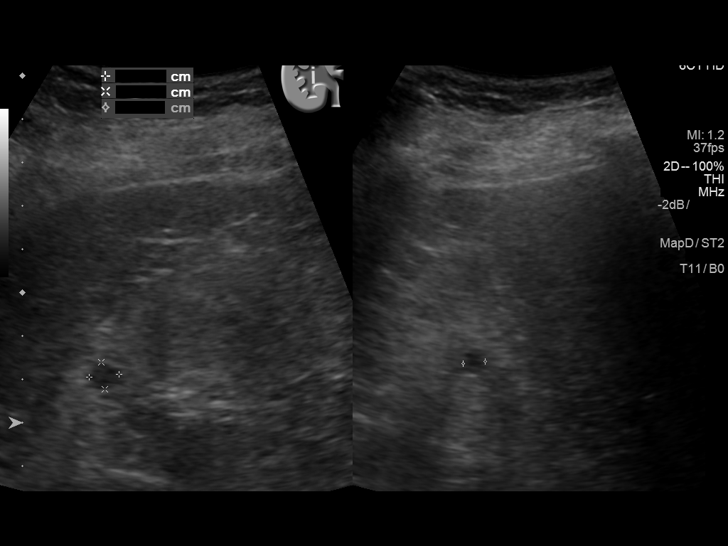
[im 12/45]
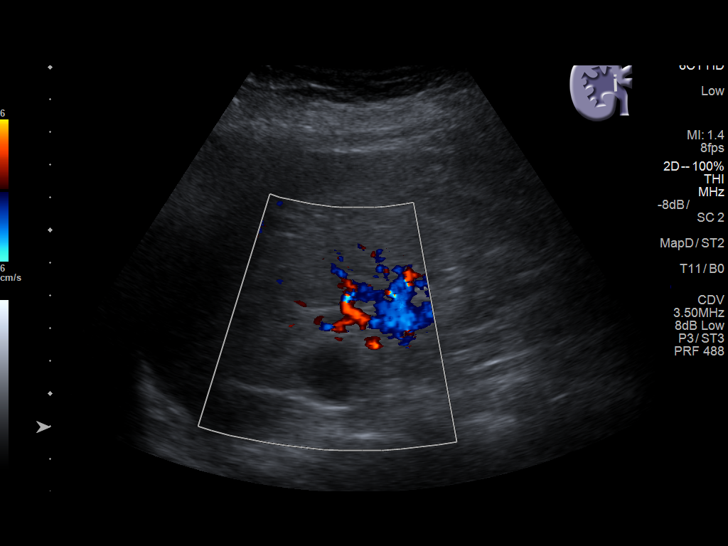
[im 15/45]
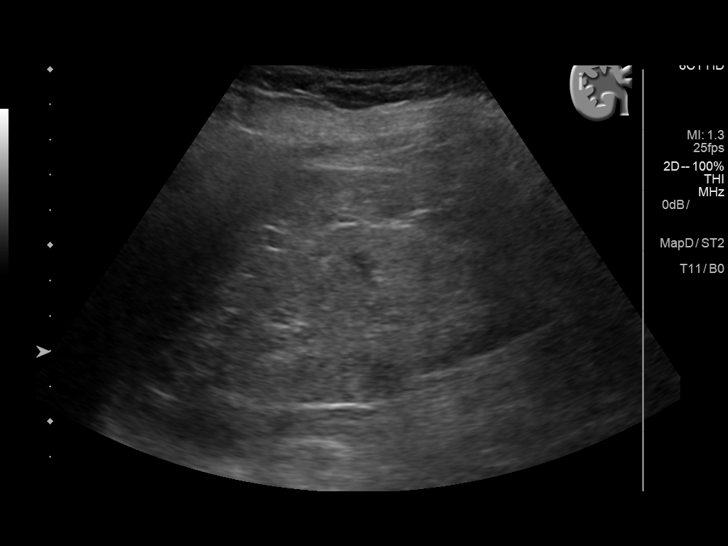
[im 17/45]
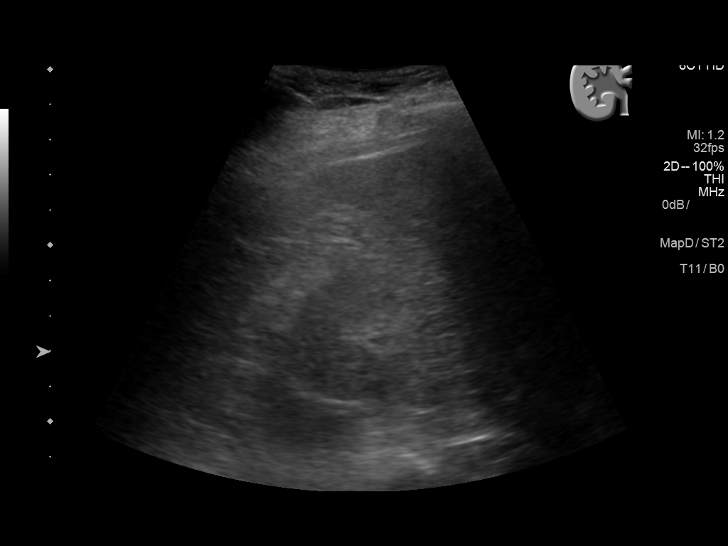
[im 21/45]
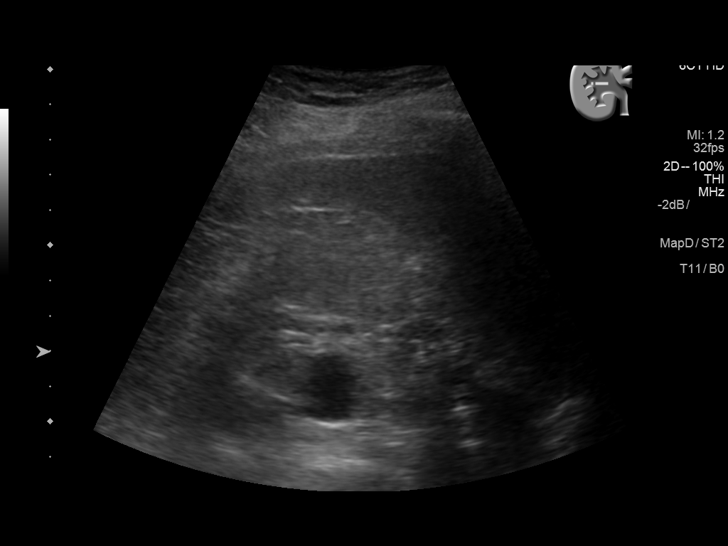
[im 24/45]
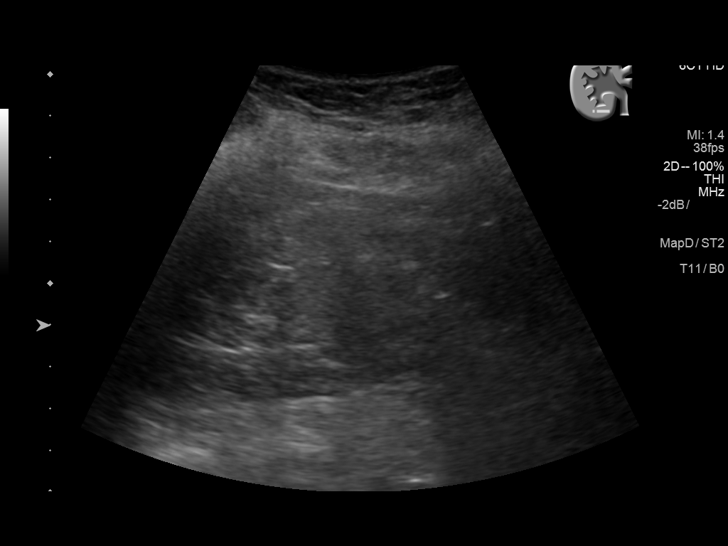
[im 28/45]
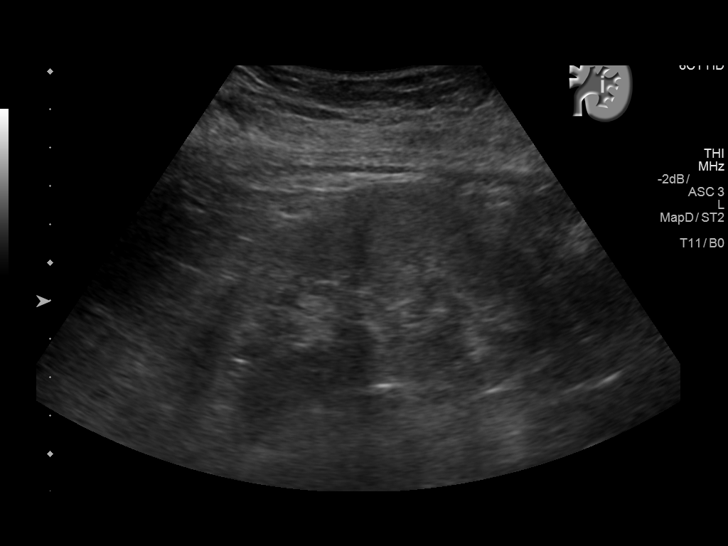
[im 30/45]
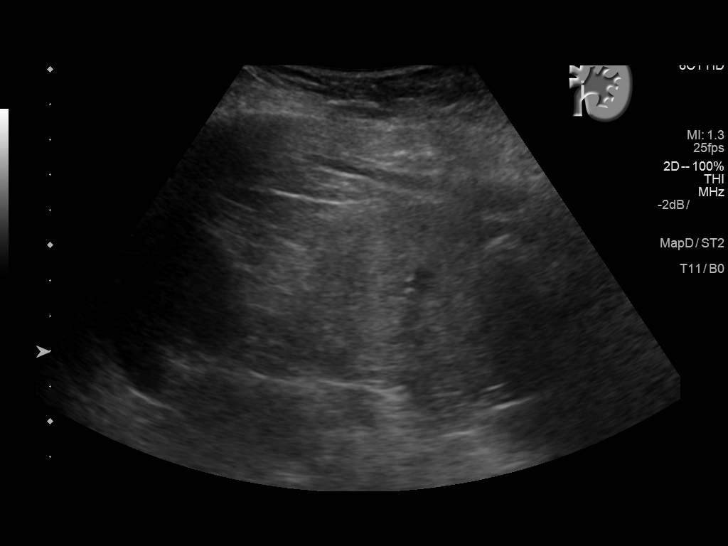
[im 34/45]
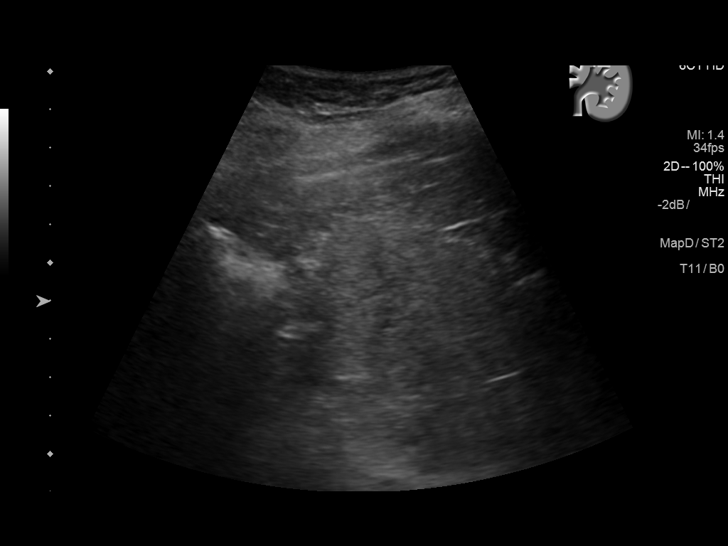
[im 37/45]
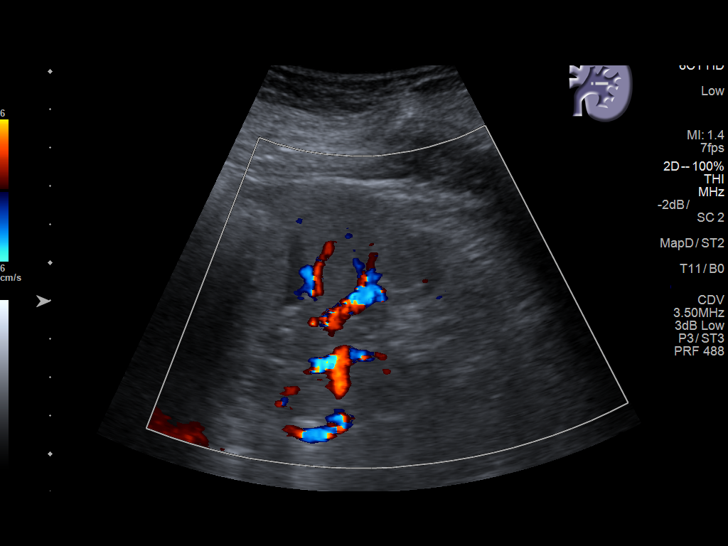
[im 41/45]
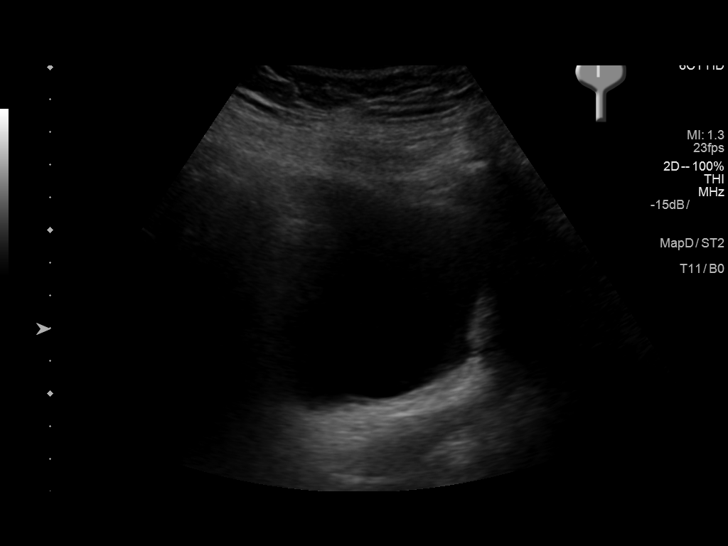
[im 45/45]
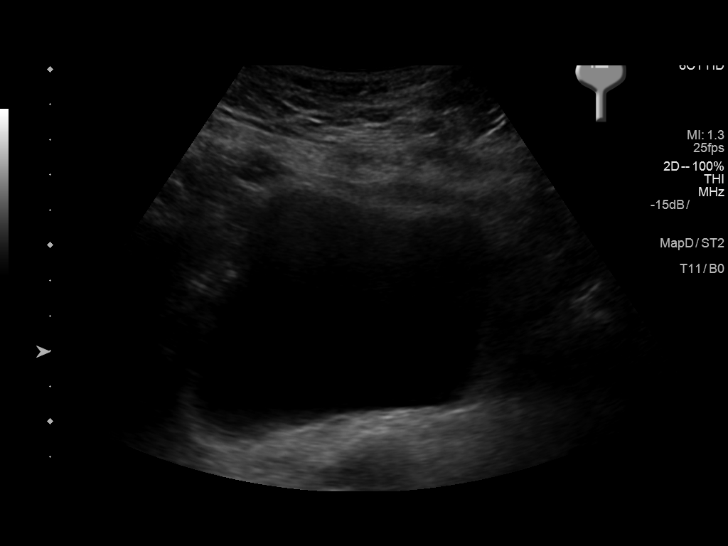

[14 of 25 positions shown; findings below may reference images not displayed]

FINDINGS: Right Kidney:

Length: 10.7 cm. Increased echogenicity. No hydronephrosis. Upper
pole 1 cm and 2 cm cyst.

Left Kidney:

Length: 9.4 cm.. Increased echogenicity. No hydronephrosis or mass.

Bladder:

Appears normal for degree of bladder distention.
IMPRESSION: Increased renal parenchyma echogenicity bilaterally suggestive of
result of medical renal disease.

No hydronephrosis.

Right upper pole renal cysts measure up to 2 cm.

## 2019-05-29 ENCOUNTER — Other Ambulatory Visit: Payer: Self-pay | Admitting: Family Medicine

## 2019-05-29 DIAGNOSIS — J301 Allergic rhinitis due to pollen: Secondary | ICD-10-CM

## 2019-07-19 ENCOUNTER — Ambulatory Visit (INDEPENDENT_AMBULATORY_CARE_PROVIDER_SITE_OTHER): Payer: Medicare HMO | Admitting: Family Medicine

## 2019-07-19 ENCOUNTER — Encounter: Payer: Self-pay | Admitting: Family Medicine

## 2019-07-19 ENCOUNTER — Ambulatory Visit: Payer: Medicare HMO | Admitting: Nurse Practitioner

## 2019-07-19 ENCOUNTER — Other Ambulatory Visit: Payer: Self-pay

## 2019-07-19 DIAGNOSIS — I10 Essential (primary) hypertension: Secondary | ICD-10-CM

## 2019-07-19 DIAGNOSIS — N185 Chronic kidney disease, stage 5: Secondary | ICD-10-CM | POA: Diagnosis not present

## 2019-07-19 DIAGNOSIS — I120 Hypertensive chronic kidney disease with stage 5 chronic kidney disease or end stage renal disease: Secondary | ICD-10-CM | POA: Diagnosis not present

## 2019-07-19 DIAGNOSIS — R197 Diarrhea, unspecified: Secondary | ICD-10-CM | POA: Diagnosis not present

## 2019-07-19 NOTE — Patient Instructions (Addendum)
AVS given by phone.  Please schedule a Follow-up Appointment to: Return in about 3 months (around 10/17/2019) for 3 month follow-up CKD with new provider.  If you have any other questions or concerns, please feel free to call the office or send a message through [place]. You may also schedule an earlier appointment if necessary.  Additionally, you may be receiving a survey about your experience at our office within a few days to 1 week by e-mail or mail. We value your feedback.  Nobie Putnam, DO [place]

## 2019-07-19 NOTE — Progress Notes (Signed)
Virtual Visit via Telephone The purpose of this virtual visit is to provide medical care while limiting exposure to the novel coronavirus (COVID19) for both patient and office staff.  Consent was obtained for phone visit:  Yes.   Answered questions that patient had about telehealth interaction:  Yes.   I discussed the limitations, risks, security and privacy concerns of performing an evaluation and management service by telephone. I also discussed with the patient that there may be a patient responsible charge related to this service. The patient expressed understanding and agreed to proceed.  Patient Location: Home Provider Location: [place] [facility])  ---------------------------------------------------------------------- Chief Complaint  Patient presents with  . Diarrhea    onset 2 day     S: Reviewed CMA documentation. I have called patient and gathered additional HPI as follows:  Diarrhea Reports new problem for past 2 days, attributes it to recent food eaten and seems to have improved now. She has no new concerns with this. Not interested in OTC or other treatment for it. She is tolerating PO well. Denies abdominal pain or other symptoms.  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 She was concerned about returning to Nephrology due to the frequency of visits and testing done in past. Current Meds - Amlodipine 10mg  daily, Losartan 50mg  daily, Metoprolol XL 100mg  daily   Reports good compliance, took meds today. Tolerating well, w/o complaints. Denies CP, dyspnea, HA, edema, dizziness / lightheadedness  History of Hepatitis C Treated by [facility] in 2020.  Denies any high risk travel to areas of current concern for COVID19. Denies any known or suspected exposure to person with or possibly with COVID19.  Denies any fevers, chills, sweats, body ache, cough, shortness of breath,  sinus pain or pressure, headache, abdominal pain  Past Medical History:  Diagnosis Date  . Anemia   . CKD (chronic kidney disease) stage 4, GFR 15-29 ml/min (HCC) 07/23/2017  . GERD (gastroesophageal reflux disease)   . Hyperlipidemia   . Hypertension   . Severe obesity (BMI 35.0-35.9 with comorbidity) ([place]) 07/23/2017   Comorbid CKD stage IV, Hypertension, Hyperlipidemia   Social History   Tobacco Use  . Smoking status: Never Smoker  . Smokeless tobacco: Never Used  Substance Use Topics  . Alcohol use: No  . Drug use: No    Current Outpatient Medications:  .  amLODipine (NORVASC) 10 MG tablet, TAKE 1 TABLET EVERY DAY, Disp: 90 tablet, Rfl: 0 .  atorvastatin (LIPITOR) 10 MG tablet, Take 2 tablets (20 mg total) by mouth daily., Disp: 90 tablet, Rfl: 3 .  calcitRIOL (ROCALTROL) 0.5 MCG capsule, Take 0.5 mcg by mouth daily., Disp: , Rfl:  .  diclofenac sodium (VOLTAREN) 1 % GEL, Apply 2 g topically 4 (four) times daily as needed (low back pain, arthritis)., Disp: 100 g, Rfl: 3 .  fluticasone (FLONASE) 50 MCG/ACT nasal spray, USE 2 SPRAYS IN EACH NOSTRIL EVERY DAY, Disp: 48 g, Rfl: 6 .  levocetirizine (XYZAL) 5 MG tablet, Take 1 tablet (5 mg total) by mouth every Monday, Wednesday, and Friday. Dose changed due to renal function at last check., Disp: 60 tablet, Rfl: 1 .  loratadine (CLARITIN) 10 MG tablet, Take 10 mg by mouth daily., Disp: , Rfl:  .  losartan (COZAAR) 50 MG tablet, Take 50 mg by mouth daily., Disp: , Rfl:  .  metoprolol succinate (TOPROL-XL) 100 MG 24 hr tablet,  Take 1 tablet (100 mg total) by mouth daily., Disp: 90 tablet, Rfl: 1 .  omeprazole (PRILOSEC) 20 MG capsule, TAKE 1 CAPSULE EVERY DAY, Disp: 90 capsule, Rfl: 1 .  SODIUM BICARBONATE PO, Take 10 g by mouth., Disp: , Rfl:   Depression screen [facility] 2/9 07/19/2019 04/20/2019 12/14/2017  Decreased Interest 0 0 0  Down, Depressed, Hopeless 0 0 0  PHQ - 2 Score 0 0 0  Altered sleeping - 0 3  Tired, decreased energy - 0 1   Change in appetite - 0 0  Feeling bad or failure about yourself  - 0 0  Trouble concentrating - 0 0  Moving slowly or fidgety/restless - 0 0  Suicidal thoughts - 0 0  PHQ-9 Score - 0 4  Difficult doing work/chores - - Not difficult at all    No flowsheet data found.  -------------------------------------------------------------------------- O: No physical exam performed due to remote telephone encounter.  Lab results reviewed.  No results found for this or any previous visit (from the past 2160 hour(s)).     Chemistry      Component Value Date/Time   NA 139 04/20/2019 0939   K 3.9 04/20/2019 0939   CL 110 04/20/2019 0939   CO2 18 (L) 04/20/2019 0939   BUN 61 (H) 04/20/2019 0939   CREATININE 3.85 (H) 04/20/2019 0939      Component Value Date/Time   CALCIUM 8.9 04/20/2019 0939   ALKPHOS 103 03/16/2018 0801   AST 26 04/20/2019 0939   ALT 18 04/20/2019 0939   BILITOT 0.5 04/20/2019 0939      -------------------------------------------------------------------------- A&P:  Problem List Items Addressed This Visit    Hypertension   CKD stage 5 secondary to hypertension ([place]) - Primary    Other Visit Diagnoses    Diarrhea, unspecified type         HTN CKD IV to V Complex history with progressive CKD approaching ESRD in setting of HTN Followed by [facility] Dr Holley Raring Her BP has been controlled on current regimen. Last labs 04/2019 Expressed my concerns that her kidney function is seriously low and she would need to pursue Nephrology follow-up. Will send message to Dr Holley Raring as well.  Seek care at hospital if acute severe symptoms.  Diarrhea - self limited, resolving now.  No orders of the defined types were placed in this encounter.   Follow-up: - Return in 3 months for HTN CKD follow-up with new provider  Patient verbalizes understanding with the above medical recommendations including the limitation of remote medical advice.  Specific follow-up and  call-back criteria were given for patient to follow-up or seek medical care more urgently if needed.   - Time spent in direct consultation with patient on phone: 8 minutes   Nobie Putnam, [facility] 07/19/2019, 9:42 AM

## 2019-07-26 ENCOUNTER — Other Ambulatory Visit: Payer: Self-pay | Admitting: Nurse Practitioner

## 2019-07-26 DIAGNOSIS — I10 Essential (primary) hypertension: Secondary | ICD-10-CM

## 2019-08-23 ENCOUNTER — Other Ambulatory Visit: Payer: Self-pay | Admitting: Nurse Practitioner

## 2019-08-23 DIAGNOSIS — K219 Gastro-esophageal reflux disease without esophagitis: Secondary | ICD-10-CM

## 2019-08-30 ENCOUNTER — Other Ambulatory Visit: Payer: Self-pay | Admitting: Nurse Practitioner

## 2019-08-30 DIAGNOSIS — I10 Essential (primary) hypertension: Secondary | ICD-10-CM

## 2019-08-30 DIAGNOSIS — E782 Mixed hyperlipidemia: Secondary | ICD-10-CM

## 2019-09-20 DIAGNOSIS — Z23 Encounter for immunization: Secondary | ICD-10-CM | POA: Diagnosis not present

## 2019-09-22 ENCOUNTER — Other Ambulatory Visit: Payer: Self-pay | Admitting: Family Medicine

## 2019-09-22 DIAGNOSIS — I10 Essential (primary) hypertension: Secondary | ICD-10-CM

## 2019-10-17 ENCOUNTER — Other Ambulatory Visit: Payer: Self-pay | Admitting: Family Medicine

## 2019-10-17 ENCOUNTER — Ambulatory Visit (INDEPENDENT_AMBULATORY_CARE_PROVIDER_SITE_OTHER): Payer: Medicare HMO

## 2019-10-17 ENCOUNTER — Ambulatory Visit: Payer: Medicare HMO | Admitting: Family Medicine

## 2019-10-17 VITALS — Ht 61.0 in | Wt 200.0 lb

## 2019-10-17 DIAGNOSIS — Z0000 Encounter for general adult medical examination without abnormal findings: Secondary | ICD-10-CM

## 2019-10-17 DIAGNOSIS — N185 Chronic kidney disease, stage 5: Secondary | ICD-10-CM

## 2019-10-17 DIAGNOSIS — I120 Hypertensive chronic kidney disease with stage 5 chronic kidney disease or end stage renal disease: Secondary | ICD-10-CM

## 2019-10-17 DIAGNOSIS — I10 Essential (primary) hypertension: Secondary | ICD-10-CM

## 2019-10-17 MED ORDER — LOSARTAN POTASSIUM 50 MG PO TABS: 50.0000 mg | ORAL_TABLET | Freq: Every day | ORAL | 0 refills | Status: DC

## 2019-10-17 NOTE — Progress Notes (Signed)
Losartan refilled to cover until next follow up appointment.

## 2019-10-17 NOTE — Patient Instructions (Signed)
[name] , Thank you for taking time to come for your Medicare Wellness Visit. I appreciate your ongoing commitment to your health goals. Please review the following plan we discussed and let me know if I can assist you in the future.   Screening recommendations/referrals: Colonoscopy: up to date  Mammogram: declined  Bone Density: declined  Recommended yearly ophthalmology/optometry visit for glaucoma screening and checkup Recommended yearly dental visit for hygiene and checkup  Vaccinations: Influenza vaccine: declined  Pneumococcal vaccine: due now  Tdap vaccine: due now  Shingles vaccine: shingrix eligible    Covid-19: first dose completed   Advanced directives: Advance directive discussed with you today. Once this is complete please bring a copy in to our office so we can scan it into your chart.  Conditions/risks identified: none   Next appointment: Follow up in one year for your annual wellness visit    Preventive Care 65 Years and Older, Female Preventive care refers to lifestyle choices and visits with your health care provider that can promote health and wellness. What does preventive care include?  A yearly physical exam. This is also called an annual well check.  Dental exams once or twice a year.  Routine eye exams. Ask your health care provider how often you should have your eyes checked.  Personal lifestyle choices, including:  Daily care of your teeth and gums.  Regular physical activity.  Eating a healthy diet.  Avoiding tobacco and drug use.  Limiting alcohol use.  Practicing safe sex.  Taking low-dose aspirin every day.  Taking vitamin and mineral supplements as recommended by your health care provider. What happens during an annual well check? The services and screenings done by your health care provider during your annual well check will depend on your age, overall health, lifestyle risk factors, and family history of disease. Counseling    Your health care provider may ask you questions about your:  Alcohol use.  Tobacco use.  Drug use.  Emotional well-being.  Home and relationship well-being.  Sexual activity.  Eating habits.  History of falls.  Memory and ability to understand (cognition).  Work and work Statistician.  Reproductive health. Screening  You may have the following tests or measurements:  Height, weight, and BMI.  Blood pressure.  Lipid and cholesterol levels. These may be checked every 5 years, or more frequently if you are over 89 years old.  Skin check.  Lung cancer screening. You may have this screening every year starting at age 37 if you have a 30-pack-year history of smoking and currently smoke or have quit within the past 15 years.  Fecal occult blood test (FOBT) of the stool. You may have this test every year starting at age 57.  Flexible sigmoidoscopy or colonoscopy. You may have a sigmoidoscopy every 5 years or a colonoscopy every 10 years starting at age 61.  Hepatitis C blood test.  Hepatitis B blood test.  Sexually transmitted disease (STD) testing.  Diabetes screening. This is done by checking your blood sugar (glucose) after you have not eaten for a while (fasting). You may have this done every 1-3 years.  Bone density scan. This is done to screen for osteoporosis. You may have this done starting at age 57.  Mammogram. This may be done every 1-2 years. Talk to your health care provider about how often you should have regular mammograms. Talk with your health care provider about your test results, treatment options, and if necessary, the need for more tests. Vaccines  Your health care provider may recommend certain vaccines, such as:  Influenza vaccine. This is recommended every year.  Tetanus, diphtheria, and acellular pertussis (Tdap, Td) vaccine. You may need a Td booster every 10 years.  Zoster vaccine. You may need this after age 17.  Pneumococcal 13-valent  conjugate (PCV13) vaccine. One dose is recommended after age 14.  Pneumococcal polysaccharide (PPSV23) vaccine. One dose is recommended after age 55. Talk to your health care provider about which screenings and vaccines you need and how often you need them. This information is not intended to replace advice given to you by your health care provider. Make sure you discuss any questions you have with your health care provider. Document Released: 08/30/2015 Document Revised: 04/22/2016 Document Reviewed: 06/04/2015 Elsevier Interactive Patient Education  2017 [place] Prevention in the Home Falls can cause injuries. They can happen to people of all ages. There are many things you can do to make your home safe and to help prevent falls. What can I do on the outside of my home?  Regularly fix the edges of walkways and driveways and fix any cracks.  Remove anything that might make you trip as you walk through a door, such as a raised step or threshold.  Trim any bushes or trees on the path to your home.  Use bright outdoor lighting.  Clear any walking paths of anything that might make someone trip, such as rocks or tools.  Regularly check to see if handrails are loose or broken. Make sure that both sides of any steps have handrails.  Any raised decks and porches should have guardrails on the edges.  Have any leaves, snow, or ice cleared regularly.  Use sand or salt on walking paths during winter.  Clean up any spills in your garage right away. This includes oil or grease spills. What can I do in the bathroom?  Use night lights.  Install grab bars by the toilet and in the tub and shower. Do not use towel bars as grab bars.  Use non-skid mats or decals in the tub or shower.  If you need to sit down in the shower, use a plastic, non-slip stool.  Keep the floor dry. Clean up any water that spills on the floor as soon as it happens.  Remove soap buildup in the tub or  shower regularly.  Attach bath mats securely with double-sided non-slip rug tape.  Do not have throw rugs and other things on the floor that can make you trip. What can I do in the bedroom?  Use night lights.  Make sure that you have a light by your bed that is easy to reach.  Do not use any sheets or blankets that are too big for your bed. They should not hang down onto the floor.  Have a firm chair that has side arms. You can use this for support while you get dressed.  Do not have throw rugs and other things on the floor that can make you trip. What can I do in the kitchen?  Clean up any spills right away.  Avoid walking on wet floors.  Keep items that you use a lot in easy-to-reach places.  If you need to reach something above you, use a strong step stool that has a grab bar.  Keep electrical cords out of the way.  Do not use floor polish or wax that makes floors slippery. If you must use wax, use non-skid floor wax.  Do  not have throw rugs and other things on the floor that can make you trip. What can I do with my stairs?  Do not leave any items on the stairs.  Make sure that there are handrails on both sides of the stairs and use them. Fix handrails that are broken or loose. Make sure that handrails are as long as the stairways.  Check any carpeting to make sure that it is firmly attached to the stairs. Fix any carpet that is loose or worn.  Avoid having throw rugs at the top or bottom of the stairs. If you do have throw rugs, attach them to the floor with carpet tape.  Make sure that you have a light switch at the top of the stairs and the bottom of the stairs. If you do not have them, ask someone to add them for you. What else can I do to help prevent falls?  Wear shoes that:  Do not have high heels.  Have rubber bottoms.  Are comfortable and fit you well.  Are closed at the toe. Do not wear sandals.  If you use a stepladder:  Make sure that it is fully  opened. Do not climb a closed stepladder.  Make sure that both sides of the stepladder are locked into place.  Ask someone to hold it for you, if possible.  Clearly mark and make sure that you can see:  Any grab bars or handrails.  First and last steps.  Where the edge of each step is.  Use tools that help you move around (mobility aids) if they are needed. These include:  Canes.  Walkers.  Scooters.  Crutches.  Turn on the lights when you go into a dark area. Replace any light bulbs as soon as they burn out.  Set up your furniture so you have a clear path. Avoid moving your furniture around.  If any of your floors are uneven, fix them.  If there are any pets around you, be aware of where they are.  Review your medicines with your doctor. Some medicines can make you feel dizzy. This can increase your chance of falling. Ask your doctor what other things that you can do to help prevent falls. This information is not intended to replace advice given to you by your health care provider. Make sure you discuss any questions you have with your health care provider. Document Released: 05/30/2009 Document Revised: 01/09/2016 Document Reviewed: 09/07/2014 Elsevier Interactive Patient Education  2017 [employer].

## 2019-10-17 NOTE — Progress Notes (Signed)
Subjective:   Destiny Adams is a 69 y.o. female who presents for Medicare Annual (Subsequent) preventive examination.  This visit is being conducted via phone call  - after an attmept to do on video chat - due to the COVID-19 pandemic. This patient has given me verbal consent via phone to conduct this visit, patient states they are participating from their home address. Some vital signs may be absent or patient reported.   Patient identification: identified by name, DOB, and current address.    Review of Systems:   Cardiac Risk Factors include: advanced age (>35men, >53 women);dyslipidemia;hypertension     Objective:     Vitals: Ht 5\' 1"  (1.549 m)   Wt 200 lb (90.7 kg)   BMI 37.79 kg/m   Body mass index is 37.79 kg/m.  Advanced Directives 10/17/2019 03/16/2018 12/14/2017 07/29/2016  Does Patient Have a Medical Advance Directive? No No No No  Would patient like information on creating a medical advance directive? - No - Patient declined Yes (MAU/Ambulatory/Procedural Areas - Information given) No - Patient declined    Tobacco Social History   Tobacco Use  Smoking Status Never Smoker  Smokeless Tobacco Never Used     Counseling given: Not Answered   Clinical Intake:  Pre-visit preparation completed: Yes  Pain : No/denies pain     Nutritional Risks: None Diabetes: No  How often do you need to have someone help you when you read instructions, pamphlets, or other written materials from your doctor or pharmacy?: 1 - Never  Interpreter Needed?: No  Information entered by :: Emma Schupp,LPN  Past Medical History:  Diagnosis Date  . Anemia   . CKD (chronic kidney disease) stage 4, GFR 15-29 ml/min (HCC) 07/23/2017  . GERD (gastroesophageal reflux disease)   . Hyperlipidemia   . Hypertension   . Severe obesity (BMI 35.0-35.9 with comorbidity) ([place]) 07/23/2017   Comorbid CKD stage IV, Hypertension, Hyperlipidemia   Past Surgical History:  Procedure Laterality  Date  . COLONOSCOPY WITH PROPOFOL N/A 07/29/2016   Procedure: COLONOSCOPY WITH PROPOFOL;  Surgeon: Robert Bellow, MD;  Location[facility] ENDOSCOPY;  Service: Endoscopy;  Laterality: N/A;  . TUBAL LIGATION     Family History  Problem Relation Age of Onset  . Colon cancer Maternal Uncle   . Colon cancer Paternal Uncle   . Breast cancer Paternal Aunt        65  . Alzheimer's disease Mother   . Cancer Father    Social History   Socioeconomic History  . Marital status: Divorced    Spouse name: Not on file  . Number of children: Not on file  . Years of education: Not on file  . Highest education level: Not on file  Occupational History  . Occupation: retired  Tobacco Use  . Smoking status: Never Smoker  . Smokeless tobacco: Never Used  Substance and Sexual Activity  . Alcohol use: No  . Drug use: No  . Sexual activity: Not on file  Other Topics Concern  . Not on file  Social History Narrative  . Not on file   Social Determinants of Health   Financial Resource Strain:   . Difficulty of Paying Living Expenses: Not on file  Food Insecurity:   . Worried About Charity fundraiser in the Last Year: Not on file  . Ran Out of Food in the Last Year: Not on file  Transportation Needs:   . Lack of Transportation (Medical): Not on file  .  Lack of Transportation (Non-Medical): Not on file  Physical Activity:   . Days of Exercise per Week: Not on file  . Minutes of Exercise per Session: Not on file  Stress:   . Feeling of Stress : Not on file  Social Connections:   . Frequency of Communication with Friends and Family: Not on file  . Frequency of Social Gatherings with Friends and Family: Not on file  . Attends Religious Services: Not on file  . Active Member of Clubs or Organizations: Not on file  . Attends Archivist Meetings: Not on file  . Marital Status: Not on file    Outpatient Encounter Medications as of 10/17/2019  Medication Sig  . amLODipine (NORVASC) 10  MG tablet TAKE 1 TABLET EVERY DAY  . atorvastatin (LIPITOR) 10 MG tablet TAKE 2 TABLETS EVERY DAY  . calcitRIOL (ROCALTROL) 0.5 MCG capsule Take 0.5 mcg by mouth daily.  Marland Kitchen levocetirizine (XYZAL) 5 MG tablet Take 1 tablet (5 mg total) by mouth every Monday, Wednesday, and Friday. Dose changed due to renal function at last check.  . loratadine (CLARITIN) 10 MG tablet Take 10 mg by mouth daily.  . metoprolol succinate (TOPROL-XL) 100 MG 24 hr tablet TAKE 1 TABLET EVERY DAY  . omeprazole (PRILOSEC) 20 MG capsule TAKE 1 CAPSULE EVERY DAY  . losartan (COZAAR) 50 MG tablet Take 50 mg by mouth daily.  . SODIUM BICARBONATE PO Take 10 g by mouth.  . [DISCONTINUED] diclofenac sodium (VOLTAREN) 1 % GEL Apply 2 g topically 4 (four) times daily as needed (low back pain, arthritis). (Patient not taking: Reported on 10/17/2019)  . [DISCONTINUED] fluticasone (FLONASE) 50 MCG/ACT nasal spray USE 2 SPRAYS IN EACH NOSTRIL EVERY DAY (Patient not taking: Reported on 10/17/2019)   No facility-administered encounter medications on file as of 10/17/2019.    Activities of Daily Living In your present state of health, do you have any difficulty performing the following activities: 10/17/2019 07/19/2019  Hearing? N N  Comment no hearing aids -  Vision? N N  Comment Dr.bell, eyeglasses -  Difficulty concentrating or making decisions? N N  Walking or climbing stairs? N N  Dressing or bathing? N N  Doing errands, shopping? N N  Preparing Food and eating ? N -  Using the Toilet? N -  In the past six months, have you accidently leaked urine? N -  Do you have problems with loss of bowel control? N -  Managing your Medications? N -  Managing your Finances? N -  Housekeeping or managing your Housekeeping? N -  Some recent data might be hidden    Patient Care Team: Malfi, Lupita Raider, FNP as PCP - General (Family Medicine) Bary Castilla Forest Gleason, MD (General Surgery)    Assessment:   This is a routine wellness examination for  Destiny Adams.  Exercise Activities and Dietary recommendations Current Exercise Habits: Home exercise routine, Time (Minutes): 30, Frequency (Times/Week): 3, Weekly Exercise (Minutes/Week): 90, Intensity: Mild, Exercise limited by: None identified  Goals Addressed   None     Fall Risk: Fall Risk  10/17/2019 07/19/2019 12/14/2017 07/22/2017  Falls in the past year? 0 0 No No  Number falls in past yr: 0 0 - -  Injury with Fall? 0 - - -  Follow up - Falls evaluation completed - -    FALL RISK PREVENTION PERTAINING TO THE HOME:  Any stairs in or around the home? No  If so, are there any without handrails? No  Home free of loose throw rugs in walkways, pet beds, electrical cords, etc? Yes  Adequate lighting in your home to reduce risk of falls? Yes   ASSISTIVE DEVICES UTILIZED TO PREVENT FALLS:  Life alert? No  Use of a cane, walker or w/c? No  Grab bars in the bathroom? No  Shower chair or bench in shower? No  Elevated toilet seat or a handicapped toilet? No   DME ORDERS:  DME order needed?  No   TIMED UP AND GO:  Unable to perform    Depression Screen PHQ 2/9 Scores 10/17/2019 07/19/2019 04/20/2019 12/14/2017  PHQ - 2 Score 0 0 0 0  PHQ- 9 Score - - 0 4     Cognitive Function     6CIT Screen 12/14/2017  What Year? 0 points  What month? 0 points  What time? 0 points  Count back from 20 0 points  Months in reverse 0 points  Repeat phrase 0 points  Total Score 0    Immunization History  Administered Date(s) Administered  . Hepatitis A, Adult 04/26/2018  . Hepatitis B, adult 04/26/2018, 05/27/2018, 04/26/2019  . Moderna SARS-COVID-2 Vaccination 09/27/2019  . Pneumococcal Conjugate-13 12/14/2017    Qualifies for Shingles Vaccine? Yes  Zostavax completed n/a. Due for Shingrix. Education has been provided regarding the importance of this vaccine. Pt has been advised to call insurance company to determine out of pocket expense. Advised may also receive vaccine at local  pharmacy or Health Dept. Verbalized acceptance and understanding.  Tdap: Discussed need for TD/TDAP vaccine, patient verbalized understanding that this is not covered as a preventative with there insurance and to call the office if she gets develops any new skin injuries, ie: cuts, scrapes, bug bites, or open wounds.  Flu Vaccine: declined   Pneumococcal Vaccine: due now    Covid-19 Vaccine:  Completed first dose, second dose 10/18/2019  Screening Tests Health Maintenance  Topic Date Due  . PNA vac Low Risk Adult (2 of 2 - PPSV23) 12/15/2018  . INFLUENZA VACCINE  11/15/2019 (Originally 03/18/2019)  . MAMMOGRAM  10/16/2020 (Originally 11/23/1999)  . DEXA SCAN  10/16/2020 (Originally 11/23/2014)  . TETANUS/TDAP  10/16/2020 (Originally 11/22/1968)  . COLONOSCOPY  07/29/2026  . Hepatitis C Screening  Completed    Cancer Screenings:  Colorectal Screening: Completed 2017. Repeat every 10 years  Mammogram: declined  Bone Density: declined  Lung Cancer Screening: (Low Dose CT Chest recommended if Age 46-80 years, 30 pack-year currently smoking OR have quit w/in 15years.) does not qualify.   Additional Screening:  Hepatitis C Screening: does qualify; Completed 2019  Vision Screening: Recommended annual ophthalmology exams for early detection of glaucoma and other disorders of the eye. Is the patient up to date with their annual eye exam?  Yes  Who is the provider or what is the name of the office in which the pt attends annual eye exams? Dr.bell   Dental Screening: Recommended annual dental exams for proper oral hygiene  Community Resource Referral:  CRR required this visit?  No       Plan:  I have personally reviewed and addressed the Medicare Annual Wellness questionnaire and have noted the following in the patient's chart:  A. Medical and social history B. Use of alcohol, tobacco or illicit drugs  C. Current medications and supplements D. Functional ability and status E.    Nutritional status F.  Physical activity G. Advance directives H. List of other physicians I.  Hospitalizations, surgeries, and ER visits in  previous 12 months J.  [place] such as hearing and vision if needed, cognitive and depression L. Referrals and appointments   In addition, I have reviewed and discussed with patient certain preventive protocols, quality metrics, and best practice recommendations. A written personalized care plan for preventive services as well as general preventive health recommendations were provided to patient.  Signed,    Bevelyn Ngo, LPN  [id number] Nurse Health Advisor   Nurse Notes: needs refill on losartan, is completely out. [facility]. appt is scheduled for 11/29/2019

## 2019-10-18 DIAGNOSIS — Z23 Encounter for immunization: Secondary | ICD-10-CM | POA: Diagnosis not present

## 2019-10-31 ENCOUNTER — Ambulatory Visit: Payer: Medicare HMO | Admitting: Family Medicine

## 2019-11-06 ENCOUNTER — Other Ambulatory Visit: Payer: Self-pay | Admitting: Nurse Practitioner

## 2019-11-06 DIAGNOSIS — E782 Mixed hyperlipidemia: Secondary | ICD-10-CM

## 2019-11-08 DIAGNOSIS — I10 Essential (primary) hypertension: Secondary | ICD-10-CM | POA: Insufficient documentation

## 2019-11-08 DIAGNOSIS — R809 Proteinuria, unspecified: Secondary | ICD-10-CM | POA: Insufficient documentation

## 2019-11-08 DIAGNOSIS — N184 Chronic kidney disease, stage 4 (severe): Secondary | ICD-10-CM | POA: Insufficient documentation

## 2019-11-29 ENCOUNTER — Ambulatory Visit: Payer: Medicare HMO | Admitting: Family Medicine

## 2019-12-01 ENCOUNTER — Other Ambulatory Visit: Payer: Self-pay | Admitting: Family Medicine

## 2019-12-01 DIAGNOSIS — I10 Essential (primary) hypertension: Secondary | ICD-10-CM

## 2019-12-01 NOTE — Telephone Encounter (Signed)
Requested Prescriptions  Pending Prescriptions Disp Refills  . amLODipine (NORVASC) 10 MG tablet [Pharmacy Med Name: AMLODIPINE BESYLATE 10 MG Tablet] 90 tablet 0    Sig: TAKE 1 TABLET EVERY DAY     Cardiovascular:  Calcium Channel Blockers Failed - 12/01/2019  2:01 PM      Failed - Last BP in normal range    BP Readings from Last 1 Encounters:  04/20/19 (!) 148/93         Passed - Valid encounter within last 6 months    Recent Outpatient Visits          4 months ago CKD stage 5 secondary to hypertension [facility])   [place], DO   7 months ago Essential hypertension   [facility] Mikey College, NP   1 year ago Gastroesophageal reflux disease, esophagitis presence not specified   [facility] Mikey College, NP   1 year ago Essential hypertension   [facility] Mikey College, NP   1 year ago Essential hypertension   [facility] Merrilyn Puma, Jerrel Ivory, NP      Future Appointments            In 1 week Malfi, Lupita Raider, [facility], [facility]

## 2019-12-13 ENCOUNTER — Other Ambulatory Visit: Payer: Self-pay

## 2019-12-13 ENCOUNTER — Encounter: Payer: Self-pay | Admitting: Family Medicine

## 2019-12-13 ENCOUNTER — Telehealth: Payer: Self-pay | Admitting: Family Medicine

## 2019-12-13 ENCOUNTER — Ambulatory Visit (INDEPENDENT_AMBULATORY_CARE_PROVIDER_SITE_OTHER): Payer: Medicare HMO | Admitting: Family Medicine

## 2019-12-13 ENCOUNTER — Telehealth: Payer: Self-pay

## 2019-12-13 DIAGNOSIS — R110 Nausea: Secondary | ICD-10-CM | POA: Diagnosis not present

## 2019-12-13 DIAGNOSIS — I120 Hypertensive chronic kidney disease with stage 5 chronic kidney disease or end stage renal disease: Secondary | ICD-10-CM

## 2019-12-13 NOTE — Progress Notes (Signed)
Virtual Visit via Telephone  The purpose of this virtual visit is to provide medical care while limiting exposure to the novel coronavirus (COVID19) for both patient and office staff.  Consent was obtained for phone visit:  Yes.   Answered questions that patient had about telehealth interaction:  Yes.   I discussed the limitations, risks, security and privacy concerns of performing an evaluation and management service by telephone. I also discussed with the patient that there may be a patient responsible charge related to this service. The patient expressed understanding and agreed to proceed.  Patient is at home and is accessed via telephone Services are provided by Harlin Rain, FNP-C from [facility])  ---------------------------------------------------------------------- Chief Complaint  Patient presents with  . Emesis    vomiting lastnight & once this morning, nauseated x 1day     S: Reviewed CMA documentation. I have called patient and gathered additional HPI as follows:  Destiny Adams presents for telemedicine visit with concerns of vomiting and feeling unwell since last evening and nauseated all day today.  Reports that she believes it is related to eating oysters, as she had gotten sick after eating them 1 year ago.  Reviewed any changes in the past few months to her medications or medical history.  Reports had dental surgery approx 2 weeks ago and was put on an antibiotic and medication for the discomfort.  Discussed importance of avoiding NSAIDs with her CKD and the importance of following up for lab work and re-evaluation.  Discussed concerns for an acute kidney injury and the importance of having labs done today for evaluation as STAT from our clinic but would best be seen in the emergency department for fluids and immediate lab work.   Patient is currently home Denies any high risk travel to areas of current concern for COVID19. Denies any known or  suspected exposure to person with or possibly with COVID19.  Denies any fevers, chills, sweats, body ache, cough, shortness of breath, sinus pain or pressure, headache, abdominal pain, diarrhea  Past Medical History:  Diagnosis Date  . Anemia   . CKD (chronic kidney disease) stage 4, GFR 15-29 ml/min (HCC) 07/23/2017  . GERD (gastroesophageal reflux disease)   . Hyperlipidemia   . Hypertension   . Severe obesity (BMI 35.0-35.9 with comorbidity) ([place]) 07/23/2017   Comorbid CKD stage IV, Hypertension, Hyperlipidemia   Social History   Tobacco Use  . Smoking status: Never Smoker  . Smokeless tobacco: Never Used  Substance Use Topics  . Alcohol use: No  . Drug use: No    Current Outpatient Medications:  .  acetaminophen (TYLENOL 8 HOUR) 650 MG CR tablet, Take 650 mg by mouth every 8 (eight) hours as needed for pain., Disp: , Rfl:  .  amLODipine (NORVASC) 10 MG tablet, TAKE 1 TABLET EVERY DAY, Disp: 90 tablet, Rfl: 0 .  atorvastatin (LIPITOR) 10 MG tablet, TAKE 2 TABLETS EVERY DAY, Disp: 180 tablet, Rfl: 0 .  calcitRIOL (ROCALTROL) 0.5 MCG capsule, Take 0.5 mcg by mouth daily., Disp: , Rfl:  .  levocetirizine (XYZAL) 5 MG tablet, Take 1 tablet (5 mg total) by mouth every Monday, Wednesday, and Friday. Dose changed due to renal function at last check., Disp: 60 tablet, Rfl: 1 .  loratadine (CLARITIN) 10 MG tablet, Take 10 mg by mouth daily., Disp: , Rfl:  .  losartan (COZAAR) 50 MG tablet, Take 1 tablet (50 mg total) by mouth daily., Disp: 90 tablet, Rfl: 0 .  metoprolol succinate (TOPROL-XL) 100 MG 24 hr tablet, TAKE 1 TABLET EVERY DAY, Disp: 90 tablet, Rfl: 1 .  omeprazole (PRILOSEC) 20 MG capsule, TAKE 1 CAPSULE EVERY DAY, Disp: 90 capsule, Rfl: 1 .  Glecaprevir-Pibrentasvir (MAVYRET) 100-40 MG TABS, Take by mouth., Disp: , Rfl:  .  ibuprofen (ADVIL) 800 MG tablet, , Disp: , Rfl:  .  SODIUM BICARBONATE PO, Take 10 g by mouth., Disp: , Rfl:   Depression screen [facility] 2/9 10/17/2019  07/19/2019 04/20/2019  Decreased Interest 0 0 0  Down, Depressed, Hopeless 0 0 0  PHQ - 2 Score 0 0 0  Altered sleeping - - 0  Tired, decreased energy - - 0  Change in appetite - - 0  Feeling bad or failure about yourself  - - 0  Trouble concentrating - - 0  Moving slowly or fidgety/restless - - 0  Suicidal thoughts - - 0  PHQ-9 Score - - 0  Difficult doing work/chores - - -    No flowsheet data found.  -------------------------------------------------------------------------- O: No physical exam performed due to remote telephone encounter.  Physical Exam: Patient remotely monitored without video.  Verbal communication appropriate.  Cognition normal.  No results found for this or any previous visit (from the past 2160 hour(s)).  -------------------------------------------------------------------------- A&P:  Problem List Items Addressed This Visit      Other   Nausea - Primary    New onset x 2 days.  Discussed with patient concerns of possible AKI and/or worsening kidney function leading to these symptoms.  Educated patient on importance of having STAT labs done in our office now or an outpatient lab, but would be better suited going to the emergency room for immediate work up, fluids and check on her kidney function.  Last labs for GFR were 04/20/2019 and were 11/13.  Patient declines and states she just wants to go home and go to sleep.  Let patient know that she could sleep in the emergency department while they work on her labs/tests/fluids/evaluation.  Patient declined.  Educated patient that not going to the emergency department and having emergent evaluation could lead to serious complications such as furthering kidney damage, dehydration, kidney failure, up to and including death.  Patient verbalized understanding and denied going to ER for evaluation.  Telephone call to [place] at [facility] for medication review shows patient recently filled prescriptions for  Amoxicillin 875mg  1 tablet BID and Ibuprofen 800mg  Q6H.  Telephone call back to patient to encourage follow up with the emergency department, received voicemail, left message.          No orders of the defined types were placed in this encounter.   Follow-up: - Follow up after ER evaluation  Patient verbalizes understanding with the above medical recommendations including the limitation of remote medical advice.  Specific follow-up and call-back criteria were given for patient to follow-up or seek medical care more urgently if needed.   - Time spent in direct consultation with patient on phone: 11 minutes  Harlin Rain, [facility] 12/13/2019, 10:45 AM

## 2019-12-13 NOTE — Assessment & Plan Note (Addendum)
New onset x 2 days.  Discussed with patient concerns of possible AKI and/or worsening kidney function leading to these symptoms.  Educated patient on importance of having STAT labs done in our office now or an outpatient lab, but would be better suited going to the emergency room for immediate work up, fluids and check on her kidney function.  Last labs for GFR were 04/20/2019 and were 11/13.  Patient declines and states she just wants to go home and go to sleep.  Let patient know that she could sleep in the emergency department while they work on her labs/tests/fluids/evaluation.  Patient declined.  Educated patient that not going to the emergency department and having emergent evaluation could lead to serious complications such as furthering kidney damage, dehydration, kidney failure, up to and including death.  Patient verbalized understanding and denied going to ER for evaluation.  Telephone call to [place] at [facility] for medication review shows patient recently filled prescriptions for Amoxicillin 875mg  1 tablet BID and Ibuprofen 800mg  Q6H.  Telephone call back to patient to encourage follow up with the emergency department, received voicemail, left message.

## 2019-12-13 NOTE — Telephone Encounter (Signed)
I attempted to contact the patient as well and her phone is going straight to vm.  I reached out to her emergency contact persons Peggy Hightower(sister, No answer). Then I called Bonner Puna (sister) and informed her to have her sister give [place] a call.

## 2019-12-13 NOTE — Telephone Encounter (Signed)
-  CMA call to [facility] at [place] to find out recently prescribed medications, as patient unaware of names of Rx.  Received update patient was prescribed amoxicillin 875mg  1 tablet BID and Ibuprofen 800mg  every 6 hours within the past 2 weeks.  -Call back to Ms. Keagle to continue to urge to proceed to the emergency room for evaluation, received voicemail, left message.  -Call to Dr. Inocente Salles Lateef's office (Nephrology) today at 10:06am, spoke with Glenard Haring and provided information regarding my telephone visit with Destiny Adams, the concern for AKI and needing urgent ER evaluation.  Glenard Haring reported would give the information to Dr. Holley Raring and would try to contact patient as well this afternoon.

## 2019-12-13 NOTE — Telephone Encounter (Signed)
Left message for patient, requested call back to the office to see how she is feeling and re-encourage her to proceed to the ER.

## 2019-12-13 NOTE — Patient Instructions (Signed)
As discussed, should proceed to the emergency room immediately for evaluation of kidney function and treatment/testing.  With having chronic kidney disease, it is VERY important that you keep your scheduled appointments with your nephrologist and that you avoid all NSAIDs.  STOP taking the ibuprofen from your dentist and avoid all over the counter NSAIDs, such as ibuprofen, advil, aleve, motrin, goody's and BC powder.  We will see you back after your ER evaluation and treatment.

## 2019-12-14 ENCOUNTER — Other Ambulatory Visit: Payer: Self-pay | Admitting: Nurse Practitioner

## 2019-12-14 DIAGNOSIS — J301 Allergic rhinitis due to pollen: Secondary | ICD-10-CM

## 2019-12-14 NOTE — Telephone Encounter (Signed)
Requested Prescriptions  Pending Prescriptions Disp Refills  . levocetirizine (XYZAL) 5 MG tablet [Pharmacy Med Name: LEVOCETIRIZINE DIHYDROCHLORIDE 5 MG Tablet] 39 tablet     Sig: TAKE 1 TABLET DAILY ON MONDAY, [place]  (DOSE CHANGED DUE TO RENAL FUNCTION AT LAST CHECK)     Ear, Nose, and Throat:  Antihistamines Passed - 12/14/2019  1:33 PM      Passed - Valid encounter within last 12 months    Recent Outpatient Visits          Yesterday Nausea   [facility], Lupita Raider, FNP   4 months ago CKD stage 5 secondary to hypertension [facility])   [place], DO   7 months ago Essential hypertension   [facility] Mikey College, NP   1 year ago Gastroesophageal reflux disease, esophagitis presence not specified   [facility] Mikey College, NP   1 year ago Essential hypertension   [facility] Merrilyn Puma, Jerrel Ivory, NP

## 2019-12-15 ENCOUNTER — Telehealth: Payer: Self-pay | Admitting: Family Medicine

## 2019-12-15 NOTE — Chronic Care Management (AMB) (Signed)
  Chronic Care Management   Note  12/15/2019 Name: GLENA PHARRIS MRN: [id number] DOB: 1950/10/28  Rosario Jacks is a 69 y.o. year old female who is a primary care patient of Verl Bangs, FNP. I reached out to Rosario Jacks by phone today in response to a referral sent by Ms. Renella Cunas Debella's PCP, Cyndia Skeeters FNP     Ms. Hoshino was given information about Chronic Care Management services today including:  1. CCM service includes personalized support from designated clinical staff supervised by her physician, including individualized plan of care and coordination with other care providers 2. 24/7 contact phone numbers for assistance for urgent and routine care needs. 3. Service will only be billed when office clinical staff spend 20 minutes or more in a month to coordinate care. 4. Only one practitioner may furnish and bill the service in a calendar month. 5. The patient may stop CCM services at any time (effective at the end of the month) by phone call to the office staff. 6. The patient will be responsible for cost sharing (co-pay) of up to 20% of the service fee (after annual deductible is met).  Patient did not agree to enrollment in care management services and does not wish to consider at this time.  Follow up plan: The care management team is available to follow up with the patient after provider conversation with the patient regarding recommendation for care management engagement and subsequent re-referral to the care management team.   Glenna Durand, LPN Health Advisor, [place] [facility] ??[email or web address] ??[phone]

## 2019-12-15 NOTE — Telephone Encounter (Signed)
Open in error

## 2019-12-18 ENCOUNTER — Other Ambulatory Visit: Payer: Self-pay | Admitting: Family Medicine

## 2019-12-18 DIAGNOSIS — I10 Essential (primary) hypertension: Secondary | ICD-10-CM

## 2019-12-18 DIAGNOSIS — I120 Hypertensive chronic kidney disease with stage 5 chronic kidney disease or end stage renal disease: Secondary | ICD-10-CM

## 2019-12-18 DIAGNOSIS — N185 Chronic kidney disease, stage 5: Secondary | ICD-10-CM

## 2019-12-18 MED ORDER — LOSARTAN POTASSIUM 50 MG PO TABS: 50.0000 mg | ORAL_TABLET | Freq: Every day | ORAL | 0 refills | Status: DC

## 2019-12-18 NOTE — Telephone Encounter (Signed)
Pt called in to request a refill for losartan (COZAAR) 50 MG tablet   Pharmacy:  [place], [place] Phone:  [phone]  Fax:  [phone]

## 2019-12-18 NOTE — Telephone Encounter (Signed)
Requested Prescriptions  Pending Prescriptions Disp Refills  . losartan (COZAAR) 50 MG tablet 90 tablet 0    Sig: Take 1 tablet (50 mg total) by mouth daily.     Cardiovascular:  Angiotensin Receptor Blockers Failed - 12/18/2019 11:31 AM      Failed - Cr in normal range and within 180 days    Creat  Date Value Ref Range Status  04/20/2019 3.85 (H) 0.50 - 0.99 mg/dL Final    Comment:    For patients >70 years of age, the reference limit for Creatinine is approximately 13% higher for people identified as African-American. .    Creatinine, Urine  Date Value Ref Range Status  03/16/2018 166 mg/dL Final         Failed - K in normal range and within 180 days    Potassium  Date Value Ref Range Status  04/20/2019 3.9 3.5 - 5.3 mmol/L Final         Failed - Last BP in normal range    BP Readings from Last 1 Encounters:  04/20/19 (!) 148/93         Passed - Patient is not pregnant      Passed - Valid encounter within last 6 months    Recent Outpatient Visits          5 days ago Nausea   [facility], Lupita Raider, FNP   5 months ago CKD stage 5 secondary to hypertension [facility])   Amasa, DO   8 months ago Essential hypertension   [facility] Mikey College, NP   1 year ago Gastroesophageal reflux disease, esophagitis presence not specified   [facility] Mikey College, NP   1 year ago Essential hypertension   [facility] Merrilyn Puma, Jerrel Ivory, NP

## 2019-12-28 ENCOUNTER — Ambulatory Visit: Payer: Self-pay | Admitting: Licensed Clinical Social Worker

## 2019-12-28 NOTE — Chronic Care Management (AMB) (Signed)
Visit Information  Ms. Sortor was given information about Care Management services today including:  1. Care Management services include personalized support from designated clinical staff supervised by her physician, including individualized plan of care and coordination with other care providers 2. 24/7 contact phone numbers for assistance for urgent and routine care needs. 3. The patient may stop CCM services at any time (effective at the end of the month) by phone call to the office staff.  Patient did not agree to services at this time but was very appreciative of the phone call, education and support provided today. Patient reports that she will let her PCP know if she changes her mind and wishes to gain CCM services.   The care management team is available to follow up with the patient after provider conversation with the patient regarding recommendation for care management engagement and subsequent re-referral to the care management team.   Eula Fried, BSW, MSW, [email or web address] Phone: [phone]

## 2020-01-03 ENCOUNTER — Other Ambulatory Visit: Payer: Self-pay | Admitting: Family Medicine

## 2020-01-03 DIAGNOSIS — K219 Gastro-esophageal reflux disease without esophagitis: Secondary | ICD-10-CM

## 2020-01-12 ENCOUNTER — Other Ambulatory Visit: Payer: Self-pay | Admitting: Family Medicine

## 2020-01-12 DIAGNOSIS — E782 Mixed hyperlipidemia: Secondary | ICD-10-CM

## 2020-02-05 ENCOUNTER — Other Ambulatory Visit: Payer: Self-pay | Admitting: Family Medicine

## 2020-02-05 DIAGNOSIS — I10 Essential (primary) hypertension: Secondary | ICD-10-CM

## 2020-02-20 ENCOUNTER — Other Ambulatory Visit: Payer: Self-pay | Admitting: Family Medicine

## 2020-02-20 DIAGNOSIS — I10 Essential (primary) hypertension: Secondary | ICD-10-CM

## 2020-02-20 DIAGNOSIS — N185 Chronic kidney disease, stage 5: Secondary | ICD-10-CM

## 2020-02-20 NOTE — Telephone Encounter (Signed)
Requested medications are due for refill today? Yes  Requested medications are on active medication list?  Yes  Last Refill:  12/18/2019  # 90 with no refills  Future visit scheduled? No   Notes to Clinic:  Medication failed RX refill protocol due to no labs within past 180 days.  Last labs performed on 05/17/2019.

## 2020-02-21 ENCOUNTER — Other Ambulatory Visit: Payer: Self-pay | Admitting: Family Medicine

## 2020-02-21 DIAGNOSIS — I10 Essential (primary) hypertension: Secondary | ICD-10-CM

## 2020-03-16 IMAGING — US US ABDOMEN COMPLETE W/ ELASTOGRAPHY
1 series · 12 of 25 positions shown · non-contrast
Comparison: Renal ultrasound 02/07/2018

CLINICAL DATA: Chronic hepatitis-C without hepatic coma.



[Series 1: us abdomen complete w/ elastography · 12 of 107 slices shown]
[im 5/107]
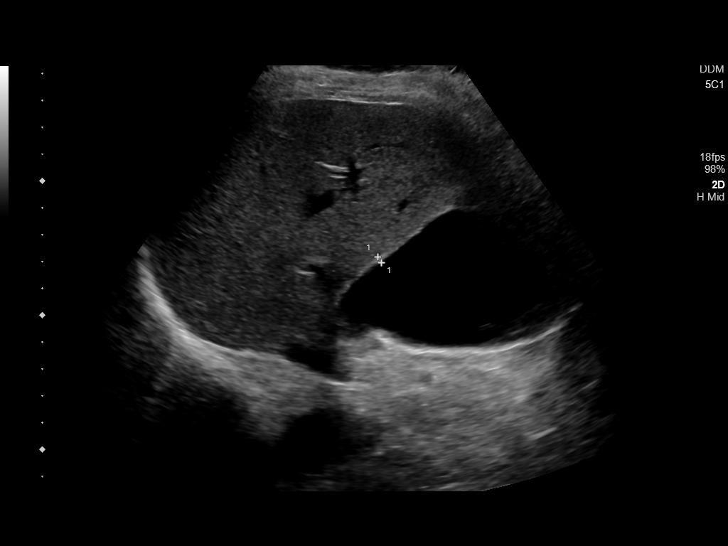
[im 14/107]
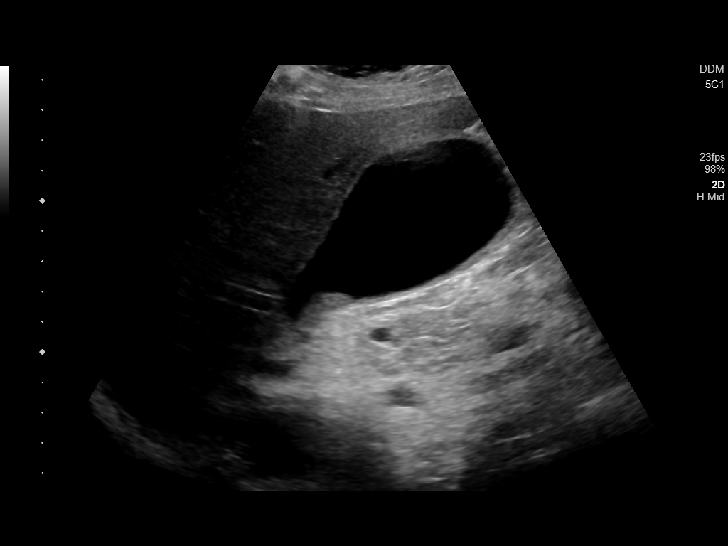
[im 23/107]
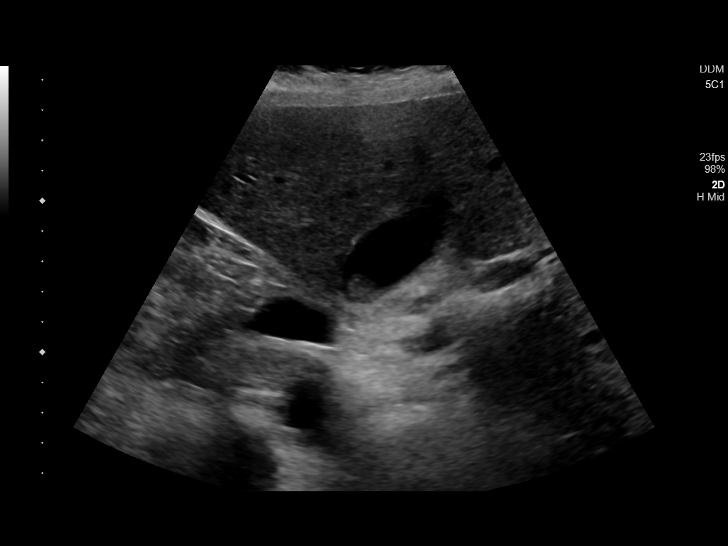
[im 31/107]
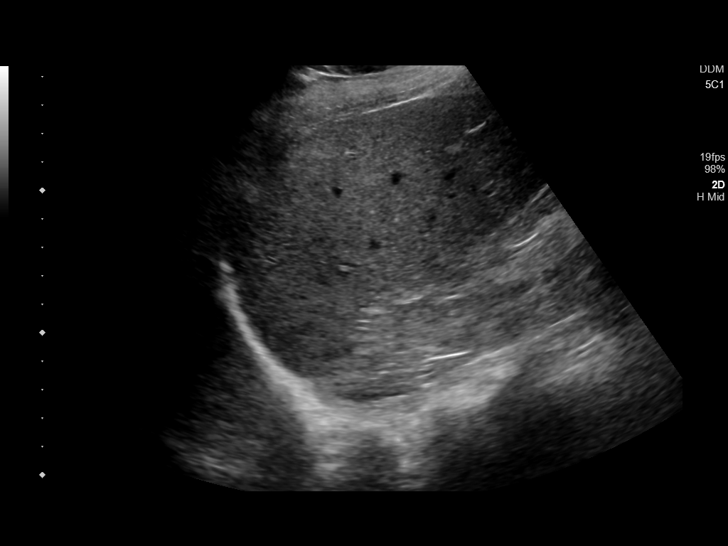
[im 40/107]
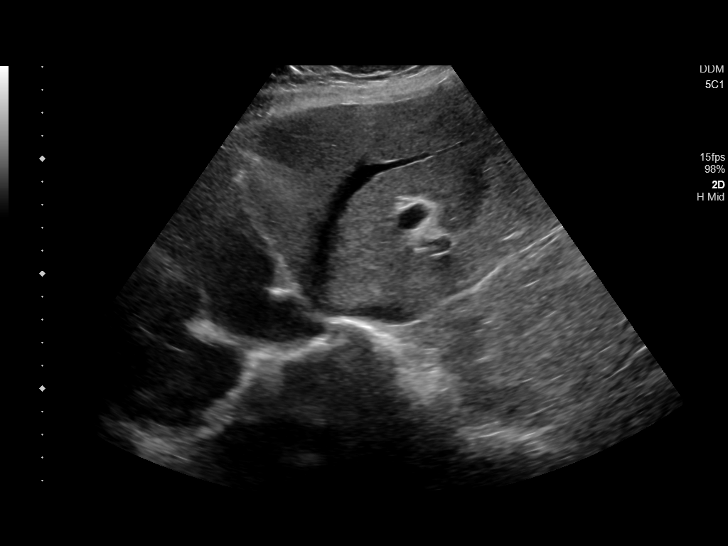
[im 49/107]
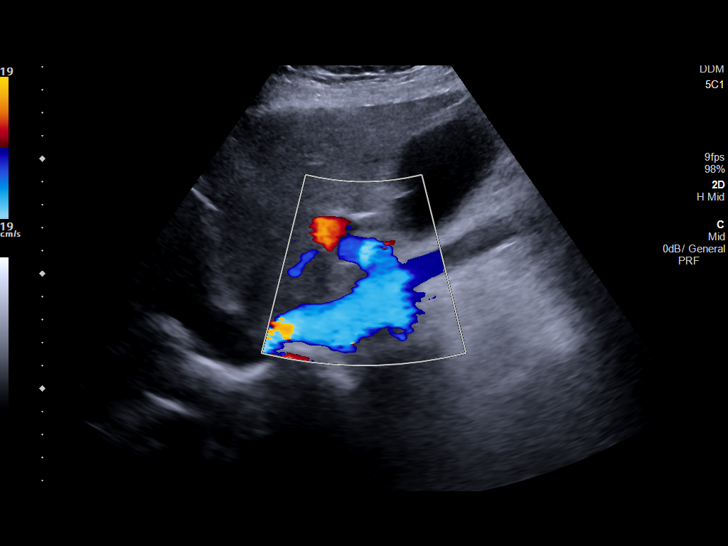
[im 58/107]
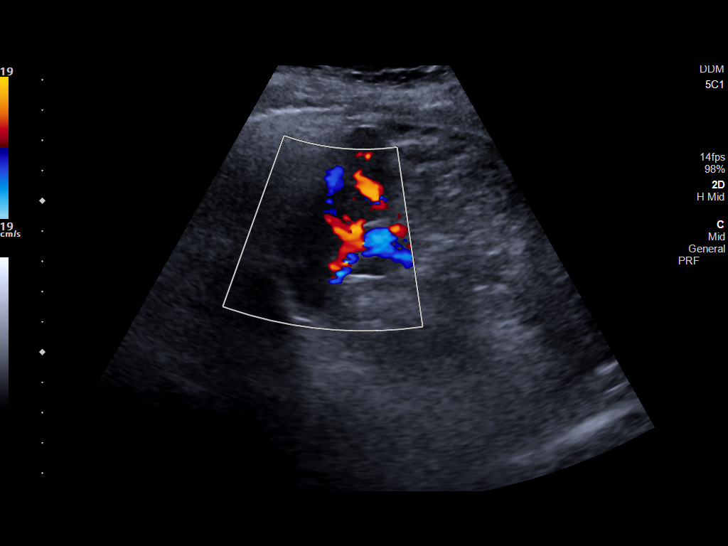
[im 67/107]
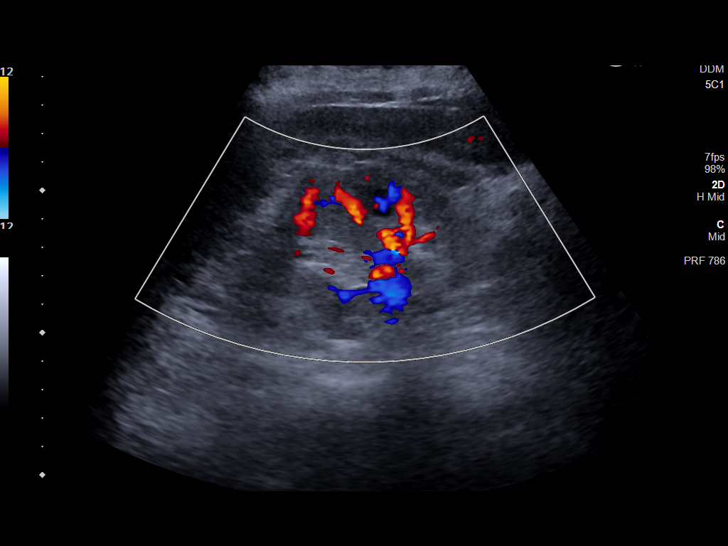
[im 76/107]
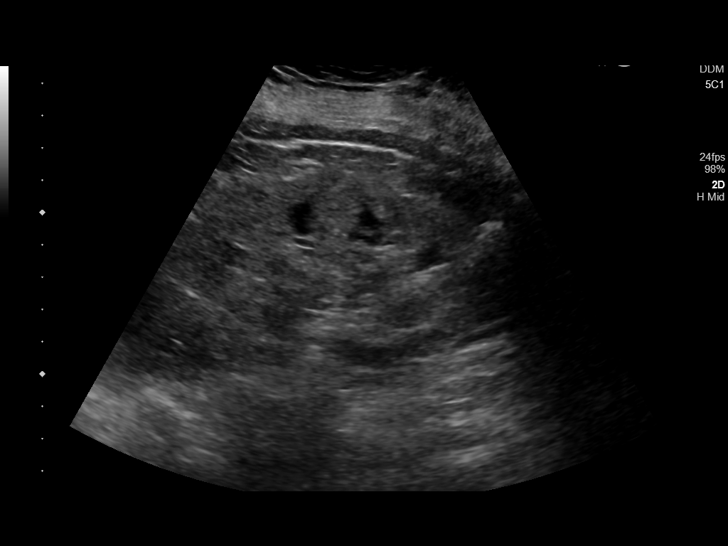
[im 84/107]
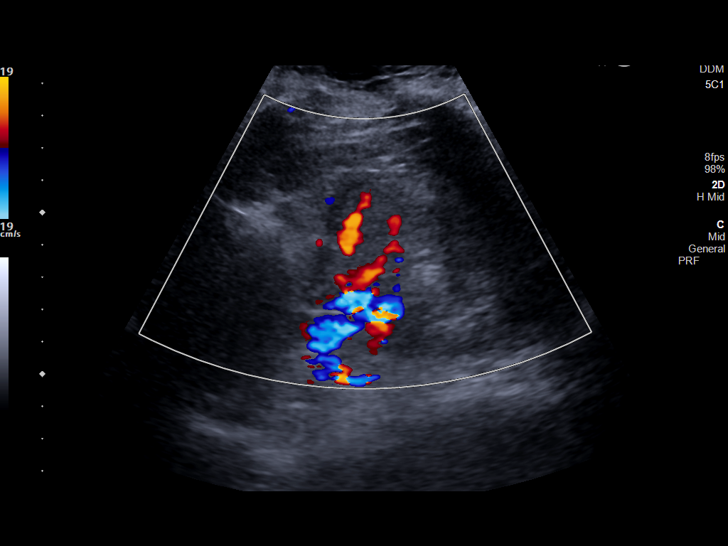
[im 93/107]
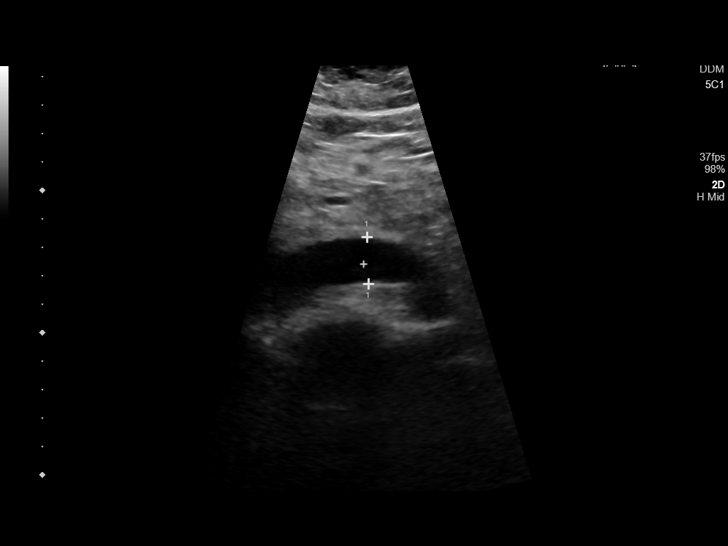
[im 102/107]
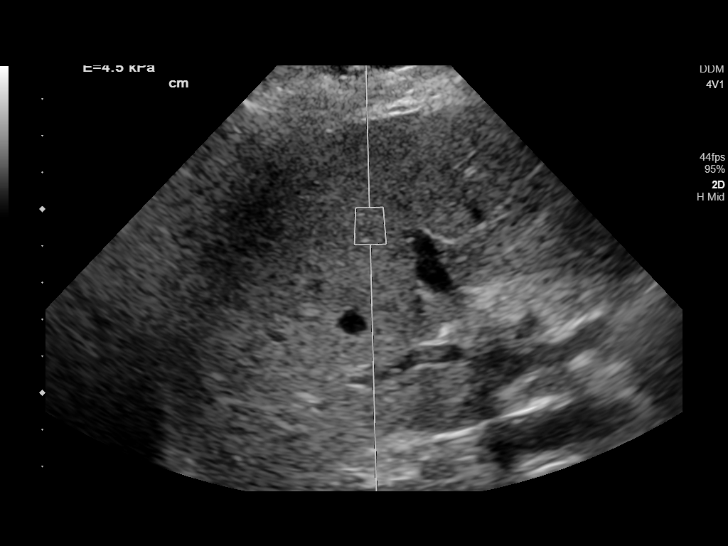

[12 of 25 positions shown; findings below may reference images not displayed]

FINDINGS: ULTRASOUND ABDOMEN

Gallbladder: Small amount of echogenic sludge noted, but no
gallstones or wall thickening visualized. No sonographic Murphy sign
noted by sonographer.

Common bile duct: Diameter: 6 mm, within normal limits.

Liver: No focal lesion identified. Within normal limits in
parenchymal echogenicity. Portal vein is patent on color Doppler
imaging with normal direction of blood flow towards the liver.

IVC: No abnormality visualized.

Pancreas: Visualized portion unremarkable.

Spleen: Size and appearance within normal limits.

Right Kidney: Length: 9.2 cm. Diffusely increased parenchymal
echogenicity again noted, consistent with medical renal disease.
cm simple cyst seen in midpole. No mass or hydronephrosis
visualized.

Left Kidney: Length: 9.8 cm. Diffusely increased renal parenchymal
echogenicity again noted, consistent with medical renal disease. No
mass or hydronephrosis visualized.

Abdominal aorta: No aneurysm visualized.

Other findings: None.

ULTRASOUND HEPATIC ELASTOGRAPHY

Device: Siemens Helix VTQ

Patient position: Supine

Transducer 4V1

Number of measurements: 10

Hepatic segment:  8

Median velocity:   1.06 m/sec

IQR:

IQR/Median velocity ratio:

Corresponding Metavir fibrosis score:  F0/F1

Risk of fibrosis: Minimal

Limitations of exam: None

Please note that abnormal shear wave velocities may also be
identified in clinical settings other than with hepatic fibrosis,
such as: acute hepatitis, elevated right heart and central venous
pressures including use of beta blockers, Weslien disease
(Pavittar), infiltrative processes such as
mastocytosis/amyloidosis/infiltrative tumor, extrahepatic
cholestasis, in the post-prandial state, and liver transplantation.
Correlation with patient history, laboratory data, and clinical
condition recommended.
IMPRESSION: ULTRASOUND ABDOMEN:

Unremarkable appearance of hepatic parenchyma. No liver mass
identified.

No evidence of cholelithiasis or biliary ductal dilatation.

Stable appearance of small kidneys with diffusely increased
echogenicity, consistent with medical renal disease. No evidence of
hydronephrosis.

ULTRASOUND HEPATIC ELASTOGRAPHY:

Median hepatic shear wave velocity is calculated at 1.06 m/sec.

Corresponding Metavir fibrosis score is F0/F1.

Risk of fibrosis is Minimal.

Follow-up: None required

## 2020-03-21 ENCOUNTER — Other Ambulatory Visit: Payer: Self-pay | Admitting: Family Medicine

## 2020-03-21 DIAGNOSIS — E782 Mixed hyperlipidemia: Secondary | ICD-10-CM

## 2020-04-19 ENCOUNTER — Other Ambulatory Visit: Payer: Self-pay | Admitting: Family Medicine

## 2020-04-19 DIAGNOSIS — I10 Essential (primary) hypertension: Secondary | ICD-10-CM

## 2020-04-19 NOTE — Telephone Encounter (Signed)
Requested Prescriptions  Pending Prescriptions Disp Refills   metoprolol succinate (TOPROL-XL) 100 MG 24 hr tablet [Pharmacy Med Name: METOPROLOL SUCCINATE ER 100 MG Tablet Extended Release 24 Hour] 90 tablet 0    Sig: TAKE 1 TABLET EVERY DAY     Cardiovascular:  Beta Blockers Failed - 04/19/2020  2:20 PM      Failed - Last BP in normal range    BP Readings from Last 1 Encounters:  04/20/19 (!) 148/93         Passed - Last Heart Rate in normal range    Pulse Readings from Last 1 Encounters:  04/20/19 76         Passed - Valid encounter within last 6 months    Recent Outpatient Visits          4 months ago Nausea   [place], Lupita Raider, FNP   9 months ago CKD stage 5 secondary to hypertension [facility])   [place], DO   1 year ago Essential hypertension   [facility] Mikey College, NP   1 year ago Gastroesophageal reflux disease, esophagitis presence not specified   [facility] Mikey College, NP   1 year ago Essential hypertension   [place], Jerrel Ivory, NP      Future Appointments            In 6 days [place], Lupita Raider, [facility], [facility]

## 2020-04-25 ENCOUNTER — Ambulatory Visit (INDEPENDENT_AMBULATORY_CARE_PROVIDER_SITE_OTHER): Payer: Medicare HMO | Admitting: Family Medicine

## 2020-04-25 ENCOUNTER — Telehealth: Payer: Self-pay

## 2020-04-25 ENCOUNTER — Encounter: Payer: Self-pay | Admitting: Family Medicine

## 2020-04-25 ENCOUNTER — Other Ambulatory Visit: Payer: Self-pay

## 2020-04-25 VITALS — BP 193/101 | HR 85 | Temp 98.1°F | Resp 18 | Ht 61.0 in | Wt 206.0 lb

## 2020-04-25 DIAGNOSIS — Z79899 Other long term (current) drug therapy: Secondary | ICD-10-CM

## 2020-04-25 DIAGNOSIS — I10 Essential (primary) hypertension: Secondary | ICD-10-CM | POA: Diagnosis not present

## 2020-04-25 DIAGNOSIS — N185 Chronic kidney disease, stage 5: Secondary | ICD-10-CM

## 2020-04-25 DIAGNOSIS — K219 Gastro-esophageal reflux disease without esophagitis: Secondary | ICD-10-CM

## 2020-04-25 DIAGNOSIS — I120 Hypertensive chronic kidney disease with stage 5 chronic kidney disease or end stage renal disease: Secondary | ICD-10-CM | POA: Diagnosis not present

## 2020-04-25 DIAGNOSIS — E782 Mixed hyperlipidemia: Secondary | ICD-10-CM

## 2020-04-25 DIAGNOSIS — Z23 Encounter for immunization: Secondary | ICD-10-CM | POA: Insufficient documentation

## 2020-04-25 DIAGNOSIS — R7309 Other abnormal glucose: Secondary | ICD-10-CM | POA: Diagnosis not present

## 2020-04-25 MED ORDER — LOSARTAN POTASSIUM 50 MG PO TABS: 50.0000 mg | ORAL_TABLET | Freq: Every day | ORAL | 0 refills | Status: DC

## 2020-04-25 MED ORDER — ATORVASTATIN CALCIUM 10 MG PO TABS: 20.0000 mg | ORAL_TABLET | Freq: Every day | ORAL | 1 refills | Status: DC

## 2020-04-25 MED ORDER — METOPROLOL SUCCINATE ER 100 MG PO TB24: 100.0000 mg | ORAL_TABLET | Freq: Every day | ORAL | 1 refills | Status: DC

## 2020-04-25 MED ORDER — LOSARTAN POTASSIUM 50 MG PO TABS: 50.0000 mg | ORAL_TABLET | Freq: Every day | ORAL | 1 refills | Status: DC

## 2020-04-25 MED ORDER — AMLODIPINE BESYLATE 10 MG PO TABS: 10.0000 mg | ORAL_TABLET | Freq: Every day | ORAL | 1 refills | Status: DC

## 2020-04-25 NOTE — Progress Notes (Signed)
Subjective:    Patient ID: Destiny Adams, female    DOB: 1950-10-22, 69 y.o.   MRN: [id number]  Destiny Adams is a 69 y.o. female presenting on 04/25/2020 for Hypertension   HPI   Ms. Bramble presents to clinic for follow up on her hypertension.  Reports has been out of her losartan for approximately 5 days.  Hypertension - She is not checking BP at home or outside of clinic.    - Current medications: amlodipine 4m daily, metoprolol succinate 104mdaily, and losartan 5049maily, tolerating well without side effects - She is not currently symptomatic. - Pt denies headache, lightheadedness, dizziness, changes in vision, chest tightness/pressure, palpitations, leg swelling, sudden loss of speech or loss of consciousness. - She  reports no regular exercise routine. - Her diet is high in salt, high in fat, and high in carbohydrates.    [facility] 10/17/2019 07/19/2019 04/20/2019  Decreased Interest 0 0 0  Down, Depressed, Hopeless 0 0 0  PHQ - 2 Score 0 0 0  Altered sleeping - - 0  Tired, decreased energy - - 0  Change in appetite - - 0  Feeling bad or failure about yourself  - - 0  Trouble concentrating - - 0  Moving slowly or fidgety/restless - - 0  Suicidal thoughts - - 0  PHQ-9 Score - - 0  Difficult doing work/chores - - -    Social History   Tobacco Use  . Smoking status: Never Smoker  . Smokeless tobacco: Never Used  Vaping Use  . Vaping Use: Never used  Substance Use Topics  . Alcohol use: No  . Drug use: No    Review of Systems  Constitutional: Negative.   HENT: Negative.   Eyes: Negative.   Respiratory: Negative.   Cardiovascular: Negative.   Gastrointestinal: Negative.   Endocrine: Negative.   Genitourinary: Negative.   Musculoskeletal: Negative.   Skin: Negative.   Allergic/Immunologic: Negative.   Neurological: Negative.   Hematological: Negative.   Psychiatric/Behavioral: Negative.    Per HPI unless specifically indicated  above     Objective:    BP (!) 193/101 (BP Location: Right Arm, Patient Position: Sitting, Cuff Size: Large)   Pulse 85   Temp 98.1 F (36.7 C) (Oral)   Resp 18   Ht _0  (1.549 m)   Wt 206 lb (93.4 kg)   SpO2 100%   BMI 38.92 kg/m   Wt Readings from Last 3 Encounters:  04/25/20 206 lb (93.4 kg)  10/17/19 200 lb (90.7 kg)  04/20/19 212 lb 6.4 oz (96.3 kg)    Physical Exam Vitals reviewed.  Constitutional:      General: She is not in acute distress.    Appearance: Normal appearance. She is well-developed and well-groomed. She is obese. She is not ill-appearing or toxic-appearing.  HENT:     Head: Normocephalic and atraumatic.     Nose:     Comments: FacLizbeth Bark in place, covering mouth and nose. Eyes:     General: Lids are normal. Vision grossly intact.        Right eye: No discharge.        Left eye: No discharge.     Extraocular Movements: Extraocular movements intact.     Conjunctiva/sclera: Conjunctivae normal.     Pupils: Pupils are equal, round, and reactive to light.  Cardiovascular:     Rate and Rhythm: Normal rate and regular rhythm.     Pulses: Normal  pulses.          Dorsalis pedis pulses are 2+ on the right side and 2+ on the left side.     Heart sounds: Normal heart sounds. No murmur heard.  No friction rub. No gallop.   Pulmonary:     Effort: Pulmonary effort is normal. No respiratory distress.     Breath sounds: Normal breath sounds.  Musculoskeletal:     Right lower leg: No edema.     Left lower leg: No edema.  Skin:    General: Skin is warm and dry.     Capillary Refill: Capillary refill takes less than 2 seconds.  Neurological:     General: No focal deficit present.     Mental Status: She is alert and oriented to person, place, and time.  Psychiatric:        Attention and Perception: Attention and perception normal.        Mood and Affect: Mood and affect normal.        Speech: Speech normal.        Behavior: Behavior normal. Behavior is  cooperative.        Thought Content: Thought content normal.        Cognition and Memory: Cognition and memory normal.        Judgment: Judgment normal.    Results for orders placed or performed in visit on 11/21/18  COMPLETE METABOLIC PANEL WITH GFR  Result Value Ref Range   Glucose, Bld 111 (H) 65 - 99 mg/dL   BUN 61 (H) 7 - 25 mg/dL   Creat 3.85 (H) 0.50 - 0.99 mg/dL   GFR, Est Non African American 11 (L) > OR = 60 mL/min/1.68m   GFR, Est African American 13 (L) > OR = 60 mL/min/1.76m  BUN/Creatinine Ratio 16 6 - 22 (calc)   Sodium 139 135 - 146 mmol/L   Potassium 3.9 3.5 - 5.3 mmol/L   Chloride 110 98 - 110 mmol/L   CO2 18 (L) 20 - 32 mmol/L   Calcium 8.9 8.6 - 10.4 mg/dL   Total Protein 7.7 6.1 - 8.1 g/dL   Albumin 4.1 3.6 - 5.1 g/dL   Globulin 3.6 1.9 - 3.7 g/dL (calc)   AG Ratio 1.1 1.0 - 2.5 (calc)   Total Bilirubin 0.5 0.2 - 1.2 mg/dL   Alkaline phosphatase (APISO) 80 37 - 153 U/L   AST 26 10 - 35 U/L   ALT 18 6 - 29 U/L  Lipid panel  Result Value Ref Range   Cholesterol 130 <200 mg/dL   HDL 78 > OR = 50 mg/dL   Triglycerides 60 <150 mg/dL   LDL Cholesterol (Calc) 38 mg/dL (calc)   Total CHOL/HDL Ratio 1.7 <5.0 (calc)   Non-HDL Cholesterol (Calc) 52 <130 mg/dL (calc)  CBC with Differential/Platelet  Result Value Ref Range   WBC 9.3 3.8 - 10.8 Thousand/uL   RBC 3.40 (L) 3.80 - 5.10 Million/uL   Hemoglobin 10.7 (L) 11.7 - 15.5 g/dL   HCT 33.7 (L) 35 - 45 %   MCV 99.1 80.0 - 100.0 fL   MCH 31.5 27.0 - 33.0 pg   MCHC 31.8 (L) 32.0 - 36.0 g/dL   RDW 12.6 11.0 - 15.0 %   Platelets 245 140 - 400 Thousand/uL   MPV 10.5 7.5 - 12.5 fL   Neutro Abs 4,325 1,500 - 7,800 cells/uL   Lymphs Abs 3,543 850 - 3,900 cells/uL   Absolute Monocytes 679 200 - 950  cells/uL   Eosinophils Absolute 660 (H) 15 - 500 cells/uL   Basophils Absolute 93 0 - 200 cells/uL   Neutrophils Relative % 46.5 %   Total Lymphocyte 38.1 %   Monocytes Relative 7.3 %   Eosinophils Relative 7.1 %     Basophils Relative 1.0 %      Assessment & Plan:   Problem List Items Addressed This Visit      Cardiovascular and Mediastinum   Hypertension - Primary    Uncontrolled hypertension.  BP is not at goal < 130/80.  Pt is not working on lifestyle modifications.  Taking medications tolerating well without side effects.  Currently has CKD Stage V, not currently on dialysis.  Notes showing meets with Dr. Anthonette Legato with [facility].  Patient reports has not met with him in > 3 years. Complications:  CKD stage 5, Obesity, GERD, HLD   Plan: 1. Continue taking losartan 29m daily, amlodipine 138mdaily and metoprolol succinate 10037maily 2. Obtain labs today  3. Encouraged heart healthy diet and increasing exercise to 30 minutes most days of the week, going no more than 2 days in a row without exercise. 4. Check BP 1-2 x per day at home, keep log, and bring to clinic at next appointment. 5. Follow up 2 weeks for BP re-evaluation with taking all of her medications        Relevant Medications   amLODipine (NORVASC) 10 MG tablet   atorvastatin (LIPITOR) 10 MG tablet   metoprolol succinate (TOPROL-XL) 100 MG 24 hr tablet   losartan (COZAAR) 50 MG tablet   Other Relevant Orders   CBC with Differential   COMPLETE METABOLIC PANEL WITH GFR     Digestive   GERD (gastroesophageal reflux disease)    Currently well controlled on omeprazole 67m40mce daily.  Plan: 1. Continue omeprazole 67mg47me daily. Side effects discussed. Pt wants to continue med. 2. Avoid diet triggers. Reviewed need to seek care if globus sensation, difficulty swallowing, s/sx of GI bleed. 3. Follow up as needed and in 3 months.         Genitourinary   CKD stage 5 secondary to hypertension (HCC) [place] Dr. Late[place]ley Raring [facility] in > 3 years.  Last lab for GFR was 04/20/2019 showing GFR of 13.  Last call with patient in April 2021 with usage of ibuprofen 800mg 22mr a dental  procedure and s/s of AKI without follow up with ER or Dr. LateefHolley Raringequested.  Reports making urine regularly.  States does not need to return to Dr. LateefElwyn Ladee or another Nephrology office because she "has the holy spirit in my kidneys".  Educated with her elevated BP and probable worsening of her CKD, it is imperative that she schedule follow up with Dr. LateefHolley Raringlow a referral to another Nephrology provider, patient declines.  Educated on worsening of kidney function, including need for emergent dialysis and possibly death.  Patient verbalized understanding and denied additional questions/concerns.  Plan: 1. Encouraged follow up with Nephrology or establishment with another local provider, pt declined.      Relevant Medications   losartan (COZAAR) 50 MG tablet     Other   Hyperlipidemia    Status unknown.  Recheck labs.  Continue meds without changes today.  Refills provided. Followup after labs.       Relevant Medications   amLODipine (NORVASC) 10 MG tablet   atorvastatin (LIPITOR) 10 MG tablet  metoprolol succinate (TOPROL-XL) 100 MG 24 hr tablet   losartan (COZAAR) 50 MG tablet   Other Relevant Orders   Lipid Profile    Other Visit Diagnoses    Need for pneumococcal vaccine       Relevant Orders   Pneumococcal conjugate vaccine 13-valent (Completed)   Long-term use of high-risk medication       Relevant Orders   Thyroid Panel With TSH   Gastroesophageal reflux disease          Meds ordered this encounter  Medications  . DISCONTD: losartan (COZAAR) 50 MG tablet    Sig: Take 1 tablet (50 mg total) by mouth daily.    Dispense:  90 tablet    Refill:  1  . DISCONTD: losartan (COZAAR) 50 MG tablet    Sig: Take 1 tablet (50 mg total) by mouth daily.    Dispense:  90 tablet    Refill:  0  . amLODipine (NORVASC) 10 MG tablet    Sig: Take 1 tablet (10 mg total) by mouth daily.    Dispense:  90 tablet    Refill:  1  . atorvastatin (LIPITOR) 10 MG tablet    Sig:  Take 2 tablets (20 mg total) by mouth daily.    Dispense:  180 tablet    Refill:  1    Needs office visit prior to further refill  . metoprolol succinate (TOPROL-XL) 100 MG 24 hr tablet    Sig: Take 1 tablet (100 mg total) by mouth daily. Take with or immediately following a meal.    Dispense:  90 tablet    Refill:  1  . losartan (COZAAR) 50 MG tablet    Sig: Take 1 tablet (50 mg total) by mouth daily.    Dispense:  30 tablet    Refill:  0    Follow up plan: Return in about 2 weeks (around 05/09/2020) for HTN F/U.   Harlin Rain, [place] [facility] Nurse Practitioner [facility] 04/25/2020, 12:42 PM

## 2020-04-25 NOTE — Assessment & Plan Note (Signed)
Currently well controlled on omeprazole 20mg  once daily.  Plan: 1. Continue omeprazole 20mg  once daily. Side effects discussed. Pt wants to continue med. 2. Avoid diet triggers. Reviewed need to seek care if globus sensation, difficulty swallowing, s/sx of GI bleed. 3. Follow up as needed and in 3 months.

## 2020-04-25 NOTE — Telephone Encounter (Signed)
Completed.

## 2020-04-25 NOTE — Assessment & Plan Note (Signed)
Pt greater than age 70.  Needs pneumococcal vaccine.  VIS provided.  Plan: 1. Administer pneumococcal vaccine today.

## 2020-04-25 NOTE — Patient Instructions (Signed)
Have your labs drawn and we will contact you with the results.  Refills have been sent to your pharmacy.  It is encouraged that you accept a referral to nephrology for your decreased kidney function.  Please think about this and let me know if you change your mind.  We will plan to see you back in 2 weeks for hypertension follow up visit  You will receive a survey after today's visit either digitally by e-mail or paper by [place] mail. Your experiences and feedback matter to [place].  Please respond so we know how we are doing as we provide care for you.  Call us with any questions/concerns/needs.  It is my goal to be available to you for your health concerns.  Thanks for choosing me to be a partner in your healthcare needs!  Harlin Rain, FNP-C Family Nurse Practitioner [facility] Phone: [phone]

## 2020-04-25 NOTE — Assessment & Plan Note (Signed)
Status unknown.  Recheck labs.  Continue meds without changes today.  Refills provided. Followup after labs.  

## 2020-04-25 NOTE — Assessment & Plan Note (Signed)
Reports has not followed with Dr. Holley Raring with [facility] in > 3 years.  Last lab for GFR was 04/20/2019 showing GFR of 13.  Last call with patient in April 2021 with usage of ibuprofen 800mg  after a dental procedure and s/s of AKI without follow up with ER or Dr. Holley Raring, as requested.  Reports making urine regularly.  States does not need to return to Dr. Elwyn Lade office or another Nephrology office because she "has the holy spirit in my kidneys".  Educated with her elevated BP and probable worsening of her CKD, it is imperative that she schedule follow up with Dr. Holley Raring or allow a referral to another Nephrology provider, patient declines.  Educated on worsening of kidney function, including need for emergent dialysis and possibly death.  Patient verbalized understanding and denied additional questions/concerns.  Plan: 1. Encouraged follow up with Nephrology or establishment with another local provider, pt declined.

## 2020-04-25 NOTE — Telephone Encounter (Signed)
Copied from [place] [id number]. Topic: General - Other >> Apr 25, 2020 11:32 AM Leward Quan A wrote: Reason for CRM: Patient called to ask Cyndia Skeeters to please send a 30 day supply of losartan (COZAAR) 50 MG tablet to [facility] and then the rest to [facility] with the 90 day refill. Please advise per patient she need this today per patient it is too expensive to pick up a 90 day supply at [facility]

## 2020-04-25 NOTE — Assessment & Plan Note (Signed)
Uncontrolled hypertension.  BP is not at goal < 130/80.  Pt is not working on lifestyle modifications.  Taking medications tolerating well without side effects.  Currently has CKD Stage V, not currently on dialysis.  Notes showing meets with Dr. Anthonette Legato with [facility].  Patient reports has not met with him in > 3 years. Complications:  CKD stage 5, Obesity, GERD, HLD   Plan: 1. Continue taking losartan 50m daily, amlodipine 158mdaily and metoprolol succinate 10019maily 2. Obtain labs today  3. Encouraged heart healthy diet and increasing exercise to 30 minutes most days of the week, going no more than 2 days in a row without exercise. 4. Check BP 1-2 x per day at home, keep log, and bring to clinic at next appointment. 5. Follow up 2 weeks for BP re-evaluation with taking all of her medications

## 2020-04-29 LAB — TEST AUTHORIZATION

## 2020-04-29 LAB — THYROID PANEL WITH TSH
Free Thyroxine Index: 1.9 (ref 1.4–3.8)
T3 Uptake: 26 % (ref 22–35)
T4, Total: 7.4 ug/dL (ref 5.1–11.9)
TSH: 4.69 mIU/L — ABNORMAL HIGH (ref 0.40–4.50)

## 2020-04-29 LAB — LIPID PANEL
Cholesterol: 154 mg/dL (ref <200)
HDL: 54 mg/dL (ref >=50)
LDL Cholesterol (Calc): 78 mg/dL (calc)
Non-HDL Cholesterol (Calc): 100 mg/dL (calc) (ref <130)
Total CHOL/HDL Ratio: 2.9 (calc) (ref <5.0)
Triglycerides: 122 mg/dL (ref <150)

## 2020-04-29 LAB — COMPLETE METABOLIC PANEL WITH GFR
AG Ratio: 1.2 (calc) (ref 1.0–2.5)
ALT: 8 U/L (ref 6–29)
AST: 19 U/L (ref 10–35)
Albumin: 4.6 g/dL (ref 3.6–5.1)
Alkaline phosphatase (APISO): 52 U/L (ref 37–153)
BUN/Creatinine Ratio: 14 (calc) (ref 6–22)
BUN: 45 mg/dL — ABNORMAL HIGH (ref 7–25)
CO2: 20 mmol/L (ref 20–32)
Calcium: 9.8 mg/dL (ref 8.6–10.4)
Chloride: 104 mmol/L (ref 98–110)
Creat: 3.15 mg/dL — ABNORMAL HIGH (ref 0.60–0.93)
GFR, Est African American: 17 mL/min/{1.73_m2} — ABNORMAL LOW (ref >=60)
GFR, Est Non African American: 14 mL/min/{1.73_m2} — ABNORMAL LOW (ref >=60)
Globulin: 3.7 g/dL (calc) (ref 1.9–3.7)
Glucose, Bld: 112 mg/dL — ABNORMAL HIGH (ref 65–99)
Potassium: 4.1 mmol/L (ref 3.5–5.3)
Sodium: 137 mmol/L (ref 135–146)
Total Bilirubin: 0.4 mg/dL (ref 0.2–1.2)
Total Protein: 8.3 g/dL — ABNORMAL HIGH (ref 6.1–8.1)

## 2020-04-29 LAB — CBC WITH DIFFERENTIAL/PLATELET
Absolute Monocytes: 932 cells/uL (ref 200–950)
Basophils Absolute: 59 cells/uL (ref 0–200)
Basophils Relative: 0.5 %
Eosinophils Absolute: 791 cells/uL — ABNORMAL HIGH (ref 15–500)
Eosinophils Relative: 6.7 %
HCT: 36.6 % (ref 35.0–45.0)
Hemoglobin: 11.8 g/dL (ref 11.7–15.5)
Lymphs Abs: 3587 cells/uL (ref 850–3900)
MCH: 31.1 pg (ref 27.0–33.0)
MCHC: 32.2 g/dL (ref 32.0–36.0)
MCV: 96.3 fL (ref 80.0–100.0)
MPV: 11 fL (ref 7.5–12.5)
Monocytes Relative: 7.9 %
Neutro Abs: 6431 cells/uL (ref 1500–7800)
Neutrophils Relative %: 54.5 %
Platelets: 296 10*3/uL (ref 140–400)
RBC: 3.8 10*6/uL (ref 3.80–5.10)
RDW: 12.1 % (ref 11.0–15.0)
Total Lymphocyte: 30.4 %
WBC: 11.8 10*3/uL — ABNORMAL HIGH (ref 3.8–10.8)

## 2020-04-29 LAB — HEMOGLOBIN A1C W/OUT EAG: Hgb A1c MFr Bld: 5.1 % of total Hgb (ref <5.7)

## 2020-05-01 ENCOUNTER — Other Ambulatory Visit: Payer: Self-pay | Admitting: Family Medicine

## 2020-05-01 DIAGNOSIS — J301 Allergic rhinitis due to pollen: Secondary | ICD-10-CM

## 2020-05-01 NOTE — Telephone Encounter (Signed)
Requested medication (s) are due for refill today: yes  Requested medication (s) are on the active medication list: yes  Last refill:  03/06/20  #39  1 refill  Future visit scheduled:No  Notes to clinic:  Note states patient reports not taking 04/25/20    Requested Prescriptions  Pending Prescriptions Disp Refills   levocetirizine (XYZAL) 5 MG tablet [Pharmacy Med Name: LEVOCETIRIZINE DIHYDROCHLORIDE 5 MG Tablet] 39 tablet 1    Sig: TAKE 1 TABLET DAILY ON MONDAY, [place]  (DOSE CHANGED DUE TO RENAL FUNCTION AT LAST CHECK)      Ear, Nose, and Throat:  Antihistamines Passed - 05/01/2020  8:17 PM      Passed - Valid encounter within last 12 months    Recent Outpatient Visits           6 days ago Essential hypertension   [facility], Lupita Raider, FNP   4 months ago Nausea   [facility], Lupita Raider, FNP   9 months ago CKD stage 5 secondary to hypertension [facility])   [facility] Olin Hauser, DO   1 year ago Essential hypertension   [facility] Mikey College, NP   1 year ago Gastroesophageal reflux disease, esophagitis presence not specified   [facility] Merrilyn Puma, Jerrel Ivory, NP

## 2020-05-10 ENCOUNTER — Other Ambulatory Visit: Payer: Medicare HMO

## 2020-05-15 ENCOUNTER — Other Ambulatory Visit: Payer: Self-pay | Admitting: Family Medicine

## 2020-05-15 ENCOUNTER — Other Ambulatory Visit: Payer: Medicare HMO

## 2020-05-15 ENCOUNTER — Other Ambulatory Visit: Payer: Self-pay

## 2020-05-15 DIAGNOSIS — D729 Disorder of white blood cells, unspecified: Secondary | ICD-10-CM

## 2020-05-15 DIAGNOSIS — R7989 Other specified abnormal findings of blood chemistry: Secondary | ICD-10-CM | POA: Diagnosis not present

## 2020-05-15 DIAGNOSIS — E782 Mixed hyperlipidemia: Secondary | ICD-10-CM | POA: Diagnosis not present

## 2020-05-16 ENCOUNTER — Other Ambulatory Visit: Payer: Self-pay | Admitting: Family Medicine

## 2020-05-16 DIAGNOSIS — R7989 Other specified abnormal findings of blood chemistry: Secondary | ICD-10-CM

## 2020-05-16 LAB — CBC WITH DIFFERENTIAL/PLATELET
Absolute Monocytes: 924 cells/uL (ref 200–950)
Basophils Absolute: 66 cells/uL (ref 0–200)
Basophils Relative: 0.6 %
Eosinophils Absolute: 946 cells/uL — ABNORMAL HIGH (ref 15–500)
Eosinophils Relative: 8.6 %
HCT: 34.8 % — ABNORMAL LOW (ref 35.0–45.0)
Hemoglobin: 11.1 g/dL — ABNORMAL LOW (ref 11.7–15.5)
Lymphs Abs: 4015 cells/uL — ABNORMAL HIGH (ref 850–3900)
MCH: 30.9 pg (ref 27.0–33.0)
MCHC: 31.9 g/dL — ABNORMAL LOW (ref 32.0–36.0)
MCV: 96.9 fL (ref 80.0–100.0)
MPV: 10.7 fL (ref 7.5–12.5)
Monocytes Relative: 8.4 %
Neutro Abs: 5049 cells/uL (ref 1500–7800)
Neutrophils Relative %: 45.9 %
Platelets: 227 10*3/uL (ref 140–400)
RBC: 3.59 10*6/uL — ABNORMAL LOW (ref 3.80–5.10)
RDW: 12.1 % (ref 11.0–15.0)
Total Lymphocyte: 36.5 %
WBC: 11 10*3/uL — ABNORMAL HIGH (ref 3.8–10.8)

## 2020-05-16 LAB — TSH+FREE T4: TSH W/REFLEX TO FT4: 4.76 mIU/L — ABNORMAL HIGH (ref 0.40–4.50)

## 2020-05-16 LAB — T4, FREE: Free T4: 1 ng/dL (ref 0.8–1.8)

## 2020-05-22 ENCOUNTER — Other Ambulatory Visit: Payer: Self-pay | Admitting: Family Medicine

## 2020-05-22 DIAGNOSIS — K219 Gastro-esophageal reflux disease without esophagitis: Secondary | ICD-10-CM

## 2020-05-24 ENCOUNTER — Ambulatory Visit: Payer: Medicare HMO

## 2020-07-31 ENCOUNTER — Other Ambulatory Visit: Payer: Self-pay | Admitting: Nurse Practitioner

## 2020-07-31 DIAGNOSIS — J301 Allergic rhinitis due to pollen: Secondary | ICD-10-CM

## 2020-09-04 ENCOUNTER — Other Ambulatory Visit: Payer: Self-pay | Admitting: Family Medicine

## 2020-09-04 DIAGNOSIS — I10 Essential (primary) hypertension: Secondary | ICD-10-CM

## 2020-09-04 NOTE — Telephone Encounter (Signed)
Courtesy refill given, appointment needed.

## 2020-10-08 ENCOUNTER — Other Ambulatory Visit: Payer: Self-pay

## 2020-10-08 DIAGNOSIS — I10 Essential (primary) hypertension: Secondary | ICD-10-CM

## 2020-10-08 MED ORDER — AMLODIPINE BESYLATE 10 MG PO TABS: 10.0000 mg | ORAL_TABLET | Freq: Every day | ORAL | 1 refills | Status: DC

## 2020-10-09 ENCOUNTER — Other Ambulatory Visit: Payer: Self-pay

## 2020-10-09 DIAGNOSIS — I10 Essential (primary) hypertension: Secondary | ICD-10-CM

## 2020-10-09 MED ORDER — METOPROLOL SUCCINATE ER 100 MG PO TB24: 100.0000 mg | ORAL_TABLET | Freq: Every day | ORAL | 1 refills | Status: DC

## 2020-10-29 ENCOUNTER — Ambulatory Visit (INDEPENDENT_AMBULATORY_CARE_PROVIDER_SITE_OTHER): Payer: Medicare Other

## 2020-10-29 VITALS — Ht 62.0 in | Wt 185.0 lb

## 2020-10-29 DIAGNOSIS — Z0000 Encounter for general adult medical examination without abnormal findings: Secondary | ICD-10-CM | POA: Diagnosis not present

## 2020-10-29 NOTE — Patient Instructions (Signed)
Destiny Adams , Thank you for taking time to come for your Medicare Wellness Visit. I appreciate your ongoing commitment to your health goals. Please review the following plan we discussed and let me know if I can assist you in the future.   Screening recommendations/referrals: Colonoscopy: completed 07/29/2016 Mammogram: decline Bone Density: decline Recommended yearly ophthalmology/optometry visit for glaucoma screening and checkup Recommended yearly dental visit for hygiene and checkup  Vaccinations: Influenza vaccine: decline Pneumococcal vaccine: completed 04/25/2020 Tdap vaccine: due Shingles vaccine: discussed   Covid-19: 06/10/2020, 10/18/2019, 09/27/2019  Advanced directives: Advance directive discussed with you today.   Conditions/risks identified: none  Next appointment: Follow up in one year for your annual wellness visit    Preventive Care 65 Years and Older, Female Preventive care refers to lifestyle choices and visits with your health care provider that can promote health and wellness. What does preventive care include?  A yearly physical exam. This is also called an annual well check.  Dental exams once or twice a year.  Routine eye exams. Ask your health care provider how often you should have your eyes checked.  Personal lifestyle choices, including:  Daily care of your teeth and gums.  Regular physical activity.  Eating a healthy diet.  Avoiding tobacco and drug use.  Limiting alcohol use.  Practicing safe sex.  Taking low-dose aspirin every day.  Taking vitamin and mineral supplements as recommended by your health care provider. What happens during an annual well check? The services and screenings done by your health care provider during your annual well check will depend on your age, overall health, lifestyle risk factors, and family history of disease. Counseling  Your health care provider may ask you questions about your:  Alcohol use.  Tobacco  use.  Drug use.  Emotional well-being.  Home and relationship well-being.  Sexual activity.  Eating habits.  History of falls.  Memory and ability to understand (cognition).  Work and work Statistician.  Reproductive health. Screening  You may have the following tests or measurements:  Height, weight, and BMI.  Blood pressure.  Lipid and cholesterol levels. These may be checked every 5 years, or more frequently if you are over 61 years old.  Skin check.  Lung cancer screening. You may have this screening every year starting at age 96 if you have a 30-pack-year history of smoking and currently smoke or have quit within the past 15 years.  Fecal occult blood test (FOBT) of the stool. You may have this test every year starting at age 56.  Flexible sigmoidoscopy or colonoscopy. You may have a sigmoidoscopy every 5 years or a colonoscopy every 10 years starting at age 56.  Hepatitis C blood test.  Hepatitis B blood test.  Sexually transmitted disease (STD) testing.  Diabetes screening. This is done by checking your blood sugar (glucose) after you have not eaten for a while (fasting). You may have this done every 1-3 years.  Bone density scan. This is done to screen for osteoporosis. You may have this done starting at age 12.  Mammogram. This may be done every 1-2 years. Talk to your health care provider about how often you should have regular mammograms. Talk with your health care provider about your test results, treatment options, and if necessary, the need for more tests. Vaccines  Your health care provider may recommend certain vaccines, such as:  Influenza vaccine. This is recommended every year.  Tetanus, diphtheria, and acellular pertussis (Tdap, Td) vaccine. You may need a  Td booster every 10 years.  Zoster vaccine. You may need this after age 41.  Pneumococcal 13-valent conjugate (PCV13) vaccine. One dose is recommended after age 78.  Pneumococcal  polysaccharide (PPSV23) vaccine. One dose is recommended after age 81. Talk to your health care provider about which screenings and vaccines you need and how often you need them. This information is not intended to replace advice given to you by your health care provider. Make sure you discuss any questions you have with your health care provider. Document Released: 08/30/2015 Document Revised: 04/22/2016 Document Reviewed: 06/04/2015 Elsevier Interactive Patient Education  2017 [place] Prevention in the Home Falls can cause injuries. They can happen to people of all ages. There are many things you can do to make your home safe and to help prevent falls. What can I do on the outside of my home?  Regularly fix the edges of walkways and driveways and fix any cracks.  Remove anything that might make you trip as you walk through a door, such as a raised step or threshold.  Trim any bushes or trees on the path to your home.  Use bright outdoor lighting.  Clear any walking paths of anything that might make someone trip, such as rocks or tools.  Regularly check to see if handrails are loose or broken. Make sure that both sides of any steps have handrails.  Any raised decks and porches should have guardrails on the edges.  Have any leaves, snow, or ice cleared regularly.  Use sand or salt on walking paths during winter.  Clean up any spills in your garage right away. This includes oil or grease spills. What can I do in the bathroom?  Use night lights.  Install grab bars by the toilet and in the tub and shower. Do not use towel bars as grab bars.  Use non-skid mats or decals in the tub or shower.  If you need to sit down in the shower, use a plastic, non-slip stool.  Keep the floor dry. Clean up any water that spills on the floor as soon as it happens.  Remove soap buildup in the tub or shower regularly.  Attach bath mats securely with double-sided non-slip rug  tape.  Do not have throw rugs and other things on the floor that can make you trip. What can I do in the bedroom?  Use night lights.  Make sure that you have a light by your bed that is easy to reach.  Do not use any sheets or blankets that are too big for your bed. They should not hang down onto the floor.  Have a firm chair that has side arms. You can use this for support while you get dressed.  Do not have throw rugs and other things on the floor that can make you trip. What can I do in the kitchen?  Clean up any spills right away.  Avoid walking on wet floors.  Keep items that you use a lot in easy-to-reach places.  If you need to reach something above you, use a strong step stool that has a grab bar.  Keep electrical cords out of the way.  Do not use floor polish or wax that makes floors slippery. If you must use wax, use non-skid floor wax.  Do not have throw rugs and other things on the floor that can make you trip. What can I do with my stairs?  Do not leave any items on the stairs.  Make sure that there are handrails on both sides of the stairs and use them. Fix handrails that are broken or loose. Make sure that handrails are as long as the stairways.  Check any carpeting to make sure that it is firmly attached to the stairs. Fix any carpet that is loose or worn.  Avoid having throw rugs at the top or bottom of the stairs. If you do have throw rugs, attach them to the floor with carpet tape.  Make sure that you have a light switch at the top of the stairs and the bottom of the stairs. If you do not have them, ask someone to add them for you. What else can I do to help prevent falls?  Wear shoes that:  Do not have high heels.  Have rubber bottoms.  Are comfortable and fit you well.  Are closed at the toe. Do not wear sandals.  If you use a stepladder:  Make sure that it is fully opened. Do not climb a closed stepladder.  Make sure that both sides of the  stepladder are locked into place.  Ask someone to hold it for you, if possible.  Clearly mark and make sure that you can see:  Any grab bars or handrails.  First and last steps.  Where the edge of each step is.  Use tools that help you move around (mobility aids) if they are needed. These include:  Canes.  Walkers.  Scooters.  Crutches.  Turn on the lights when you go into a dark area. Replace any light bulbs as soon as they burn out.  Set up your furniture so you have a clear path. Avoid moving your furniture around.  If any of your floors are uneven, fix them.  If there are any pets around you, be aware of where they are.  Review your medicines with your doctor. Some medicines can make you feel dizzy. This can increase your chance of falling. Ask your doctor what other things that you can do to help prevent falls. This information is not intended to replace advice given to you by your health care provider. Make sure you discuss any questions you have with your health care provider. Document Released: 05/30/2009 Document Revised: 01/09/2016 Document Reviewed: 09/07/2014 Elsevier Interactive Patient Education  2017 [employer].

## 2020-10-29 NOTE — Progress Notes (Signed)
I connected with Destiny Adams today by telephone and verified that I am speaking with the correct person using two identifiers. Location patient: home Location provider: work Persons participating in the virtual visit: [place], Kindig LPN.   I discussed the limitations, risks, security and privacy concerns of performing an evaluation and management service by telephone and the availability of in person appointments. I also discussed with the patient that there may be a patient responsible charge related to this service. The patient expressed understanding and verbally consented to this telephonic visit.    Interactive audio and video telecommunications were attempted between this provider and patient, however failed, due to patient having technical difficulties OR patient did not have access to video capability.  We continued and completed visit with audio only.     Vital signs may be patient reported or missing.  Subjective:   Destiny Adams is a 70 y.o. female who presents for Medicare Annual (Subsequent) preventive examination.  Review of Systems     Cardiac Risk Factors include: advanced age (>53mn, >>18women);dyslipidemia;hypertension;obesity (BMI >30kg/m2)     Objective:    Today's Vitals   10/29/20 0857  Weight: 185 lb (83.9 kg)  Height: '5\' 2"'$  (1.575 m)   Body mass index is 33.84 kg/m.  Advanced Directives 10/29/2020 10/17/2019 03/16/2018 12/14/2017 07/29/2016  Does Patient Have a Medical Advance Directive? No No No No No  Would patient like information on creating a medical advance directive? - - No - Patient declined Yes (MAU/Ambulatory/Procedural Areas - Information given) No - Patient declined    Current Medications (verified) Outpatient Encounter Medications as of 10/29/2020  Medication Sig  . acetaminophen (TYLENOL) 650 MG CR tablet Take 650 mg by mouth every 8 (eight) hours as needed for pain.  .Marland KitchenamLODipine (NORVASC) 10 MG tablet Take 1 tablet (10 mg  total) by mouth daily.  .Marland Kitchenatorvastatin (LIPITOR) 10 MG tablet Take 2 tablets (20 mg total) by mouth daily.  . calcitRIOL (ROCALTROL) 0.5 MCG capsule Take 0.5 mcg by mouth daily.  .Marland Kitchenlosartan (COZAAR) 50 MG tablet Take 1 tablet (50 mg total) by mouth daily.  . metoprolol succinate (TOPROL-XL) 100 MG 24 hr tablet Take 1 tablet (100 mg total) by mouth daily. Take with or immediately following a meal.  . omeprazole (PRILOSEC) 20 MG capsule TAKE 1 CAPSULE EVERY DAY  . levocetirizine (XYZAL) 5 MG tablet TAKE 1 TABLET DAILY ON MONDAY, WEDNESDAY AND FRIDAY  (DOSE CHANGED DUE TO RENAL FUNCTION AT LAST CHECK) (Patient not taking: Reported on 10/29/2020)  . SODIUM BICARBONATE PO Take 10 g by mouth. (Patient not taking: No sig reported)   No facility-administered encounter medications on file as of 10/29/2020.    Allergies (verified) Patient has no known allergies.   History: Past Medical History:  Diagnosis Date  . Anemia   . CKD (chronic kidney disease) stage 4, GFR 15-29 ml/min (HCC) 07/23/2017  . GERD (gastroesophageal reflux disease)   . Hyperlipidemia   . Hypertension   . Severe obesity (BMI 35.0-35.9 with comorbidity) ([facility] 07/23/2017   Comorbid CKD stage IV, Hypertension, Hyperlipidemia   Past Surgical History:  Procedure Laterality Date  . COLONOSCOPY WITH PROPOFOL N/A 07/29/2016   Procedure: COLONOSCOPY WITH PROPOFOL;  Surgeon: JRobert Bellow MD;  [facility];  Service: Endoscopy;  Laterality: N/A;  . TUBAL LIGATION     Family History  Problem Relation Age of Onset  . Colon cancer Maternal Uncle   . Colon cancer Paternal Uncle   .  Breast cancer Paternal Aunt        45  . Alzheimer's disease Mother   . Cancer Father    Social History   Socioeconomic History  . Marital status: Divorced    Spouse name: Not on file  . Number of children: Not on file  . Years of education: Not on file  . Highest education level: Not on file  Occupational History  . Occupation:  retired  Tobacco Use  . Smoking status: Never Smoker  . Smokeless tobacco: Never Used  Vaping Use  . Vaping Use: Never used  Substance and Sexual Activity  . Alcohol use: No  . Drug use: No  . Sexual activity: Not on file  Other Topics Concern  . Not on file  Social History Narrative  . Not on file   Social Determinants of Health   Financial Resource Strain: Low Risk   . Difficulty of Paying Living Expenses: Not hard at all  Food Insecurity: No Food Insecurity  . Worried About Charity fundraiser in the Last Year: Never true  . Ran Out of Food in the Last Year: Never true  Transportation Needs: No Transportation Needs  . Lack of Transportation (Medical): No  . Lack of Transportation (Non-Medical): No  Physical Activity: Insufficiently Active  . Days of Exercise per Week: 3 days  . Minutes of Exercise per Session: 30 min  Stress: No Stress Concern Present  . Feeling of Stress : Not at all  Social Connections: Not on file    Tobacco Counseling Counseling given: Not Answered   Clinical Intake:  Pre-visit preparation completed: Yes  Pain : No/denies pain     Nutritional Status: BMI > 30  Obese Nutritional Risks: None Diabetes: No  How often do you need to have someone help you when you read instructions, pamphlets, or other written materials from your doctor or pharmacy?: 1 - Never What is the last grade level you completed in school?: 11th grade  Diabetic? no     Information entered by :: NAllen LPN   Activities of Daily Living In your present state of health, do you have any difficulty performing the following activities: 10/29/2020  Hearing? N  Vision? N  Difficulty concentrating or making decisions? N  Walking or climbing stairs? N  Dressing or bathing? N  Doing errands, shopping? N  Preparing Food and eating ? N  Using the Toilet? N  In the past six months, have you accidently leaked urine? N  Do you have problems with loss of bowel control? N   Managing your Medications? N  Managing your Finances? N  Housekeeping or managing your Housekeeping? N  Some recent data might be hidden    Patient Care Team: Malfi, Lupita Raider, FNP as PCP - General (Family Medicine) Bary Castilla, Forest Gleason, MD (General Surgery)  Indicate any recent Medical Services you may have received from other than [facility] providers in the past year (date may be approximate).     Assessment:   This is a routine wellness examination for Destiny Adams.  Hearing/Vision screen  Hearing Screening   '125Hz'$  '250Hz'$  '500Hz'$  '1000Hz'$  '2000Hz'$  '3000Hz'$  '4000Hz'$  '6000Hz'$  '8000Hz'$   Right ear:           Left ear:           Vision Screening Comments: No regular eye exams, Dr. Gloriann Loan  Dietary issues and exercise activities discussed: Current Exercise Habits: Home exercise routine, Type of exercise: walking, Time (Minutes): 30, Frequency (Times/Week): 3, Weekly Exercise (  Minutes/Week): 90  Goals    . DIET - INCREASE WATER INTAKE     Recommend drinking at least 6-8 glasses of water a day     . Patient Stated     10/29/2020, wants to weigh 160 pounds      Depression Screen PHQ 2/9 Scores 10/29/2020 10/17/2019 07/19/2019 04/20/2019 12/14/2017 07/22/2017  PHQ - 2 Score 0 0 0 0 0 0  PHQ- 9 Score - - - 0 4 -    Fall Risk Fall Risk  10/29/2020 10/17/2019 07/19/2019 12/14/2017 07/22/2017  Falls in the past year? 0 0 0 No No  Number falls in past yr: - 0 0 - -  Injury with Fall? - 0 - - -  Risk for fall due to : Medication side effect - - - -  Follow up Falls evaluation completed;Education provided;Falls prevention discussed - Falls evaluation completed - -    FALL RISK PREVENTION PERTAINING TO THE HOME:  Any stairs in or around the home? No  If so, are there any without handrails? n/a Home free of loose throw rugs in walkways, pet beds, electrical cords, etc? Yes  Adequate lighting in your home to reduce risk of falls? Yes   ASSISTIVE DEVICES UTILIZED TO PREVENT FALLS:  Life alert? No  Use of a cane, walker  or w/c? No  Grab bars in the bathroom? Yes  Shower chair or bench in shower? No  Elevated toilet seat or a handicapped toilet? No   TIMED UP AND GO:  Was the test performed? No . .    Cognitive Function:     6CIT Screen 10/29/2020 12/14/2017  What Year? 0 points 0 points  What month? 0 points 0 points  What time? 0 points 0 points  Count back from 20 0 points 0 points  Months in reverse 0 points 0 points  Repeat phrase 0 points 0 points  Total Score 0 0    Immunizations Immunization History  Administered Date(s) Administered  . Hepatitis A, Adult 04/26/2018  . Hepatitis B, adult 04/26/2018, 05/27/2018, 04/26/2019  . Moderna Sars-Covid-2 Vaccination 09/27/2019, 10/18/2019, 06/10/2020  . Pneumococcal Conjugate-13 12/14/2017, 04/25/2020    TDAP status: Due, Education has been provided regarding the importance of this vaccine. Advised may receive this vaccine at local pharmacy or Health Dept. Aware to provide a copy of the vaccination record if obtained from local pharmacy or Health Dept. Verbalized acceptance and understanding.  Flu Vaccine status: Declined, Education has been provided regarding the importance of this vaccine but patient still declined. Advised may receive this vaccine at local pharmacy or Health Dept. Aware to provide a copy of the vaccination record if obtained from local pharmacy or Health Dept. Verbalized acceptance and understanding.  Pneumococcal vaccine status: Up to date  Covid-19 vaccine status: Completed vaccines  Qualifies for Shingles Vaccine? Yes   Zostavax completed No   Shingrix Completed?: No.    Education has been provided regarding the importance of this vaccine. Patient has been advised to call insurance company to determine out of pocket expense if they have not yet received this vaccine. Advised may also receive vaccine at local pharmacy or Health Dept. Verbalized acceptance and understanding.  Screening Tests Health Maintenance  Topic  Date Due  . TETANUS/TDAP  Never done  . MAMMOGRAM  Never done  . DEXA SCAN  Never done  . INFLUENZA VACCINE  11/14/2020 (Originally 03/17/2020)  . PNA vac Low Risk Adult (2 of 2 - PPSV23) 04/25/2021  . COLONOSCOPY (  Pts 45-31yr Insurance coverage will need to be confirmed)  07/29/2026  . COVID-19 Vaccine  Completed  . Hepatitis C Screening  Completed  . HPV VACCINES  Aged Out    Health Maintenance  Health Maintenance Due  Topic Date Due  . TETANUS/TDAP  Never done  . MAMMOGRAM  Never done  . DEXA SCAN  Never done    Colorectal cancer screening: Type of screening: Colonoscopy. Completed 07/29/2016. Repeat every 10 years  Mammogram status: decline  Bone Density status: decline  Lung Cancer Screening: (Low Dose CT Chest recommended if Age 71-80years, 30 pack-year currently smoking OR have quit w/in 15years.) does not qualify.   Lung Cancer Screening Referral: no  Additional Screening:  Hepatitis C Screening: does qualify; Completed 03/22/2018  Vision Screening: Recommended annual ophthalmology exams for early detection of glaucoma and other disorders of the eye. Is the patient up to date with their annual eye exam?  No  Who is the provider or what is the name of the office in which the patient attends annual eye exams? Dr. BGloriann LoanIf pt is not established with a provider, would they like to be referred to a provider to establish care? No .   Dental Screening: Recommended annual dental exams for proper oral hygiene  Community Resource Referral / Chronic Care Management: CRR required this visit?  No   CCM required this visit?  No      Plan:     I have personally reviewed and noted the following in the patient's chart:   . Medical and social history . Use of alcohol, tobacco or illicit drugs  . Current medications and supplements . Functional ability and status . Nutritional status . Physical activity . Advanced directives . List of other  physicians . Hospitalizations, surgeries, and ER visits in previous 12 months . Vitals . Screenings to include cognitive, depression, and falls . Referrals and appointments  In addition, I have reviewed and discussed with patient certain preventive protocols, quality metrics, and best practice recommendations. A written personalized care plan for preventive services as well as general preventive health recommendations were provided to patient.     NKellie Simmering LPN   [id number]  Nurse Notes:

## 2020-12-12 ENCOUNTER — Ambulatory Visit: Payer: Medicare Other | Admitting: Family Medicine

## 2021-01-02 ENCOUNTER — Ambulatory Visit: Payer: Medicare Other | Admitting: Family Medicine

## 2021-01-02 ENCOUNTER — Ambulatory Visit: Payer: Medicare Other | Admitting: Internal Medicine

## 2021-01-06 ENCOUNTER — Encounter: Payer: Self-pay | Admitting: Internal Medicine

## 2021-01-06 ENCOUNTER — Other Ambulatory Visit: Payer: Self-pay

## 2021-01-06 ENCOUNTER — Telehealth: Payer: Self-pay

## 2021-01-06 ENCOUNTER — Ambulatory Visit (INDEPENDENT_AMBULATORY_CARE_PROVIDER_SITE_OTHER): Payer: Medicare Other | Admitting: Internal Medicine

## 2021-01-06 VITALS — BP 164/90 | HR 71 | Temp 97.1°F | Resp 18 | Ht 62.0 in | Wt 195.8 lb

## 2021-01-06 DIAGNOSIS — I10 Essential (primary) hypertension: Secondary | ICD-10-CM

## 2021-01-06 DIAGNOSIS — K219 Gastro-esophageal reflux disease without esophagitis: Secondary | ICD-10-CM | POA: Diagnosis not present

## 2021-01-06 DIAGNOSIS — N2581 Secondary hyperparathyroidism of renal origin: Secondary | ICD-10-CM | POA: Diagnosis not present

## 2021-01-06 DIAGNOSIS — E66812 Obesity, class 2: Secondary | ICD-10-CM

## 2021-01-06 DIAGNOSIS — Z6835 Body mass index (BMI) 35.0-35.9, adult: Secondary | ICD-10-CM

## 2021-01-06 DIAGNOSIS — E782 Mixed hyperlipidemia: Secondary | ICD-10-CM

## 2021-01-06 DIAGNOSIS — N185 Chronic kidney disease, stage 5: Secondary | ICD-10-CM

## 2021-01-06 DIAGNOSIS — I120 Hypertensive chronic kidney disease with stage 5 chronic kidney disease or end stage renal disease: Secondary | ICD-10-CM | POA: Diagnosis not present

## 2021-01-06 MED ORDER — LOSARTAN POTASSIUM 50 MG PO TABS: 50.0000 mg | ORAL_TABLET | Freq: Every day | ORAL | 1 refills | Status: DC

## 2021-01-06 NOTE — Assessment & Plan Note (Signed)
Uncontrolled but out of her medication Reinforced DASH diet and exercise for weight loss Continue Amlodipine and Metoprolol Losartan refilled today She refused BMET today

## 2021-01-06 NOTE — Assessment & Plan Note (Signed)
Encouraged her to consume a low fat diet Continue Atorvastatin Will monitor

## 2021-01-06 NOTE — Assessment & Plan Note (Signed)
She refused labs today Continue Calcitrol and Sodium Bicarb

## 2021-01-06 NOTE — Assessment & Plan Note (Signed)
Avoid foods that trigger your reflux Encouraged weight loss as this can help reduce reflux symptoms Continue Omeprazole as needed

## 2021-01-06 NOTE — Progress Notes (Signed)
Subjective:    Patient ID: Destiny Adams, female    DOB: 16-Feb-1951, 70 y.o.   MRN: [id number]  HPI  Patient presents to clinic today for follow-up of chronic conditions.  She is establishing care with me today, transferring care from Lonie Peak, NP.  HTN: Her BP today is 169/88.  She is taking Amlodipine and Metoprolol as prescribed but has run out of her Losartan, she would like this refilled today.  There is no ECG on file.  HLD: Her last LDL was 78, triglycerides 122, 04/2020.  She denies myalgias on Atorvastatin.  She tries to consume a low-fat diet.  GERD: Triggered by greasy and acidic food.  She takes Omeprazole as needed.  There is no upper GI on file.  Hyperparathyroidism: Secondary to CKD.  Her TSH typically runs a little high but free T4's are normal.  Her calcium is normal. She takes Calcitrol and Sodium Bicarb as prescribed.  CKD V: Her last creatinine was 3.15, GFR 14, 04/2020. She is making urine. She is taking Losartan as prescribed. She does not follow with nephrology.  Review of Systems      Past Medical History:  Diagnosis Date  . Anemia   . CKD (chronic kidney disease) stage 4, GFR 15-29 ml/min (HCC) 07/23/2017  . GERD (gastroesophageal reflux disease)   . Hyperlipidemia   . Hypertension   . Severe obesity (BMI 35.0-35.9 with comorbidity) ([place]) 07/23/2017   Comorbid CKD stage IV, Hypertension, Hyperlipidemia    Current Outpatient Medications  Medication Sig Dispense Refill  . acetaminophen (TYLENOL) 650 MG CR tablet Take 650 mg by mouth every 8 (eight) hours as needed for pain.    Marland Kitchen amLODipine (NORVASC) 10 MG tablet Take 1 tablet (10 mg total) by mouth daily. 90 tablet 1  . atorvastatin (LIPITOR) 10 MG tablet Take 2 tablets (20 mg total) by mouth daily. 180 tablet 1  . calcitRIOL (ROCALTROL) 0.5 MCG capsule Take 0.5 mcg by mouth daily.    Marland Kitchen levocetirizine (XYZAL) 5 MG tablet TAKE 1 TABLET DAILY ON MONDAY, WEDNESDAY AND FRIDAY  (DOSE CHANGED DUE TO RENAL  FUNCTION AT LAST CHECK) (Patient not taking: Reported on 10/29/2020) 39 tablet 1  . losartan (COZAAR) 50 MG tablet Take 1 tablet (50 mg total) by mouth daily. 30 tablet 0  . metoprolol succinate (TOPROL-XL) 100 MG 24 hr tablet Take 1 tablet (100 mg total) by mouth daily. Take with or immediately following a meal. 90 tablet 1  . omeprazole (PRILOSEC) 20 MG capsule TAKE 1 CAPSULE EVERY DAY 90 capsule 1  . SODIUM BICARBONATE PO Take 10 g by mouth. (Patient not taking: No sig reported)     No current facility-administered medications for this visit.    No Known Allergies  Family History  Problem Relation Age of Onset  . Colon cancer Maternal Uncle   . Colon cancer Paternal Uncle   . Breast cancer Paternal Aunt        43  . Alzheimer's disease Mother   . Cancer Father     Social History   Socioeconomic History  . Marital status: Divorced    Spouse name: Not on file  . Number of children: Not on file  . Years of education: Not on file  . Highest education level: Not on file  Occupational History  . Occupation: retired  Tobacco Use  . Smoking status: Never Smoker  . Smokeless tobacco: Never Used  Vaping Use  . Vaping Use: Never used  Substance  and Sexual Activity  . Alcohol use: No  . Drug use: No  . Sexual activity: Not on file  Other Topics Concern  . Not on file  Social History Narrative  . Not on file   Social Determinants of Health   Financial Resource Strain: Low Risk   . Difficulty of Paying Living Expenses: Not hard at all  Food Insecurity: No Food Insecurity  . Worried About Charity fundraiser in the Last Year: Never true  . Ran Out of Food in the Last Year: Never true  Transportation Needs: No Transportation Needs  . Lack of Transportation (Medical): No  . Lack of Transportation (Non-Medical): No  Physical Activity: Insufficiently Active  . Days of Exercise per Week: 3 days  . Minutes of Exercise per Session: 30 min  Stress: No Stress Concern Present  .  Feeling of Stress : Not at all  Social Connections: Not on file  Intimate Partner Violence: Not on file     Constitutional: Denies fever, malaise, fatigue, headache or abrupt weight changes.  Respiratory: Denies difficulty breathing, shortness of breath, cough or sputum production.   Cardiovascular: Denies chest pain, chest tightness, palpitations or swelling in the hands or feet.  Gastrointestinal: Denies abdominal pain, bloating, constipation, diarrhea or blood in the stool.  Musculoskeletal: Denies decrease in range of motion, difficulty with gait, muscle pain or joint pain and swelling.  Skin: Denies redness, rashes, lesions or ulcercations.  Neurological: Denies dizziness, difficulty with memory, difficulty with speech or problems with balance and coordination.    No other specific complaints in a complete review of systems (except as listed in HPI above).  Objective:   Physical Exam BP (!) 169/88 (BP Location: Right Arm, Patient Position: Sitting, Cuff Size: Large)   Pulse 71   Temp (!) 97.1 F (36.2 C) (Temporal)   Resp 18   Ht '5\' 2"'$  (1.575 m)   Wt 195 lb 12.8 oz (88.8 kg)   SpO2 100%   BMI 35.81 kg/m   Wt Readings from Last 3 Encounters:  10/29/20 185 lb (83.9 kg)  04/25/20 206 lb (93.4 kg)  10/17/19 200 lb (90.7 kg)    General: Appears her stated age, obese, in NAD. HEENT: Head: normal shape and size; Eyes: sclera white and EOMs intact;  Cardiovascular: Normal rate and rhythm. S1,S2 noted.  No murmur, rubs or gallops noted. No JVD or BLE edema. No carotid bruits noted. Pulmonary/Chest: Normal effort and positive vesicular breath sounds. No respiratory distress. No wheezes, rales or ronchi noted.  Abdomen:  Normal bowel sounds.  Musculoskeletal:  No difficulty with gait.  Neurological: Alert and oriented.  Psychiatric: Mood and affect normal. Behavior is normal. Judgment and thought content normal.     BMET    Component Value Date/Time   NA 137 04/25/2020  0957   K 4.1 04/25/2020 0957   CL 104 04/25/2020 0957   CO2 20 04/25/2020 0957   GLUCOSE 112 (H) 04/25/2020 0957   BUN 45 (H) 04/25/2020 0957   CREATININE 3.15 (H) 04/25/2020 0957   CALCIUM 9.8 04/25/2020 0957   GFRNONAA 14 (L) 04/25/2020 0957   GFRAA 17 (L) 04/25/2020 0957    Lipid Panel     Component Value Date/Time   CHOL 154 04/25/2020 0957   TRIG 122 04/25/2020 0957   HDL 54 04/25/2020 0957   CHOLHDL 2.9 04/25/2020 0957   LDLCALC 78 04/25/2020 0957    CBC    Component Value Date/Time   WBC 11.0 (H)  05/15/2020 0838   RBC 3.59 (L) 05/15/2020 0838   HGB 11.1 (L) 05/15/2020 0838   HCT 34.8 (L) 05/15/2020 0838   PLT 227 05/15/2020 0838   MCV 96.9 05/15/2020 0838   MCH 30.9 05/15/2020 0838   MCHC 31.9 (L) 05/15/2020 0838   RDW 12.1 05/15/2020 0838   LYMPHSABS 4,015 (H) 05/15/2020 0838   MONOABS 1.1 (H) 03/16/2018 0801   EOSABS 946 (H) 05/15/2020 0838   BASOSABS 66 05/15/2020 0838    Hgb A1C Lab Results  Component Value Date   HGBA1C 5.1 04/25/2020            Assessment and Plan:  Webb Silversmith, NP This visit occurred during the SARS-CoV-2 public health emergency.  Safety protocols were in place, including screening questions prior to the visit, additional usage of staff PPE, and extensive cleaning of exam room while observing appropriate contact time as indicated for disinfecting solutions.

## 2021-01-06 NOTE — Assessment & Plan Note (Signed)
She refused BMET today She refuses referral to nephrology

## 2021-01-06 NOTE — Telephone Encounter (Signed)
Copied from [facility] [id number]. Topic: General - Other >> Jan 06, 2021 11:34 AM Leward Quan A wrote: Reason for CRM: Patient called in to inform Webb Silversmith that she changed her mind about using the mail order pharmacy and want to use [facility] only

## 2021-01-06 NOTE — Patient Instructions (Signed)

## 2021-01-07 NOTE — Telephone Encounter (Signed)
Pt called today saying the office needs to let [facility] know of all the medicatins that she is currently taking.  The pharmacy told her to call the office and tell them this.

## 2021-03-24 ENCOUNTER — Other Ambulatory Visit: Payer: Self-pay | Admitting: Internal Medicine

## 2021-03-24 DIAGNOSIS — I120 Hypertensive chronic kidney disease with stage 5 chronic kidney disease or end stage renal disease: Secondary | ICD-10-CM

## 2021-03-24 MED ORDER — CALCITRIOL 0.5 MCG PO CAPS: 0.5000 ug | ORAL_CAPSULE | Freq: Every day | ORAL | 1 refills | Status: DC

## 2021-03-24 NOTE — Telephone Encounter (Signed)
Medication Refill - Medication: Calcitriol   Has the patient contacted their pharmacy? Yes.   Pt calling stating that she contacted pharmacy and that they do not have this on file. Please advise.  (Agent: If no, request that the patient contact the pharmacy for the refill.) (Agent: If yes, when and what did the pharmacy advise?)  Preferred Pharmacy (with phone number or street name):  [facility] (N), [place] - [address]  [place] [address]  Phone: [phone] Fax: [phone]  Hours: Not open 24 hours   Agent: Please be advised that RX refills may take up to 3 business days. We ask that you follow-up with your pharmacy.

## 2021-03-24 NOTE — Telephone Encounter (Signed)
  Notes to clinic:  Medication filled by a historical provider  Review for continued use and refill   Requested Prescriptions  Pending Prescriptions Disp Refills   calcitRIOL (ROCALTROL) 0.5 MCG capsule      Sig: Take 1 capsule (0.5 mcg total) by mouth daily.      Endocrinology:  Vitamins - Vitamin D Supplementation Failed - 03/24/2021  1:50 PM      Failed - 50,000 IU strengths are not delegated      Failed - Phosphate in normal range and within 360 days    No results found for: PHOS        Failed - Vitamin D in normal range and within 360 days    No results found for: [id number], [id number], [id number], [place], 25OHVITD2, 25OHVITD3, 25OHVITD2, 25OHVITD1, 25OHVITD2, 25OHVITD3, VD25OH        Passed - Ca in normal range and within 360 days    Calcium  Date Value Ref Range Status  04/25/2020 9.8 8.6 - 10.4 mg/dL Final          Passed - Valid encounter within last 12 months    Recent Outpatient Visits           2 months ago Class 2 severe obesity due to excess calories with serious comorbidity and body mass index (BMI) of 35.0 to 35.9 in adult [facility])   [facility], Coralie Keens, NP   11 months ago Essential hypertension   [facility], Lupita Raider, FNP   1 year ago Nausea   [facility], Lupita Raider, FNP   1 year ago CKD stage 5 secondary to hypertension [facility])   [facility] Olin Hauser, DO   1 year ago Essential hypertension   [facility] Mikey College, NP       Future Appointments             In 7 months [facility], [facility]

## 2021-05-14 ENCOUNTER — Other Ambulatory Visit: Payer: Self-pay | Admitting: Internal Medicine

## 2021-05-14 DIAGNOSIS — I10 Essential (primary) hypertension: Secondary | ICD-10-CM

## 2021-05-14 MED ORDER — METOPROLOL SUCCINATE ER 100 MG PO TB24: 100.0000 mg | ORAL_TABLET | Freq: Every day | ORAL | 0 refills | Status: DC

## 2021-05-14 NOTE — Telephone Encounter (Signed)
Medication Refill - Medication: Metoprolol   Has the patient contacted their pharmacy? Yes.   Pt states that she did contact pharmacy and that they had not received a response from PCP. Please advise.  (Agent: If no, request that the patient contact the pharmacy for the refill.) (Agent: If yes, when and what did the pharmacy advise?)  Preferred Pharmacy (with phone number or street name):  [facility] ([facility]), [place] - [address]  [place] [address]  Phone: [phone] Fax: [phone]  Hours: Not open 24 hours   Has the patient been seen for an appointment in the last year OR does the patient have an upcoming appointment? Yes.    Agent: Please be advised that RX refills may take up to 3 business days. We ask that you follow-up with your pharmacy.

## 2021-08-04 ENCOUNTER — Telehealth: Payer: Self-pay | Admitting: Internal Medicine

## 2021-08-04 NOTE — Telephone Encounter (Signed)
Medication Refill - Medication: losartan (COZAAR  Has the patient contacted their pharmacy? No. (Agent: If no, request that the patient contact the pharmacy for the refill. If patient does not wish to contact the pharmacy document the reason why and proceed with request.) Pt states that bottles says to call pcp  Preferred Pharmacy (with phone number or street name):  [facility] [place] - [address]  Phone:  [phone] Fax:  [phone] Has the patient been seen for an appointment in the last year OR does the patient have an upcoming appointment? Yes.   Pt has an appt schedule for 08/19/20  Agent: Please be advised that RX refills may take up to 3 business days. We ask that you follow-up with your pharmacy.

## 2021-08-05 ENCOUNTER — Other Ambulatory Visit: Payer: Self-pay | Admitting: Internal Medicine

## 2021-08-05 DIAGNOSIS — I120 Hypertensive chronic kidney disease with stage 5 chronic kidney disease or end stage renal disease: Secondary | ICD-10-CM

## 2021-08-05 DIAGNOSIS — I10 Essential (primary) hypertension: Secondary | ICD-10-CM

## 2021-08-05 NOTE — Telephone Encounter (Signed)
Pt called after checking with pharmacy for refill for Losartan / advised pt it can take up to 3 business days/ pt states she took her last pill this morning / please advise asap

## 2021-08-05 NOTE — Telephone Encounter (Signed)
Requested medications are due for refill today.  yes  Requested medications are on the active medications list.  yes  Last refill. 01/06/2021  Future visit scheduled.   yes  Notes to clinic.  Labs are expired.    Requested Prescriptions  Pending Prescriptions Disp Refills   losartan (COZAAR) 50 MG tablet [Pharmacy Med Name: Losartan Potassium 50 MG Oral Tablet] 90 tablet 0    Sig: Take 1 tablet by mouth once daily     Cardiovascular:  Angiotensin Receptor Blockers Failed - 08/05/2021 11:51 AM      Failed - Cr in normal range and within 180 days    Creat  Date Value Ref Range Status  04/25/2020 3.15 (H) 0.60 - 0.93 mg/dL Final    Comment:    For patients >51 years of age, the reference limit for Creatinine is approximately 13% higher for people identified as African-American. .    Creatinine, Urine  Date Value Ref Range Status  03/16/2018 166 mg/dL Final          Failed - K in normal range and within 180 days    Potassium  Date Value Ref Range Status  04/25/2020 4.1 3.5 - 5.3 mmol/L Final          Failed - Last BP in normal range    BP Readings from Last 1 Encounters:  01/06/21 (!) 164/90          Failed - Valid encounter within last 6 months    Recent Outpatient Visits           7 months ago Class 2 severe obesity due to excess calories with serious comorbidity and body mass index (BMI) of 35.0 to 35.9 in adult [facility])   [facility] [place], Coralie Keens, NP   1 year ago Essential hypertension   [facility], Lupita Raider, FNP   1 year ago Nausea   [facility], Lupita Raider, FNP   2 years ago CKD stage 5 secondary to hypertension [facility])   [facility], Devonne Doughty, DO   2 years ago Essential hypertension   [place], Jerrel Ivory, NP       Future Appointments             In 2 weeks Garnette Gunner, Coralie Keens, NP [facility], [place]   In 3 months  [facility], [place] - Patient is not pregnant      Signed Prescriptions Disp Refills   metoprolol succinate (TOPROL-XL) 100 MG 24 hr tablet 90 tablet 0    Sig: TAKE 1 TABLET BY MOUTH ONCE DAILY WITH OR IMMEDIATELY FOLLOWING A MEAL     Cardiovascular:  Beta Blockers Failed - 08/05/2021 11:51 AM      Failed - Last BP in normal range    BP Readings from Last 1 Encounters:  01/06/21 (!) 164/90          Failed - Valid encounter within last 6 months    Recent Outpatient Visits           7 months ago Class 2 severe obesity due to excess calories with serious comorbidity and body mass index (BMI) of 35.0 to 35.9 in adult [facility])   [facility] [place], Coralie Keens, NP   1 year ago Essential hypertension   [facility] [place], [place]  [place], FNP   1 year ago Nausea   [place], FNP   2 years ago CKD stage 5 secondary to hypertension [facility])   [place], DO   2 years ago Essential hypertension   [place], NP       Future Appointments             In 2 weeks Garnette Gunner, Coralie Keens, NP [place]   In 3 months  [facility], [place] in normal range    Pulse Readings from Last 1 Encounters:  01/06/21 71

## 2021-08-05 NOTE — Telephone Encounter (Signed)
Requested Prescriptions  Pending Prescriptions Disp Refills   metoprolol succinate (TOPROL-XL) 100 MG 24 hr tablet [Pharmacy Med Name: Metoprolol Succinate ER 100 MG Oral Tablet Extended Release 24 Hour] 90 tablet 0    Sig: TAKE 1 TABLET BY MOUTH ONCE DAILY WITH OR IMMEDIATELY FOLLOWING A MEAL     Cardiovascular:  Beta Blockers Failed - 08/05/2021 11:51 AM      Failed - Last BP in normal range    BP Readings from Last 1 Encounters:  01/06/21 (!) 164/90         Failed - Valid encounter within last 6 months    Recent Outpatient Visits          7 months ago Class 2 severe obesity due to excess calories with serious comorbidity and body mass index (BMI) of 35.0 to 35.9 in adult [facility])   [facility], Coralie Keens, NP   1 year ago Essential hypertension   [facility], Lupita Raider, FNP   1 year ago Nausea   [facility], Lupita Raider, FNP   2 years ago CKD stage 5 secondary to hypertension [facility])   [facility] Olin Hauser, DO   2 years ago Essential hypertension   [place], Jerrel Ivory, NP      Future Appointments            In 2 weeks Garnette Gunner, Coralie Keens, NP [facility], [place]   In 3 months  [facility], [facility]           Passed - Last Heart Rate in normal range    Pulse Readings from Last 1 Encounters:  01/06/21 71          losartan (COZAAR) 50 MG tablet [Pharmacy Med Name: Losartan Potassium 50 MG Oral Tablet] 90 tablet 0    Sig: Take 1 tablet by mouth once daily     Cardiovascular:  Angiotensin Receptor Blockers Failed - 08/05/2021 11:51 AM      Failed - Cr in normal range and within 180 days    Creat  Date Value Ref Range Status  04/25/2020 3.15 (H) 0.60 - 0.93 mg/dL Final    Comment:    For patients >37 years of age, the reference limit for Creatinine is approximately 13% higher for people identified as African-American. .     Creatinine, Urine  Date Value Ref Range Status  03/16/2018 166 mg/dL Final         Failed - K in normal range and within 180 days    Potassium  Date Value Ref Range Status  04/25/2020 4.1 3.5 - 5.3 mmol/L Final         Failed - Last BP in normal range    BP Readings from Last 1 Encounters:  01/06/21 (!) 164/90         Failed - Valid encounter within last 6 months    Recent Outpatient Visits          7 months ago Class 2 severe obesity due to excess calories with serious comorbidity and body mass index (BMI) of 35.0 to 35.9 in adult [facility])   [facility] [place], Coralie Keens, NP   1 year ago Essential hypertension   [place], FNP   1 year ago Nausea   [place], FNP   2 years ago CKD stage  5 secondary to hypertension [facility])   [place] Lee, DO   2 years ago Essential hypertension   [facility] [place] [place], Jerrel Ivory, NP      Future Appointments            In 2 weeks Garnette Gunner, Coralie Keens, NP [facility], [place]   In 3 months  [facility], [place] - Patient is not pregnant

## 2021-08-06 NOTE — Telephone Encounter (Signed)
I have already refilled this

## 2021-08-19 ENCOUNTER — Ambulatory Visit: Payer: Medicare Other | Admitting: Internal Medicine

## 2021-09-03 ENCOUNTER — Encounter: Payer: Self-pay | Admitting: Internal Medicine

## 2021-09-03 ENCOUNTER — Other Ambulatory Visit: Payer: Self-pay

## 2021-09-03 ENCOUNTER — Ambulatory Visit (INDEPENDENT_AMBULATORY_CARE_PROVIDER_SITE_OTHER): Payer: Medicare Other | Admitting: Internal Medicine

## 2021-09-03 VITALS — BP 139/80 | HR 76 | Ht 62.0 in | Wt 189.0 lb

## 2021-09-03 DIAGNOSIS — Z6829 Body mass index (BMI) 29.0-29.9, adult: Secondary | ICD-10-CM | POA: Insufficient documentation

## 2021-09-03 DIAGNOSIS — Z0001 Encounter for general adult medical examination with abnormal findings: Secondary | ICD-10-CM | POA: Diagnosis not present

## 2021-09-03 DIAGNOSIS — I10 Essential (primary) hypertension: Secondary | ICD-10-CM | POA: Diagnosis not present

## 2021-09-03 DIAGNOSIS — Z23 Encounter for immunization: Secondary | ICD-10-CM

## 2021-09-03 DIAGNOSIS — E663 Overweight: Secondary | ICD-10-CM | POA: Insufficient documentation

## 2021-09-03 DIAGNOSIS — K219 Gastro-esophageal reflux disease without esophagitis: Secondary | ICD-10-CM | POA: Diagnosis not present

## 2021-09-03 DIAGNOSIS — E6609 Other obesity due to excess calories: Secondary | ICD-10-CM | POA: Insufficient documentation

## 2021-09-03 DIAGNOSIS — E66811 Obesity, class 1: Secondary | ICD-10-CM

## 2021-09-03 DIAGNOSIS — N185 Chronic kidney disease, stage 5: Secondary | ICD-10-CM | POA: Diagnosis not present

## 2021-09-03 DIAGNOSIS — Z6834 Body mass index (BMI) 34.0-34.9, adult: Secondary | ICD-10-CM | POA: Diagnosis not present

## 2021-09-03 DIAGNOSIS — E785 Hyperlipidemia, unspecified: Secondary | ICD-10-CM | POA: Diagnosis not present

## 2021-09-03 LAB — LIPID PANEL
Cholesterol: 155 mg/dL (ref <200)
HDL: 62 mg/dL (ref >=50)
LDL Cholesterol (Calc): 78 mg/dL (calc)
Non-HDL Cholesterol (Calc): 93 mg/dL (calc) (ref <130)
Total CHOL/HDL Ratio: 2.5 (calc) (ref <5.0)
Triglycerides: 69 mg/dL (ref <150)

## 2021-09-03 LAB — COMPLETE METABOLIC PANEL WITHOUT GFR
AG Ratio: 1.3 (calc) (ref 1.0–2.5)
ALT: 7 U/L (ref 6–29)
AST: 13 U/L (ref 10–35)
Albumin: 4.5 g/dL (ref 3.6–5.1)
Alkaline phosphatase (APISO): 62 U/L (ref 37–153)
BUN/Creatinine Ratio: 13 (calc) (ref 6–22)
BUN: 56 mg/dL — ABNORMAL HIGH (ref 7–25)
CO2: 19 mmol/L — ABNORMAL LOW (ref 20–32)
Calcium: 9.8 mg/dL (ref 8.6–10.4)
Chloride: 106 mmol/L (ref 98–110)
Creat: 4.41 mg/dL — ABNORMAL HIGH (ref 0.60–1.00)
Globulin: 3.5 g/dL (ref 1.9–3.7)
Glucose, Bld: 100 mg/dL (ref 65–139)
Potassium: 4.6 mmol/L (ref 3.5–5.3)
Sodium: 136 mmol/L (ref 135–146)
Total Bilirubin: 0.3 mg/dL (ref 0.2–1.2)
Total Protein: 8 g/dL (ref 6.1–8.1)
eGFR: 10 mL/min/1.73m2 — ABNORMAL LOW

## 2021-09-03 LAB — CBC
HCT: 34.1 % — ABNORMAL LOW (ref 35.0–45.0)
Hemoglobin: 10.9 g/dL — ABNORMAL LOW (ref 11.7–15.5)
MCH: 30.8 pg (ref 27.0–33.0)
MCHC: 32 g/dL (ref 32.0–36.0)
MCV: 96.3 fL (ref 80.0–100.0)
MPV: 11 fL (ref 7.5–12.5)
Platelets: 285 Thousand/uL (ref 140–400)
RBC: 3.54 Million/uL — ABNORMAL LOW (ref 3.80–5.10)
RDW: 12.2 % (ref 11.0–15.0)
WBC: 11 Thousand/uL — ABNORMAL HIGH (ref 3.8–10.8)

## 2021-09-03 MED ORDER — LEVOCETIRIZINE DIHYDROCHLORIDE 5 MG PO TABS: 5.0000 mg | ORAL_TABLET | ORAL | 1 refills | Status: DC

## 2021-09-03 MED ORDER — AMLODIPINE BESYLATE 10 MG PO TABS: 10.0000 mg | ORAL_TABLET | Freq: Every day | ORAL | 1 refills | Status: DC

## 2021-09-03 MED ORDER — CALCITRIOL 0.5 MCG PO CAPS: 0.5000 ug | ORAL_CAPSULE | Freq: Every day | ORAL | 1 refills | Status: DC

## 2021-09-03 MED ORDER — ATORVASTATIN CALCIUM 20 MG PO TABS: 20.0000 mg | ORAL_TABLET | Freq: Every day | ORAL | 1 refills | Status: DC

## 2021-09-03 MED ORDER — LOSARTAN POTASSIUM 50 MG PO TABS: 50.0000 mg | ORAL_TABLET | Freq: Every day | ORAL | 1 refills | Status: DC

## 2021-09-03 MED ORDER — OMEPRAZOLE 20 MG PO CPDR: 20.0000 mg | DELAYED_RELEASE_CAPSULE | Freq: Every day | ORAL | 1 refills | Status: DC

## 2021-09-03 MED ORDER — METOPROLOL SUCCINATE ER 100 MG PO TB24: 100.0000 mg | ORAL_TABLET | Freq: Every day | ORAL | 1 refills | Status: DC

## 2021-09-03 NOTE — Assessment & Plan Note (Signed)
Encourage diet and exercise for weight loss 

## 2021-09-03 NOTE — Patient Instructions (Signed)
Health Maintenance for Postmenopausal Women ?Menopause is a normal process in which your ability to get pregnant comes to an end. This process happens slowly over many months or years, usually between the ages of 48 and 55. Menopause is complete when you have missed your menstrual period for 12 months. ?It is important to talk with your health care provider about some of the most common conditions that affect women after menopause (postmenopausal women). These include heart disease, cancer, and bone loss (osteoporosis). Adopting a healthy lifestyle and getting preventive care can help to promote your health and wellness. The actions you take can also lower your chances of developing some of these common conditions. ?What are the signs and symptoms of menopause? ?During menopause, you may have the following symptoms: ?Hot flashes. These can be moderate or severe. ?Night sweats. ?Decrease in sex drive. ?Mood swings. ?Headaches. ?Tiredness (fatigue). ?Irritability. ?Memory problems. ?Problems falling asleep or staying asleep. ?Talk with your health care provider about treatment options for your symptoms. ?Do I need hormone replacement therapy? ?Hormone replacement therapy is effective in treating symptoms that are caused by menopause, such as hot flashes and night sweats. ?Hormone replacement carries certain risks, especially as you become older. If you are thinking about using estrogen or estrogen with progestin, discuss the benefits and risks with your health care provider. ?How can I reduce my risk for heart disease and stroke? ?The risk of heart disease, heart attack, and stroke increases as you age. One of the causes may be a change in the body's hormones during menopause. This can affect how your body uses dietary fats, triglycerides, and cholesterol. Heart attack and stroke are medical emergencies. There are many things that you can do to help prevent heart disease and stroke. ?Watch your blood pressure ?High  blood pressure causes heart disease and increases the risk of stroke. This is more likely to develop in people who have high blood pressure readings or are overweight. ?Have your blood pressure checked: ?Every 3-5 years if you are 18-39 years of age. ?Every year if you are 40 years old or older. ?Eat a healthy diet ? ?Eat a diet that includes plenty of vegetables, fruits, low-fat dairy products, and lean protein. ?Do not eat a lot of foods that are high in solid fats, added sugars, or sodium. ?Get regular exercise ?Get regular exercise. This is one of the most important things you can do for your health. Most adults should: ?Try to exercise for at least 150 minutes each week. The exercise should increase your heart rate and make you sweat (moderate-intensity exercise). ?Try to do strengthening exercises at least twice each week. Do these in addition to the moderate-intensity exercise. ?Spend less time sitting. Even light physical activity can be beneficial. ?Other tips ?Work with your health care provider to achieve or maintain a healthy weight. ?Do not use any products that contain nicotine or tobacco. These products include cigarettes, chewing tobacco, and vaping devices, such as e-cigarettes. If you need help quitting, ask your health care provider. ?Know your numbers. Ask your health care provider to check your cholesterol and your blood sugar (glucose). Continue to have your blood tested as directed by your health care provider. ?Do I need screening for cancer? ?Depending on your health history and family history, you may need to have cancer screenings at different stages of your life. This may include screening for: ?Breast cancer. ?Cervical cancer. ?Lung cancer. ?Colorectal cancer. ?What is my risk for osteoporosis? ?After menopause, you may be   at increased risk for osteoporosis. Osteoporosis is a condition in which bone destruction happens more quickly than new bone creation. To help prevent osteoporosis or  the bone fractures that can happen because of osteoporosis, you may take the following actions: ?If you are 19-50 years old, get at least 1,000 mg of calcium and at least 600 international units (IU) of vitamin D per day. ?If you are older than age 50 but younger than age 70, get at least 1,200 mg of calcium and at least 600 international units (IU) of vitamin D per day. ?If you are older than age 70, get at least 1,200 mg of calcium and at least 800 international units (IU) of vitamin D per day. ?Smoking and drinking excessive alcohol increase the risk of osteoporosis. Eat foods that are rich in calcium and vitamin D, and do weight-bearing exercises several times each week as directed by your health care provider. ?How does menopause affect my mental health? ?Depression may occur at any age, but it is more common as you become older. Common symptoms of depression include: ?Feeling depressed. ?Changes in sleep patterns. ?Changes in appetite or eating patterns. ?Feeling an overall lack of motivation or enjoyment of activities that you previously enjoyed. ?Frequent crying spells. ?Talk with your health care provider if you think that you are experiencing any of these symptoms. ?General instructions ?See your health care provider for regular wellness exams and vaccines. This may include: ?Scheduling regular health, dental, and eye exams. ?Getting and maintaining your vaccines. These include: ?Influenza vaccine. Get this vaccine each year before the flu season begins. ?Pneumonia vaccine. ?Shingles vaccine. ?Tetanus, diphtheria, and pertussis (Tdap) booster vaccine. ?Your health care provider may also recommend other immunizations. ?Tell your health care provider if you have ever been abused or do not feel safe at home. ?Summary ?Menopause is a normal process in which your ability to get pregnant comes to an end. ?This condition causes hot flashes, night sweats, decreased interest in sex, mood swings, headaches, or lack  of sleep. ?Treatment for this condition may include hormone replacement therapy. ?Take actions to keep yourself healthy, including exercising regularly, eating a healthy diet, watching your weight, and checking your blood pressure and blood sugar levels. ?Get screened for cancer and depression. Make sure that you are up to date with all your vaccines. ?This information is not intended to replace advice given to you by your health care provider. Make sure you discuss any questions you have with your health care provider. ?Document Revised: 12/23/2020 Document Reviewed: 12/23/2020 ?Elsevier Patient Education ? 2022 [employer] ? ?

## 2021-09-03 NOTE — Progress Notes (Signed)
Subjective:    Patient ID: Destiny Adams, female    DOB: 03-01-1951, 71 y.o.   MRN: [id number]  HPI  Pt presents to the clinic today for her annual exam.  Flu: never Tetanus: unsure Pneumovax: never Prevnar: 04/2020 Covid: Moderna x3 Shingrix: never Pap smear: no longer screening Mammogram: no longer screening Bone density: never Colon screening: 07/2016 Vision screening: annually Dentist: biannually  Diet: She does eat meat. She consumes fruits and veggies. She does eat some fried foods. She drinks mostly coffee and juice. Exercise: None  Review of Systems  Past Medical History:  Diagnosis Date   Anemia    CKD (chronic kidney disease) stage 4, GFR 15-29 ml/min ([facility]) 07/23/2017   GERD (gastroesophageal reflux disease)    Hyperlipidemia    Hypertension    Severe obesity (BMI 35.0-35.9 with comorbidity) ([facility]) 07/23/2017   Comorbid CKD stage IV, Hypertension, Hyperlipidemia    Current Outpatient Medications  Medication Sig Dispense Refill   acetaminophen (TYLENOL) 650 MG CR tablet Take 650 mg by mouth every 8 (eight) hours as needed for pain.     amLODipine (NORVASC) 10 MG tablet Take 1 tablet (10 mg total) by mouth daily. 90 tablet 1   atorvastatin (LIPITOR) 10 MG tablet Take 2 tablets (20 mg total) by mouth daily. 180 tablet 1   calcitRIOL (ROCALTROL) 0.5 MCG capsule Take 1 capsule (0.5 mcg total) by mouth daily. 90 capsule 1   levocetirizine (XYZAL) 5 MG tablet TAKE 1 TABLET DAILY ON MONDAY, WEDNESDAY AND FRIDAY  (DOSE CHANGED DUE TO RENAL FUNCTION AT LAST CHECK) 39 tablet 1   losartan (COZAAR) 50 MG tablet Take 1 tablet by mouth once daily 90 tablet 0   metoprolol succinate (TOPROL-XL) 100 MG 24 hr tablet TAKE 1 TABLET BY MOUTH ONCE DAILY WITH OR IMMEDIATELY FOLLOWING A MEAL 90 tablet 0   omeprazole (PRILOSEC) 20 MG capsule TAKE 1 CAPSULE EVERY DAY 90 capsule 1   SODIUM BICARBONATE PO Take 10 g by mouth.     No current facility-administered medications for this  visit.    No Known Allergies  Family History  Problem Relation Age of Onset   Colon cancer Maternal Uncle    Colon cancer Paternal Uncle    Breast cancer Paternal Aunt        55   Alzheimer's disease Mother    Cancer Father     Social History   Socioeconomic History   Marital status: Divorced    Spouse name: Not on file   Number of children: Not on file   Years of education: Not on file   Highest education level: Not on file  Occupational History   Occupation: retired  Tobacco Use   Smoking status: Never   Smokeless tobacco: Never  Vaping Use   Vaping Use: Never used  Substance and Sexual Activity   Alcohol use: No   Drug use: No   Sexual activity: Not on file  Other Topics Concern   Not on file  Social History Narrative   Not on file   Social Determinants of Health   Financial Resource Strain: Low Risk    Difficulty of Paying Living Expenses: Not hard at all  Food Insecurity: No Food Insecurity   Worried About Charity fundraiser in the Last Year: Never true   [place] in the Last Year: Never true  Transportation Needs: No Transportation Needs   Lack of Transportation (Medical): No   Lack of Transportation (Non-Medical):  No  Physical Activity: Insufficiently Active   Days of Exercise per Week: 3 days   Minutes of Exercise per Session: 30 min  Stress: No Stress Concern Present   Feeling of Stress : Not at all  Social Connections: Not on file  Intimate Partner Violence: Not on file     Constitutional: Denies fever, malaise, fatigue, headache or abrupt weight changes.  HEENT: Denies eye pain, eye redness, ear pain, ringing in the ears, wax buildup, runny nose, nasal congestion, bloody nose, or sore throat. Respiratory: Denies difficulty breathing, shortness of breath, cough or sputum production.   Cardiovascular: Denies chest pain, chest tightness, palpitations or swelling in the hands or feet.  Gastrointestinal: Denies abdominal pain, bloating,  constipation, diarrhea or blood in the stool.  GU: Denies urgency, frequency, pain with urination, burning sensation, blood in urine, odor or discharge. Musculoskeletal: Denies decrease in range of motion, difficulty with gait, muscle pain or joint pain and swelling.  Skin: Denies redness, rashes, lesions or ulcercations.  Neurological: Denies dizziness, difficulty with memory, difficulty with speech or problems with balance and coordination.  Psych: Denies anxiety, depression, SI/HI.  No other specific complaints in a complete review of systems (except as listed in HPI above).     Objective:   Physical Exam BP 139/80    Pulse 76    Ht 5\' 2"  (1.575 m)    Wt 189 lb (85.7 kg)    SpO2 100%    BMI 34.57 kg/m   Wt Readings from Last 3 Encounters:  01/06/21 195 lb 12.8 oz (88.8 kg)  10/29/20 185 lb (83.9 kg)  04/25/20 206 lb (93.4 kg)    General: Appears her stated age, obese, in NAD. Skin: Warm, dry and intact. No ulcerations noted. HEENT: Head: normal shape and size; Eyes: sclera white and EOMs intact;  Neck:  Neck supple, trachea midline. No masses, lumps or thyromegaly present.  Cardiovascular: Normal rate and rhythm. S1,S2 noted.  No murmur, rubs or gallops noted. No JVD or BLE edema. No carotid bruits noted. Pulmonary/Chest: Normal effort and positive vesicular breath sounds. No respiratory distress. No wheezes, rales or ronchi noted.  Abdomen: Soft and nontender. Normal bowel sounds.  Musculoskeletal: Strength 5/5 BUE/BLE. No difficulty with gait.  Neurological: Alert and oriented. Cranial nerves II-XII grossly intact. Coordination normal.  Psychiatric: Mood and affect normal. Behavior is normal. Judgment and thought content normal.    BMET    Component Value Date/Time   NA 137 04/25/2020 0957   K 4.1 04/25/2020 0957   CL 104 04/25/2020 0957   CO2 20 04/25/2020 0957   GLUCOSE 112 (H) 04/25/2020 0957   BUN 45 (H) 04/25/2020 0957   CREATININE 3.15 (H) 04/25/2020 0957    CALCIUM 9.8 04/25/2020 0957   GFRNONAA 14 (L) 04/25/2020 0957   GFRAA 17 (L) 04/25/2020 0957    Lipid Panel     Component Value Date/Time   CHOL 154 04/25/2020 0957   TRIG 122 04/25/2020 0957   HDL 54 04/25/2020 0957   CHOLHDL 2.9 04/25/2020 0957   LDLCALC 78 04/25/2020 0957    CBC    Component Value Date/Time   WBC 11.0 (H) 05/15/2020 0838   RBC 3.59 (L) 05/15/2020 0838   HGB 11.1 (L) 05/15/2020 0838   HCT 34.8 (L) 05/15/2020 0838   PLT 227 05/15/2020 0838   MCV 96.9 05/15/2020 0838   MCH 30.9 05/15/2020 0838   MCHC 31.9 (L) 05/15/2020 0838   RDW 12.1 05/15/2020 0838   LYMPHSABS  4,015 (H) 05/15/2020 0838   MONOABS 1.1 (H) 03/16/2018 0801   EOSABS 946 (H) 05/15/2020 0838   BASOSABS 66 05/15/2020 0838    Hgb A1C Lab Results  Component Value Date   HGBA1C 5.1 04/25/2020            Assessment & Plan:   Preventative Health Maintenance:  She declines flu shot Advised her if she gets bit or cut she will need to go get a tetanus vaccine Pneumovax today Prevnar UTD Discussed Shingrix vaccine, if she would like to get this she will get this done at pharmacy She no longer wants to screen for cervical cancer She declines mammogram and bone density at this time Colon screening UTD Encouraged her to consume a balanced diet and exercise regimen Advised her to see an eye doctor and dentist annually Will check CBC, CMET, Lipid profile today  RTC in 6 months for follow up chronic conditions  Webb Silversmith, NP This visit occurred during the SARS-CoV-2 public health emergency.  Safety protocols were in place, including screening questions prior to the visit, additional usage of staff PPE, and extensive cleaning of exam room while observing appropriate contact time as indicated for disinfecting solutions.

## 2021-11-04 ENCOUNTER — Ambulatory Visit: Payer: Medicare Other

## 2022-01-05 ENCOUNTER — Ambulatory Visit (INDEPENDENT_AMBULATORY_CARE_PROVIDER_SITE_OTHER): Payer: Medicare Other

## 2022-01-05 DIAGNOSIS — Z0000 Encounter for general adult medical examination without abnormal findings: Secondary | ICD-10-CM

## 2022-01-05 NOTE — Patient Instructions (Signed)

## 2022-01-05 NOTE — Progress Notes (Signed)
Subjective:  I connected with  Destiny Adams on 01/05/22 by a audio enabled telemedicine application and verified that I am speaking with the correct person using two identifiers.  Patient Location: Home  Provider Location: Office/Clinic  I discussed the limitations of evaluation and management by telemedicine. The patient expressed understanding and agreed to proceed.    Destiny Adams is a 71 y.o. female who presents for Medicare Annual (Subsequent) preventive examination.  Review of Systems    Per HPI unless specifically indicated below.        Objective:    There were no vitals filed for this visit. There is no height or weight on file to calculate BMI.  Wt Readings from Last 3 Encounters:  09/03/21 189 lb (85.7 kg)  01/06/21 195 lb 12.8 oz (88.8 kg)  10/29/20 185 lb (83.9 kg)   Temp Readings from Last 3 Encounters:  01/06/21 (!) 97.1 F (36.2 C) (Temporal)  04/25/20 98.1 F (36.7 C) (Oral)  04/20/19 98.4 F (36.9 C) (Oral)   BP Readings from Last 3 Encounters:  09/03/21 139/80  01/06/21 (!) 164/90  04/25/20 (!) 193/101   Pulse Readings from Last 3 Encounters:  09/03/21 76  01/06/21 71  04/25/20 85    Wt Readings from Last 3 Encounters:  09/03/21 189 lb (85.7 kg)  01/06/21 195 lb 12.8 oz (88.8 kg)  10/29/20 185 lb (83.9 kg)   Temp Readings from Last 3 Encounters:  01/06/21 (!) 97.1 F (36.2 C) (Temporal)  04/25/20 98.1 F (36.7 C) (Oral)  04/20/19 98.4 F (36.9 C) (Oral)   BP Readings from Last 3 Encounters:  09/03/21 139/80  01/06/21 (!) 164/90  04/25/20 (!) 193/101   Pulse Readings from Last 3 Encounters:  09/03/21 76  01/06/21 71  04/25/20 85        10/29/2020    9:04 AM 10/17/2019    1:04 PM 03/16/2018    8:33 AM 12/14/2017   10:40 AM 07/29/2016    9:57 AM  Advanced Directives  Does Patient Have a Medical Advance Directive? No No No No No  Would patient like information on creating a medical advance directive?   No - Patient  declined Yes (MAU/Ambulatory/Procedural Areas - Information given) No - Patient declined    Current Medications (verified) Outpatient Encounter Medications as of 01/05/2022  Medication Sig   acetaminophen (TYLENOL) 650 MG CR tablet Take 650 mg by mouth every 8 (eight) hours as needed for pain.   amLODipine (NORVASC) 10 MG tablet Take 1 tablet (10 mg total) by mouth daily.   atorvastatin (LIPITOR) 20 MG tablet Take 1 tablet (20 mg total) by mouth daily.   calcitRIOL (ROCALTROL) 0.5 MCG capsule Take 1 capsule (0.5 mcg total) by mouth daily.   levocetirizine (XYZAL) 5 MG tablet Take 1 tablet (5 mg total) by mouth 3 (three) times a week.   losartan (COZAAR) 50 MG tablet Take 1 tablet (50 mg total) by mouth daily.   metoprolol succinate (TOPROL-XL) 100 MG 24 hr tablet Take 1 tablet (100 mg total) by mouth daily. Take with or immediately following a meal.   omeprazole (PRILOSEC) 20 MG capsule Take 1 capsule (20 mg total) by mouth daily.   No facility-administered encounter medications on file as of 01/05/2022.    Allergies (verified) Patient has no known allergies.   History: Past Medical History:  Diagnosis Date   Anemia    CKD (chronic kidney disease) stage 4, GFR 15-29 ml/min (HCC) 07/23/2017   GERD (  gastroesophageal reflux disease)    Hyperlipidemia    Hypertension    Severe obesity (BMI 35.0-35.9 with comorbidity) ([place]) 07/23/2017   Comorbid CKD stage IV, Hypertension, Hyperlipidemia   Past Surgical History:  Procedure Laterality Date   COLONOSCOPY WITH PROPOFOL N/A 07/29/2016   Procedure: COLONOSCOPY WITH PROPOFOL;  Surgeon: Robert Bellow, MD;  Location: [facility] ENDOSCOPY;  Service: Endoscopy;  Laterality: N/A;   TUBAL LIGATION     Family History  Problem Relation Age of Onset   Colon cancer Maternal Uncle    Colon cancer Paternal Uncle    Breast cancer Paternal Aunt        3   Alzheimer's disease Mother    Cancer Father    Social History   Socioeconomic History    Marital status: Divorced    Spouse name: Not on file   Number of children: Not on file   Years of education: Not on file   Highest education level: Not on file  Occupational History   Occupation: retired  Tobacco Use   Smoking status: Never   Smokeless tobacco: Never  Vaping Use   Vaping Use: Never used  Substance and Sexual Activity   Alcohol use: Not Currently   Drug use: Never   Sexual activity: Yes    Partners: Male  Other Topics Concern   Not on file  Social History Narrative   Not on file   Social Determinants of Health   Financial Resource Strain: Low Risk    Difficulty of Paying Living Expenses: Not hard at all  Food Insecurity: No Food Insecurity   Worried About Charity fundraiser in the Last Year: Never true   [place] in the Last Year: Never true  Transportation Needs: No Transportation Needs   Lack of Transportation (Medical): No   Lack of Transportation (Non-Medical): No  Physical Activity: Not on file  Stress: No Stress Concern Present   Feeling of Stress : Not at all  Social Connections: Moderately Integrated   Frequency of Communication with Friends and Family: More than three times a week   Frequency of Social Gatherings with Friends and Family: More than three times a week   Attends Religious Services: More than 4 times per year   Active Member of [employer] or Organizations: Yes   Attends Music therapist: More than 4 times per year   Marital Status: Divorced    Tobacco Counseling Counseling given: Not Answered   Clinical Intake:  Pre-visit preparation completed: No  Pain : No/denies pain     Nutritional Status: BMI > 30  Obese Nutritional Risks: None Diabetes: No  She does eat meat. She consumes fruits and veggies. She does eat some fried foods. She drinks mostly coffee and juice. How often do you need to have someone help you when you read instructions, pamphlets, or other written materials from your doctor or  pharmacy?: 1 - Never  Diabetic?No   Interpreter Needed?: No      Activities of Daily Living    01/05/2022    3:21 PM 09/03/2021   10:40 AM  In your present state of health, do you have any difficulty performing the following activities:  Hearing? 0 0  Vision? 1 0  Difficulty concentrating or making decisions? 0 0  Walking or climbing stairs? 0 0  Dressing or bathing? 0 0  Doing errands, shopping? 0 0    Patient Care Team: Jearld Fenton, NP as PCP - General (Internal Medicine)  Robert Bellow, MD (General Surgery)  Indicate any recent Medical Services you may have received from other than [facility] providers in the past year (date may be approximate). No hospitalization in the past 12 months.     Assessment:   This is a routine wellness examination for Destiny Adams.  Hearing/Vision screen No results found.  Dietary issues and exercise activities discussed: Current Exercise Habits: Home exercise routine, Time (Minutes): 30, Frequency (Times/Week): 7, Weekly Exercise (Minutes/Week): 210, Intensity: Mild   Goals Addressed   None    Depression Screen    01/05/2022    3:21 PM 09/03/2021   10:39 AM 10/29/2020    9:06 AM 10/17/2019    1:08 PM 07/19/2019    9:17 AM 04/20/2019   10:01 AM 12/14/2017   10:04 AM  PHQ 2/9 Scores  PHQ - 2 Score 0 0 0 0 0 0 0  PHQ- 9 Score  0    0 4    Fall Risk    01/05/2022    3:20 PM 09/03/2021   10:39 AM 10/29/2020    9:06 AM 10/17/2019    1:06 PM 07/19/2019    9:17 AM  [place] in the past year? 0 0 0 0 0  Number falls in past yr: 0 0  0 0  Injury with Fall? 0 0  0   Risk for fall due to : No Fall Risks No Fall Risks Medication side effect    Follow up Falls evaluation completed Falls evaluation completed Falls evaluation completed;Education provided;Falls prevention discussed  Falls evaluation completed    FALL RISK PREVENTION PERTAINING TO THE HOME:  Any stairs in or around the home? No  If so, are there any without handrails?   Does not apply  Home free of loose throw rugs in walkways, pet beds, electrical cords, etc? Yes  Adequate lighting in your home to reduce risk of falls? Yes   ASSISTIVE DEVICES UTILIZED TO PREVENT FALLS:  Life alert? No  Use of a cane, walker or w/c? No  Grab bars in the bathroom? No  Shower chair or bench in shower? No  Elevated toilet seat or a handicapped toilet? No    Cognitive Function:        01/05/2022    3:25 PM 10/29/2020    9:08 AM 12/14/2017   10:37 AM  6CIT Screen  What Year? 0 points 0 points 0 points  What month? 0 points 0 points 0 points  What time? 0 points 0 points 0 points  Count back from 20 0 points 0 points 0 points  Months in reverse 0 points 0 points 0 points  Repeat phrase 4 points 0 points 0 points  Total Score 4 points 0 points 0 points    Immunizations Immunization History  Administered Date(s) Administered   Hepatitis A, Adult 04/26/2018   Hepatitis B, adult 04/26/2018, 05/27/2018, 04/26/2019   Moderna Sars-Covid-2 Vaccination 09/27/2019, 10/18/2019, 06/10/2020   PNEUMOCOCCAL CONJUGATE-20 09/03/2021   Pneumococcal Conjugate-13 12/14/2017, 04/25/2020    TDAP status: Due, Education has been provided regarding the importance of this vaccine. Advised may receive this vaccine at local pharmacy or Health Dept. Aware to provide a copy of the vaccination record if obtained from local pharmacy or Health Dept. Verbalized acceptance and understanding.  Flu Vaccine status: Up to date  Pneumococcal vaccine status: Up to date  Covid-19 vaccine status: Completed vaccines  Qualifies for Shingles Vaccine? Yes   Zostavax completed No   Shingrix Completed?:  No.    Education has been provided regarding the importance of this vaccine. Patient has been advised to call insurance company to determine out of pocket expense if they have not yet received this vaccine. Advised may also receive vaccine at local pharmacy or Health Dept. Verbalized acceptance and  understanding.  Screening Tests Health Maintenance  Topic Date Due   Zoster Vaccines- Shingrix (1 of 2) Never done   COVID-19 Vaccine (4 - Booster) 08/05/2020   MAMMOGRAM  09/03/2022 (Originally 11/23/1999)   DEXA SCAN  09/03/2022 (Originally 11/23/2014)   TETANUS/TDAP  09/03/2022 (Originally 11/22/1968)   INFLUENZA VACCINE  03/17/2022   COLONOSCOPY (Pts 45-56yrs Insurance coverage will need to be confirmed)  07/29/2026   Pneumonia Vaccine 9+ Years old  Completed   Hepatitis C Screening  Completed   HPV VACCINES  Aged Out    Health Maintenance  Health Maintenance Due  Topic Date Due   Zoster Vaccines- Shingrix (1 of 2) Never done   COVID-19 Vaccine (4 - Booster) 08/05/2020    Colorectal cancer screening: Type of screening: Colonoscopy. Completed 07/29/2016. Repeat every 10 years  Mammogram status: No longer required due to pt decline .  DEXA scan: Pt decline   Lung Cancer Screening: (Low Dose CT Chest recommended if Age 14-80 years, 30 pack-year currently smoking OR have quit w/in 15years.) does not qualify.   Lung Cancer Screening Referral: doesn't qualify   Additional Screening:  Hepatitis C Screening: does qualify; Completed 03/22/2018   Vision Screening: Recommended annual ophthalmology exams for early detection of glaucoma and other disorders of the eye. Is the patient up to date with their annual eye exam?  Yes  Who is the provider or what is the name of the office in which the patient attends annual eye exams? Dr. Gloriann Loan  If pt is not established with a provider, would they like to be referred to a provider to establish care? No .   Dental Screening: Recommended annual dental exams for proper oral hygiene  Community Resource Referral / Chronic Care Management: CRR required this visit?  No   CCM required this visit?  No      Plan:     I have personally reviewed and noted the following in the patient's chart:   Medical and social history Use of alcohol,  tobacco or illicit drugs  Current medications and supplements including opioid prescriptions.  Functional ability and status Nutritional status Physical activity Advanced directives List of other physicians Hospitalizations, surgeries, and ER visits in previous 12 months Vitals Screenings to include cognitive, depression, and falls Referrals and appointments  In addition, I have reviewed and discussed with patient certain preventive protocols, quality metrics, and best practice recommendations. A written personalized care plan for preventive services as well as general preventive health recommendations were provided to patient.     Wilson Singer, [place]   01/05/2022    Ms. Ennis , Thank you for taking time to come for your Medicare Wellness Visit. I appreciate your ongoing commitment to your health goals. Please review the following plan we discussed and let me know if I can assist you in the future.   These are the goals we discussed:  Goals      DIET - INCREASE WATER INTAKE     Recommend drinking at least 6-8 glasses of water a day       Patient Stated     10/29/2020, wants to weigh 160 pounds        This is  a list of the screening recommended for you and due dates:  Health Maintenance  Topic Date Due   Zoster (Shingles) Vaccine (1 of 2) Never done   COVID-19 Vaccine (4 - Booster) 08/05/2020   Mammogram  09/03/2022*   DEXA scan (bone density measurement)  09/03/2022*   Tetanus Vaccine  09/03/2022*   Flu Shot  03/17/2022   Colon Cancer Screening  07/29/2026   Pneumonia Vaccine  Completed   Hepatitis C Screening: USPSTF Recommendation to screen - Ages 18-79 yo.  Completed   HPV Vaccine  Aged Out  *Topic was postponed. The date shown is not the original due date.

## 2022-04-01 DIAGNOSIS — H35033 Hypertensive retinopathy, bilateral: Secondary | ICD-10-CM | POA: Diagnosis not present

## 2022-04-01 DIAGNOSIS — H5203 Hypermetropia, bilateral: Secondary | ICD-10-CM | POA: Diagnosis not present

## 2022-04-22 ENCOUNTER — Other Ambulatory Visit: Payer: Self-pay | Admitting: Internal Medicine

## 2022-04-22 DIAGNOSIS — Z0001 Encounter for general adult medical examination with abnormal findings: Secondary | ICD-10-CM

## 2022-04-23 NOTE — Telephone Encounter (Signed)
  Requested medication (s) are due for refill today: yes  Requested medication (s) are on the active medication list: yes  Last refill:  09/03/21 #90 with 0 RF  Future visit scheduled: no, seen 09/03/2021  Notes to clinic:  Failed protocol of labs within 12 months, no upcoming appt, please assess.     Requested Prescriptions  Pending Prescriptions Disp Refills   calcitRIOL (ROCALTROL) 0.5 MCG capsule [Pharmacy Med Name: Calcitriol 0.5 MCG Oral Capsule] 90 capsule 0    Sig: Take 1 capsule by mouth once daily     Endocrinology:  Vitamins - Vitamin D Supplementation - calcitriol Failed - 04/22/2022 10:48 AM      Failed - Phosphate in normal range and within 360 days    No results found for: "PHOS"       Failed - PTH in normal range and within 360 days    No results found for: "IOPTH", "PTHINTACTFNA", "PTH"       Passed - Ca in normal range and within 360 days    Calcium  Date Value Ref Range Status  09/03/2021 9.8 8.6 - 10.4 mg/dL Final         Passed - Valid encounter within last 12 months    Recent Outpatient Visits           7 months ago Encounter for general adult medical examination with abnormal findings   [facility] [place], Coralie Keens, NP   1 year ago Class 2 severe obesity due to excess calories with serious comorbidity and body mass index (BMI) of 35.0 to 35.9 in adult [facility])   [facility] [place], Coralie Keens, NP   1 year ago Essential hypertension   [facility], Lupita Raider, FNP   2 years ago Nausea   [facility] [place], Lupita Raider, FNP   2 years ago CKD stage 5 secondary to hypertension [facility])   [facility], Devonne Doughty, DO

## 2022-04-24 ENCOUNTER — Other Ambulatory Visit: Payer: Self-pay | Admitting: Internal Medicine

## 2022-04-24 DIAGNOSIS — Z0001 Encounter for general adult medical examination with abnormal findings: Secondary | ICD-10-CM

## 2022-04-28 NOTE — Telephone Encounter (Signed)
Unable to refill per protocol, last refill by  Provider 04/23/22 for 90 days.Receipt confirmed by pharmacy (04/23/2022 12:24 PM). Will refuse duplicate request.  Requested Prescriptions  Pending Prescriptions Disp Refills  . calcitRIOL (ROCALTROL) 0.5 MCG capsule [Pharmacy Med Name: Calcitriol 0.5 MCG Oral Capsule] 90 capsule 0    Sig: Take 1 capsule by mouth once daily     Endocrinology:  Vitamins - Vitamin D Supplementation - calcitriol Failed - 04/24/2022  4:32 PM      Failed - Phosphate in normal range and within 360 days    No results found for: "PHOS"       Failed - PTH in normal range and within 360 days    No results found for: "IOPTH", "PTHINTACTFNA", "PTH"       Passed - Ca in normal range and within 360 days    Calcium  Date Value Ref Range Status  09/03/2021 9.8 8.6 - 10.4 mg/dL Final         Passed - Valid encounter within last 12 months    Recent Outpatient Visits          7 months ago Encounter for general adult medical examination with abnormal findings   [facility] [place], Coralie Keens, NP   1 year ago Class 2 severe obesity due to excess calories with serious comorbidity and body mass index (BMI) of 35.0 to 35.9 in adult [facility])   [facility], Coralie Keens, NP   2 years ago Essential hypertension   [facility] [place], Lupita Raider, FNP   2 years ago Nausea   [facility] [place], Lupita Raider, FNP   2 years ago CKD stage 5 secondary to hypertension [facility])   [facility] Olin Hauser, DO      Future Appointments            In 2 weeks Garnette Gunner, Coralie Keens, NP [facility], [facility]

## 2022-04-30 ENCOUNTER — Other Ambulatory Visit: Payer: Self-pay | Admitting: Internal Medicine

## 2022-04-30 DIAGNOSIS — Z0001 Encounter for general adult medical examination with abnormal findings: Secondary | ICD-10-CM

## 2022-05-01 NOTE — Telephone Encounter (Signed)
Requested Prescriptions  Pending Prescriptions Disp Refills  . amLODipine (NORVASC) 10 MG tablet [Pharmacy Med Name: amLODIPine Besylate 10 MG Oral Tablet] 90 tablet 0    Sig: Take 1 tablet by mouth once daily     Cardiovascular: Calcium Channel Blockers 2 Passed - 04/30/2022  4:30 PM      Passed - Last BP in normal range    BP Readings from Last 1 Encounters:  09/03/21 139/80         Passed - Last Heart Rate in normal range    Pulse Readings from Last 1 Encounters:  09/03/21 76         Passed - Valid encounter within last 6 months    Recent Outpatient Visits          8 months ago Encounter for general adult medical examination with abnormal findings   [facility] [place], Coralie Keens, NP   1 year ago Class 2 severe obesity due to excess calories with serious comorbidity and body mass index (BMI) of 35.0 to 35.9 in adult [facility])   [facility], Coralie Keens, NP   2 years ago Essential hypertension   [place], FNP   2 years ago Nausea   [facility] [place], Lupita Raider, FNP   2 years ago CKD stage 5 secondary to hypertension [facility])   [facility] Olin Hauser, DO      Future Appointments            In 2 weeks Garnette Gunner, Coralie Keens, NP [facility], [facility]

## 2022-05-18 ENCOUNTER — Ambulatory Visit: Payer: Medicare Other | Admitting: Internal Medicine

## 2022-05-20 ENCOUNTER — Other Ambulatory Visit: Payer: Self-pay | Admitting: Internal Medicine

## 2022-05-20 DIAGNOSIS — Z0001 Encounter for general adult medical examination with abnormal findings: Secondary | ICD-10-CM

## 2022-05-20 NOTE — Telephone Encounter (Signed)
Requested medication (s) are due for refill today: yes  Requested medication (s) are on the active medication list: yes  Last refill:  09/03/21 #90/1  Future visit scheduled: yes  Notes to clinic:  Unable to refill per protocol due to failed labs, no updated results.    Requested Prescriptions  Pending Prescriptions Disp Refills   losartan (COZAAR) 50 MG tablet [Pharmacy Med Name: Losartan Potassium 50 MG Oral Tablet] 90 tablet 0    Sig: Take 1 tablet by mouth once daily     Cardiovascular:  Angiotensin Receptor Blockers Failed - 05/20/2022 11:30 AM      Failed - Cr in normal range and within 180 days    Creat  Date Value Ref Range Status  09/03/2021 4.41 (H) 0.60 - 1.00 mg/dL Final   Creatinine, Urine  Date Value Ref Range Status  03/16/2018 166 mg/dL Final         Failed - K in normal range and within 180 days    Potassium  Date Value Ref Range Status  09/03/2021 4.6 3.5 - 5.3 mmol/L Final         Passed - Patient is not pregnant      Passed - Last BP in normal range    BP Readings from Last 1 Encounters:  09/03/21 139/80         Passed - Valid encounter within last 6 months    Recent Outpatient Visits           8 months ago Encounter for general adult medical examination with abnormal findings   [facility] [place], Coralie Keens, NP   1 year ago Class 2 severe obesity due to excess calories with serious comorbidity and body mass index (BMI) of 35.0 to 35.9 in adult [facility])   [facility], Coralie Keens, NP   2 years ago Essential hypertension   [place], FNP   2 years ago Nausea   [facility] [place], Lupita Raider, FNP   2 years ago CKD stage 5 secondary to hypertension [facility])   [facility] Olin Hauser, DO       Future Appointments             In 2 weeks Garnette Gunner, Coralie Keens, NP [facility], [facility]

## 2022-05-22 ENCOUNTER — Other Ambulatory Visit: Payer: Self-pay | Admitting: Internal Medicine

## 2022-05-22 DIAGNOSIS — Z0001 Encounter for general adult medical examination with abnormal findings: Secondary | ICD-10-CM

## 2022-05-22 NOTE — Telephone Encounter (Signed)
Duplicate request. Requested Prescriptions  Pending Prescriptions Disp Refills  . losartan (COZAAR) 50 MG tablet [Pharmacy Med Name: Losartan Potassium 50 MG Oral Tablet] 90 tablet 0    Sig: Take 1 tablet by mouth once daily     Cardiovascular:  Angiotensin Receptor Blockers Failed - 05/22/2022  4:16 PM      Failed - Cr in normal range and within 180 days    Creat  Date Value Ref Range Status  09/03/2021 4.41 (H) 0.60 - 1.00 mg/dL Final   Creatinine, Urine  Date Value Ref Range Status  03/16/2018 166 mg/dL Final         Failed - K in normal range and within 180 days    Potassium  Date Value Ref Range Status  09/03/2021 4.6 3.5 - 5.3 mmol/L Final         Passed - Patient is not pregnant      Passed - Last BP in normal range    BP Readings from Last 1 Encounters:  09/03/21 139/80         Passed - Valid encounter within last 6 months    Recent Outpatient Visits          8 months ago Encounter for general adult medical examination with abnormal findings   [facility] [place], Coralie Keens, NP   1 year ago Class 2 severe obesity due to excess calories with serious comorbidity and body mass index (BMI) of 35.0 to 35.9 in adult [facility])   [facility], Coralie Keens, NP   2 years ago Essential hypertension   [place], FNP   2 years ago Nausea   [facility] [place], Lupita Raider, FNP   2 years ago CKD stage 5 secondary to hypertension [facility])   [facility] Olin Hauser, DO      Future Appointments            In 2 weeks Garnette Gunner, Coralie Keens, NP [facility], [facility]

## 2022-05-23 ENCOUNTER — Other Ambulatory Visit: Payer: Self-pay | Admitting: Internal Medicine

## 2022-05-23 DIAGNOSIS — Z0001 Encounter for general adult medical examination with abnormal findings: Secondary | ICD-10-CM

## 2022-05-25 NOTE — Telephone Encounter (Signed)
Refilled 05/21/2022 #30 courtesy refill. Requested Prescriptions  Pending Prescriptions Disp Refills  . losartan (COZAAR) 50 MG tablet [Pharmacy Med Name: Losartan Potassium 50 MG Oral Tablet] 90 tablet 0    Sig: Take 1 tablet by mouth once daily     Cardiovascular:  Angiotensin Receptor Blockers Failed - 05/23/2022  2:34 PM      Failed - Cr in normal range and within 180 days    Creat  Date Value Ref Range Status  09/03/2021 4.41 (H) 0.60 - 1.00 mg/dL Final   Creatinine, Urine  Date Value Ref Range Status  03/16/2018 166 mg/dL Final         Failed - K in normal range and within 180 days    Potassium  Date Value Ref Range Status  09/03/2021 4.6 3.5 - 5.3 mmol/L Final         Passed - Patient is not pregnant      Passed - Last BP in normal range    BP Readings from Last 1 Encounters:  09/03/21 139/80         Passed - Valid encounter within last 6 months    Recent Outpatient Visits          8 months ago Encounter for general adult medical examination with abnormal findings   [facility] [place], Coralie Keens, NP   1 year ago Class 2 severe obesity due to excess calories with serious comorbidity and body mass index (BMI) of 35.0 to 35.9 in adult [facility])   [facility], Coralie Keens, NP   2 years ago Essential hypertension   [place], FNP   2 years ago Nausea   [facility] [place], Lupita Raider, FNP   2 years ago CKD stage 5 secondary to hypertension [facility])   [facility] Olin Hauser, DO      Future Appointments            In 2 weeks Garnette Gunner, Coralie Keens, NP [facility], [facility]

## 2022-06-09 ENCOUNTER — Ambulatory Visit (INDEPENDENT_AMBULATORY_CARE_PROVIDER_SITE_OTHER): Payer: Medicare Other | Admitting: Internal Medicine

## 2022-06-09 ENCOUNTER — Encounter: Payer: Self-pay | Admitting: Internal Medicine

## 2022-06-09 VITALS — BP 140/94 | HR 71 | Temp 96.9°F | Wt 190.0 lb

## 2022-06-09 DIAGNOSIS — Z0001 Encounter for general adult medical examination with abnormal findings: Secondary | ICD-10-CM | POA: Diagnosis not present

## 2022-06-09 DIAGNOSIS — N2581 Secondary hyperparathyroidism of renal origin: Secondary | ICD-10-CM

## 2022-06-09 DIAGNOSIS — N185 Chronic kidney disease, stage 5: Secondary | ICD-10-CM

## 2022-06-09 DIAGNOSIS — K219 Gastro-esophageal reflux disease without esophagitis: Secondary | ICD-10-CM

## 2022-06-09 DIAGNOSIS — E782 Mixed hyperlipidemia: Secondary | ICD-10-CM | POA: Diagnosis not present

## 2022-06-09 DIAGNOSIS — M1A3710 Chronic gout due to renal impairment, right ankle and foot, without tophus (tophi): Secondary | ICD-10-CM | POA: Diagnosis not present

## 2022-06-09 DIAGNOSIS — I120 Hypertensive chronic kidney disease with stage 5 chronic kidney disease or end stage renal disease: Secondary | ICD-10-CM | POA: Diagnosis not present

## 2022-06-09 DIAGNOSIS — Z6834 Body mass index (BMI) 34.0-34.9, adult: Secondary | ICD-10-CM

## 2022-06-09 DIAGNOSIS — I10 Essential (primary) hypertension: Secondary | ICD-10-CM | POA: Diagnosis not present

## 2022-06-09 DIAGNOSIS — E6609 Other obesity due to excess calories: Secondary | ICD-10-CM

## 2022-06-09 MED ORDER — METOPROLOL SUCCINATE ER 100 MG PO TB24: 100.0000 mg | ORAL_TABLET | Freq: Every day | ORAL | 1 refills | Status: DC

## 2022-06-09 MED ORDER — LOSARTAN POTASSIUM 50 MG PO TABS: 50.0000 mg | ORAL_TABLET | Freq: Every day | ORAL | 1 refills | Status: DC

## 2022-06-09 MED ORDER — OMEPRAZOLE 20 MG PO CPDR: 20.0000 mg | DELAYED_RELEASE_CAPSULE | Freq: Every day | ORAL | 1 refills | Status: DC

## 2022-06-09 MED ORDER — AMLODIPINE BESYLATE 10 MG PO TABS: 10.0000 mg | ORAL_TABLET | Freq: Every day | ORAL | 1 refills | Status: DC

## 2022-06-09 MED ORDER — ATORVASTATIN CALCIUM 20 MG PO TABS: 20.0000 mg | ORAL_TABLET | Freq: Every day | ORAL | 1 refills | Status: DC

## 2022-06-09 MED ORDER — CALCITRIOL 0.5 MCG PO CAPS: 0.5000 ug | ORAL_CAPSULE | Freq: Every day | ORAL | 1 refills | Status: DC

## 2022-06-09 NOTE — Assessment & Plan Note (Signed)
Elevated today but she has been out of her medicine Discussed the importance of medication compliance Reinforced DASH diet and exercise for weight loss Continue losartan, amlodipine and metoprolol, refilled today C-Met today

## 2022-06-09 NOTE — Assessment & Plan Note (Signed)
Encouraged her to avoid foods that trigger reflux Encouraged weight loss as this can help reduce reflux symptoms Continue omeprazole as needed, refilled today

## 2022-06-09 NOTE — Patient Instructions (Signed)
Chronic Kidney Disease, Adult Chronic kidney disease is when lasting damage happens to the kidneys slowly over a long time. The kidneys help to: Make pee (urine). Make hormones. Keep the right amount of fluids and chemicals in the body. Most often, this disease does not go away. You must take steps to help keep the kidney damage from getting worse. If steps are not taken, the kidneys might stop working forever. What are the causes? Diabetes. High blood pressure. Diseases that affect the heart and blood vessels. Other kidney diseases. Diseases of the body's disease-fighting system. A problem with the flow of pee. Infections of the organs that make pee, store it, and take it out of the body. Swelling or irritation of your blood vessels. What increases the risk? Getting older. Having someone in your family who has kidney disease or kidney failure. Having a disease caused by genes. Taking medicines often that harm the kidneys. Being near or having contact with harmful substances. Being very overweight. Using tobacco now or in the past. What are the signs or symptoms? Feeling very tired. Having a swollen face, legs, ankles, or feet. Feeling like you may vomit or vomiting. Not feeling hungry. Being confused or not able to focus. Twitches and cramps in the leg muscles or other muscles. Dry, itchy skin. A taste of metal in your mouth. Making less pee, or making more pee. Shortness of breath. Trouble sleeping. You may also become anemic or get weak bones. Anemic means there is not enough red blood cells or hemoglobin in your blood. You may get symptoms slowly. You may not notice them until the kidney damage gets very bad. How is this treated? Often, there is no cure for this disease. Treatment can help with symptoms and help keep the disease from getting worse. You may need to: Avoid alcohol. Avoid foods that are high in salt, potassium, phosphorous, and protein. Take medicines for  symptoms and to help control other conditions. Have dialysis. This treatment gets harmful waste out of your body. Treat other problems that cause your kidney disease or make it worse. Follow these instructions at home: Medicines Take over-the-counter and prescription medicines only as told by your doctor. Do not take any new medicines, vitamins, or supplements unless your doctor says it is okay. Lifestyle  Do not smoke or use any products that contain nicotine or tobacco. If you need help quitting, ask your doctor. If you drink alcohol: Limit how much you use to: 0-1 drink a day for women who are not pregnant. 0-2 drinks a day for men. Know how much alcohol is in your drink. In the [place] one drink equals one 12 oz bottle of beer (355 mL), one 5 oz glass of wine (148 mL), or one 1 oz glass of hard liquor (44 mL). Stay at a healthy weight. If you need help losing weight, ask your doctor. General instructions  Follow instructions from your doctor about what you cannot eat or drink. Track your blood pressure at home. Tell your doctor about any changes. If you have diabetes, track your blood sugar. Exercise at least 30 minutes a day, 5 days a week. Keep your shots (vaccinations) up to date. Keep all follow-up visits. Where to find more information American Association of Kidney Patients: www.aakp.org National Kidney Foundation: www.kidney.org American Kidney Fund: www.akfinc.org Life Options: www.lifeoptions.org Kidney School: www.kidneyschool.org Contact a doctor if: Your symptoms get worse. You get new symptoms. Get help right away if: You get symptoms of end-stage kidney disease. These   include: Headaches. Losing feeling in your hands or feet. Easy bruising. Having hiccups often. Chest pain. Shortness of breath. Lack of menstrual periods, in women. You have a fever. You make less pee than normal. You have pain or you bleed when you pee or poop. These symptoms may be an  emergency. Get help right away. Call your local emergency services (911 in the [place] Do not wait to see if the symptoms will go away. Do not drive yourself to the hospital. Summary Chronic kidney disease is when lasting damage happens to the kidneys slowly over a long time. Causes of this disease include diabetes and high blood pressure. Often, there is no cure for this disease. Treatment can help symptoms and help keep the disease from getting worse. Treatment may involve lifestyle changes, medicines, and dialysis. This information is not intended to replace advice given to you by your health care provider. Make sure you discuss any questions you have with your health care provider. Document Revised: 11/08/2019 Document Reviewed: 11/08/2019 Elsevier Patient Education  2023 Elsevier Inc.  

## 2022-06-09 NOTE — Assessment & Plan Note (Signed)
C-Met today Continue Calcitrol and sodium bicarb

## 2022-06-09 NOTE — Assessment & Plan Note (Signed)
She reports that she does not have kidney disease and she has seen nephrology in the past and will not go back C-Met today

## 2022-06-09 NOTE — Assessment & Plan Note (Signed)
Encourage diet and exercise for weight loss 

## 2022-06-09 NOTE — Progress Notes (Signed)
Subjective:    Patient ID: Destiny Adams, female    DOB: 07-06-1951, 71 y.o.   MRN: [id number]  HPI  Patient presents to clinic today for follow-up of chronic conditions.  HTN: Her BP today is 138/94.  She is taking Amlodipine, Losartan and Metoprolol as prescribed but she reports she has been out of her medicine for 3 days.  There is no ECG on file.  HLD: Her last LDL was 78, triglycerides 69, 08/2021.  She denies myalgias on Atorvastatin.  She tries to consume a low-fat diet.  GERD: Triggered by greasy and acidic food.  She takes Omeprazole only as needed.  There is no upper GI on file.  Hyperparathyroidism: Secondary to CKD.  She takes Calcitriol and Sodium Bicarb as prescribed.  She  does not follow with endocrinology.  CKD 45: Her last creatinine was 4.41, GFR 10, 08/2021.  She is taking Losartan as prescribed.  She does not follow with nephrology.  Anemia of CKD: Her last H/H was 10.9/34.1, 08/2021.  She is not taking any oral iron at this time.  She does not follow with hematology.  Gout: She reports recent flare in her right foot a few weeks ago. She does not take any preventative medication for this. She does not follow with rheumatology.  Review of Systems     Past Medical History:  Diagnosis Date   Anemia    CKD (chronic kidney disease) stage 4, GFR 15-29 ml/min (HCC) 07/23/2017   GERD (gastroesophageal reflux disease)    Hyperlipidemia    Hypertension    Severe obesity (BMI 35.0-35.9 with comorbidity) ([place]) 07/23/2017   Comorbid CKD stage IV, Hypertension, Hyperlipidemia    Current Outpatient Medications  Medication Sig Dispense Refill   acetaminophen (TYLENOL) 650 MG CR tablet Take 650 mg by mouth every 8 (eight) hours as needed for pain.     amLODipine (NORVASC) 10 MG tablet Take 1 tablet by mouth once daily 90 tablet 0   atorvastatin (LIPITOR) 20 MG tablet Take 1 tablet (20 mg total) by mouth daily. 90 tablet 1   calcitRIOL (ROCALTROL) 0.5 MCG capsule Take 1  capsule by mouth once daily 90 capsule 0   levocetirizine (XYZAL) 5 MG tablet Take 1 tablet (5 mg total) by mouth 3 (three) times a week. 36 tablet 1   losartan (COZAAR) 50 MG tablet Take 1 tablet by mouth once daily 30 tablet 0   metoprolol succinate (TOPROL-XL) 100 MG 24 hr tablet Take 1 tablet (100 mg total) by mouth daily. Take with or immediately following a meal. 90 tablet 1   omeprazole (PRILOSEC) 20 MG capsule Take 1 capsule (20 mg total) by mouth daily. 90 capsule 1   No current facility-administered medications for this visit.    No Known Allergies  Family History  Problem Relation Age of Onset   Colon cancer Maternal Uncle    Colon cancer Paternal Uncle    Breast cancer Paternal Aunt        13   Alzheimer's disease Mother    Cancer Father     Social History   Socioeconomic History   Marital status: Divorced    Spouse name: Not on file   Number of children: Not on file   Years of education: Not on file   Highest education level: Not on file  Occupational History   Occupation: retired  Tobacco Use   Smoking status: Never   Smokeless tobacco: Never  Vaping Use   Vaping Use: Never  used  Substance and Sexual Activity   Alcohol use: Not Currently   Drug use: Never   Sexual activity: Yes    Partners: Male  Other Topics Concern   Not on file  Social History Narrative   Not on file   Social Determinants of Health   Financial Resource Strain: Low Risk  (01/05/2022)   Overall Financial Resource Strain (CARDIA)    Difficulty of Paying Living Expenses: Not hard at all  Food Insecurity: No Food Insecurity (01/05/2022)   Hunger Vital Sign    Worried About Running Out of Food in the Last Year: Never true    Ran Out of Food in the Last Year: Never true  Transportation Needs: No Transportation Needs (01/05/2022)   PRAPARE - Hydrologist (Medical): No    Lack of Transportation (Non-Medical): No  Physical Activity: Insufficiently Active  (10/29/2020)   Exercise Vital Sign    Days of Exercise per Week: 3 days    Minutes of Exercise per Session: 30 min  Stress: No Stress Concern Present (01/05/2022)   [place]    Feeling of Stress : Not at all  Social Connections: Moderately Integrated (01/05/2022)   Social Connection and Isolation Panel [NHANES]    Frequency of Communication with Friends and Family: More than three times a week    Frequency of Social Gatherings with Friends and Family: More than three times a week    Attends Religious Services: More than 4 times per year    Active Member of [employer] or Organizations: Yes    Attends Music therapist: More than 4 times per year    Marital Status: Divorced  Intimate Partner Violence: Not At Risk (12/14/2017)   Humiliation, Afraid, Rape, and Kick questionnaire    Fear of Current or Ex-Partner: No    Emotionally Abused: No    Physically Abused: No    Sexually Abused: No     Constitutional: Denies fever, malaise, fatigue, headache or abrupt weight changes.  HEENT: Denies eye pain, eye redness, ear pain, ringing in the ears, wax buildup, runny nose, nasal congestion, bloody nose, or sore throat. Respiratory: Denies difficulty breathing, shortness of breath, cough or sputum production.   Cardiovascular: Denies chest pain, chest tightness, palpitations or swelling in the hands or feet.  Gastrointestinal: Denies abdominal pain, bloating, constipation, diarrhea or blood in the stool.  GU: Denies urgency, frequency, pain with urination, burning sensation, blood in urine, odor or discharge. Musculoskeletal: Patient reports intermittent pain and swelling of her right foot.  Denies decrease in range of motion, difficulty with gait, muscle pain.  Skin: Denies redness, rashes, lesions or ulcercations.  Neurological: Denies dizziness, difficulty with memory, difficulty with speech or problems with balance and  coordination.  Psych: Denies anxiety, depression, SI/HI.  No other specific complaints in a complete review of systems (except as listed in HPI above).  Objective:   Physical Exam  BP (!) 140/94 (BP Location: Right Arm, Patient Position: Sitting, Cuff Size: Normal)   Pulse 71   Temp (!) 96.9 F (36.1 C) (Temporal)   Wt 190 lb (86.2 kg)   SpO2 100%   BMI 34.75 kg/m   Wt Readings from Last 3 Encounters:  09/03/21 189 lb (85.7 kg)  01/06/21 195 lb 12.8 oz (88.8 kg)  10/29/20 185 lb (83.9 kg)    General: Appears her stated age, obese, in NAD. Skin: Warm, dry and intact. HEENT: Head: normal  shape and size; Eyes: sclera white, no icterus, conjunctiva pink, PERRLA and EOMs intact;  Neck:  Neck supple, trachea midline. No masses, lumps or thyromegaly present.  Cardiovascular: Normal rate and rhythm. S1,S2 noted.  No murmur, rubs or gallops noted. No JVD or BLE edema. No carotid bruits noted. Pulmonary/Chest: Normal effort and positive vesicular breath sounds. No respiratory distress. No wheezes, rales or ronchi noted.  Abdomen:  Normal bowel sounds.  Musculoskeletal:No difficulty with gait.  Neurological: Alert and oriented.    BMET    Component Value Date/Time   NA 136 09/03/2021 1104   K 4.6 09/03/2021 1104   CL 106 09/03/2021 1104   CO2 19 (L) 09/03/2021 1104   GLUCOSE 100 09/03/2021 1104   BUN 56 (H) 09/03/2021 1104   CREATININE 4.41 (H) 09/03/2021 1104   CALCIUM 9.8 09/03/2021 1104   GFRNONAA 14 (L) 04/25/2020 0957   GFRAA 17 (L) 04/25/2020 0957    Lipid Panel     Component Value Date/Time   CHOL 155 09/03/2021 1104   TRIG 69 09/03/2021 1104   HDL 62 09/03/2021 1104   CHOLHDL 2.5 09/03/2021 1104   LDLCALC 78 09/03/2021 1104    CBC    Component Value Date/Time   WBC 11.0 (H) 09/03/2021 1104   RBC 3.54 (L) 09/03/2021 1104   HGB 10.9 (L) 09/03/2021 1104   HCT 34.1 (L) 09/03/2021 1104   PLT 285 09/03/2021 1104   MCV 96.3 09/03/2021 1104   MCH 30.8  09/03/2021 1104   MCHC 32.0 09/03/2021 1104   RDW 12.2 09/03/2021 1104   LYMPHSABS 4,015 (H) 05/15/2020 0838   MONOABS 1.1 (H) 03/16/2018 0801   EOSABS 946 (H) 05/15/2020 0838   BASOSABS 66 05/15/2020 0838    Hgb A1C Lab Results  Component Value Date   HGBA1C 5.1 04/25/2020           Assessment & Plan:     RTC in 3 months for your annual exam Webb Silversmith, NP

## 2022-06-09 NOTE — Assessment & Plan Note (Signed)
Uric acid level today Reinforced low purine diet Avoid anti-inflammatories OTC

## 2022-06-09 NOTE — Assessment & Plan Note (Signed)
C-Met and lipid profile today Encouraged her to consume a low-fat diet Continue atorvastatin, refilled today

## 2022-06-10 LAB — COMPLETE METABOLIC PANEL WITH GFR
AG Ratio: 1.4 (calc) (ref 1.0–2.5)
ALT: 7 U/L (ref 6–29)
AST: 14 U/L (ref 10–35)
Albumin: 4.4 g/dL (ref 3.6–5.1)
Alkaline phosphatase (APISO): 73 U/L (ref 37–153)
BUN/Creatinine Ratio: 14 (calc) (ref 6–22)
BUN: 69 mg/dL — ABNORMAL HIGH (ref 7–25)
CO2: 21 mmol/L (ref 20–32)
Calcium: 9.1 mg/dL (ref 8.6–10.4)
Chloride: 105 mmol/L (ref 98–110)
Creat: 4.88 mg/dL — ABNORMAL HIGH (ref 0.60–1.00)
Globulin: 3.2 g/dL (calc) (ref 1.9–3.7)
Glucose, Bld: 106 mg/dL — ABNORMAL HIGH (ref 65–99)
Potassium: 4.3 mmol/L (ref 3.5–5.3)
Sodium: 138 mmol/L (ref 135–146)
Total Bilirubin: 0.4 mg/dL (ref 0.2–1.2)
Total Protein: 7.6 g/dL (ref 6.1–8.1)
eGFR: 9 mL/min/{1.73_m2} — ABNORMAL LOW (ref >=60)

## 2022-06-10 LAB — CBC
HCT: 32.5 % — ABNORMAL LOW (ref 35.0–45.0)
Hemoglobin: 10.8 g/dL — ABNORMAL LOW (ref 11.7–15.5)
MCH: 31.2 pg (ref 27.0–33.0)
MCHC: 33.2 g/dL (ref 32.0–36.0)
MCV: 93.9 fL (ref 80.0–100.0)
MPV: 10.9 fL (ref 7.5–12.5)
Platelets: 248 10*3/uL (ref 140–400)
RBC: 3.46 10*6/uL — ABNORMAL LOW (ref 3.80–5.10)
RDW: 13.1 % (ref 11.0–15.0)
WBC: 9.5 10*3/uL (ref 3.8–10.8)

## 2022-06-10 LAB — URIC ACID: Uric Acid, Serum: 8.3 mg/dL — ABNORMAL HIGH (ref 2.5–7.0)

## 2022-06-10 LAB — LIPID PANEL
Cholesterol: 139 mg/dL (ref <200)
HDL: 76 mg/dL (ref >=50)
LDL Cholesterol (Calc): 49 mg/dL (calc)
Non-HDL Cholesterol (Calc): 63 mg/dL (calc) (ref <130)
Total CHOL/HDL Ratio: 1.8 (calc) (ref <5.0)
Triglycerides: 64 mg/dL (ref <150)

## 2022-12-11 ENCOUNTER — Telehealth: Payer: Self-pay | Admitting: Internal Medicine

## 2022-12-11 NOTE — Telephone Encounter (Signed)
Copied from CRM [id number]. Topic: Medicare AWV >> Dec 11, 2022  2:35 PM Payton Doughty wrote: Reason for CRM: Called patient to schedule Medicare Annual Wellness Visit (AWV). No voicemail available to leave a message.  Last date of AWV: 01/05/22  Please schedule an appointment at any time with Kennedy Bucker, LPN  .  If any questions, please contact me.  Thank you ,  Verlee Rossetti; Care Guide Ambulatory Clinical Support [place] [facility] Direct Dial: [phone]

## 2022-12-17 ENCOUNTER — Other Ambulatory Visit: Payer: Self-pay | Admitting: Internal Medicine

## 2022-12-17 DIAGNOSIS — Z0001 Encounter for general adult medical examination with abnormal findings: Secondary | ICD-10-CM

## 2022-12-17 MED ORDER — METOPROLOL SUCCINATE ER 100 MG PO TB24: 100.0000 mg | ORAL_TABLET | Freq: Every day | ORAL | 0 refills | Status: DC

## 2022-12-17 NOTE — Telephone Encounter (Signed)
Medication Refill - Medication: metoprolol succinate (TOPROL-XL) 100 MG    Pt states that she is out of medication.   Has the patient contacted their pharmacy? Yes.    Preferred Pharmacy (with phone number or street name): [facility] [address] [place]), [place] - [address]  Phone: [phone] Fax: [phone]  Has the patient been seen for an appointment in the last year OR does the patient have an upcoming appointment? Yes.    Agent: Please be advised that RX refills may take up to 3 business days. We ask that you follow-up with your pharmacy.

## 2022-12-17 NOTE — Telephone Encounter (Signed)
Requested Prescriptions  Pending Prescriptions Disp Refills   metoprolol succinate (TOPROL-XL) 100 MG 24 hr tablet 90 tablet 0    Sig: Take 1 tablet (100 mg total) by mouth daily. Take with or immediately following a meal.     Cardiovascular:  Beta Blockers Failed - 12/17/2022 11:26 AM      Failed - Last BP in normal range    BP Readings from Last 1 Encounters:  06/09/22 (!) 140/94         Failed - Valid encounter within last 6 months    Recent Outpatient Visits           6 months ago CKD stage 5 secondary to hypertension [facility])   [place] [facility] [place], Salvadore Oxford, NP   1 year ago Encounter for general adult medical examination with abnormal findings   [place] [facility] [place], Salvadore Oxford, NP   1 year ago Class 2 severe obesity due to excess calories with serious comorbidity and body mass index (BMI) of 35.0 to 35.9 in adult [facility])   [place] [facility] [place], Salvadore Oxford, NP   2 years ago Essential hypertension   [place] [facility] [place], Jodelle Gross, FNP   3 years ago Nausea   [place] [facility] [place], Jodelle Gross, FNP       Future Appointments             In 2 weeks [place], Salvadore Oxford, NP [place]            Passed - Last Heart Rate in normal range    Pulse Readings from Last 1 Encounters:  06/09/22 71

## 2022-12-24 ENCOUNTER — Other Ambulatory Visit: Payer: Self-pay | Admitting: Internal Medicine

## 2022-12-24 DIAGNOSIS — Z0001 Encounter for general adult medical examination with abnormal findings: Secondary | ICD-10-CM

## 2022-12-24 NOTE — Telephone Encounter (Signed)
Appt 5/22 Requested Prescriptions  Pending Prescriptions Disp Refills   losartan (COZAAR) 50 MG tablet [Pharmacy Med Name: Losartan Potassium 50 MG Oral Tablet] 90 tablet 0    Sig: Take 1 tablet by mouth once daily     Cardiovascular:  Angiotensin Receptor Blockers Failed - 12/24/2022  2:37 PM      Failed - Cr in normal range and within 180 days    Creat  Date Value Ref Range Status  06/09/2022 4.88 (H) 0.60 - 1.00 mg/dL Final   Creatinine, Urine  Date Value Ref Range Status  03/16/2018 166 mg/dL Final         Failed - K in normal range and within 180 days    Potassium  Date Value Ref Range Status  06/09/2022 4.3 3.5 - 5.3 mmol/L Final         Failed - Last BP in normal range    BP Readings from Last 1 Encounters:  06/09/22 (!) 140/94         Failed - Valid encounter within last 6 months    Recent Outpatient Visits           6 months ago CKD stage 5 secondary to hypertension [facility])   [facility] [place], Salvadore Oxford, NP   1 year ago Encounter for general adult medical examination with abnormal findings   [place] [facility] [place], [place] W, NP   1 year ago Class 2 severe obesity due to excess calories with serious comorbidity and body mass index (BMI) of 35.0 to 35.9 in adult [facility])   [place] [facility] [place], Salvadore Oxford, NP   2 years ago Essential hypertension   [place] [facility] [place], Jodelle Gross, FNP   3 years ago Nausea   [place] [facility] [place], Jodelle Gross, FNP       Future Appointments             In 1 week Baity, Salvadore Oxford, NP  [facility] [place], [facility]            Passed - Patient is not pregnant

## 2022-12-28 ENCOUNTER — Other Ambulatory Visit: Payer: Self-pay | Admitting: Internal Medicine

## 2022-12-28 DIAGNOSIS — Z0001 Encounter for general adult medical examination with abnormal findings: Secondary | ICD-10-CM

## 2022-12-29 NOTE — Telephone Encounter (Signed)
Unable to refill per protocol, Rx request was refilled 12/24/22 for 90 days, duplicate request.E-Prescribing Status: Receipt confirmed by pharmacy (12/24/2022  4:07 PM EDT).  Requested Prescriptions  Pending Prescriptions Disp Refills   losartan (COZAAR) 50 MG tablet [Pharmacy Med Name: Losartan Potassium 50 MG Oral Tablet] 90 tablet 0    Sig: Take 1 tablet by mouth once daily     Cardiovascular:  Angiotensin Receptor Blockers Failed - 12/28/2022 10:33 AM      Failed - Cr in normal range and within 180 days    Creat  Date Value Ref Range Status  06/09/2022 4.88 (H) 0.60 - 1.00 mg/dL Final   Creatinine, Urine  Date Value Ref Range Status  03/16/2018 166 mg/dL Final         Failed - K in normal range and within 180 days    Potassium  Date Value Ref Range Status  06/09/2022 4.3 3.5 - 5.3 mmol/L Final         Failed - Last BP in normal range    BP Readings from Last 1 Encounters:  06/09/22 (!) 140/94         Failed - Valid encounter within last 6 months    Recent Outpatient Visits           6 months ago CKD stage 5 secondary to hypertension [facility])   [place] [facility], Salvadore Oxford, NP   1 year ago Encounter for general adult medical examination with abnormal findings   [place] [facility] [place], [place], NP   1 year ago Class 2 severe obesity due to excess calories with serious comorbidity and body mass index (BMI) of 35.0 to 35.9 in adult [facility])   [place] [facility] [place], Salvadore Oxford, NP   2 years ago Essential hypertension   [place] [facility] [place], Jodelle Gross, FNP   3 years ago Nausea   [place] [facility] [place], Jodelle Gross, FNP       Future Appointments             In 1 week Baity, Salvadore Oxford, NP [place] [facility], [facility]            Passed - Patient is not pregnant

## 2022-12-30 ENCOUNTER — Other Ambulatory Visit: Payer: Self-pay | Admitting: Internal Medicine

## 2022-12-30 DIAGNOSIS — Z0001 Encounter for general adult medical examination with abnormal findings: Secondary | ICD-10-CM

## 2022-12-31 ENCOUNTER — Other Ambulatory Visit: Payer: Self-pay | Admitting: Internal Medicine

## 2022-12-31 DIAGNOSIS — Z0001 Encounter for general adult medical examination with abnormal findings: Secondary | ICD-10-CM

## 2022-12-31 NOTE — Telephone Encounter (Signed)
Requested Prescriptions  Refused Prescriptions Disp Refills   losartan (COZAAR) 50 MG tablet [Pharmacy Med Name: Losartan Potassium 50 MG Oral Tablet] 90 tablet 0    Sig: Take 1 tablet by mouth once daily     Cardiovascular:  Angiotensin Receptor Blockers Failed - 12/30/2022  5:45 PM      Failed - Cr in normal range and within 180 days    Creat  Date Value Ref Range Status  06/09/2022 4.88 (H) 0.60 - 1.00 mg/dL Final   Creatinine, Urine  Date Value Ref Range Status  03/16/2018 166 mg/dL Final         Failed - K in normal range and within 180 days    Potassium  Date Value Ref Range Status  06/09/2022 4.3 3.5 - 5.3 mmol/L Final         Failed - Last BP in normal range    BP Readings from Last 1 Encounters:  06/09/22 (!) 140/94         Failed - Valid encounter within last 6 months    Recent Outpatient Visits           6 months ago CKD stage 5 secondary to hypertension [facility])   [place] [facility] [place], Salvadore Oxford, NP   1 year ago Encounter for general adult medical examination with abnormal findings   [place] [facility] [place], [place], NP   1 year ago Class 2 severe obesity due to excess calories with serious comorbidity and body mass index (BMI) of 35.0 to 35.9 in adult [facility])   [facility] [place], Salvadore Oxford, NP   2 years ago Essential hypertension   [place] [facility] [place], Jodelle Gross, FNP   3 years ago Nausea   [place] [facility] [place], Jodelle Gross, FNP       Future Appointments             In 6 days Baity, Salvadore Oxford, NP  [facility], [facility]            Passed - Patient is not pregnant

## 2023-01-01 ENCOUNTER — Other Ambulatory Visit: Payer: Self-pay | Admitting: Internal Medicine

## 2023-01-01 DIAGNOSIS — Z0001 Encounter for general adult medical examination with abnormal findings: Secondary | ICD-10-CM

## 2023-01-01 NOTE — Telephone Encounter (Signed)
This med was reordered 12/24/22 for 3 months worth  Requested Prescriptions  Refused Prescriptions Disp Refills   losartan (COZAAR) 50 MG tablet [Pharmacy Med Name: Losartan Potassium 50 MG Oral Tablet] 90 tablet 0    Sig: Take 1 tablet by mouth once daily     Cardiovascular:  Angiotensin Receptor Blockers Failed - 01/01/2023 10:18 AM      Failed - Cr in normal range and within 180 days    Creat  Date Value Ref Range Status  06/09/2022 4.88 (H) 0.60 - 1.00 mg/dL Final   Creatinine, Urine  Date Value Ref Range Status  03/16/2018 166 mg/dL Final         Failed - K in normal range and within 180 days    Potassium  Date Value Ref Range Status  06/09/2022 4.3 3.5 - 5.3 mmol/L Final         Failed - Last BP in normal range    BP Readings from Last 1 Encounters:  06/09/22 (!) 140/94         Failed - Valid encounter within last 6 months    Recent Outpatient Visits           6 months ago CKD stage 5 secondary to hypertension [facility])   [place] [facility] [place], Salvadore Oxford, NP   1 year ago Encounter for general adult medical examination with abnormal findings   [place] [facility] [place], [place], NP   1 year ago Class 2 severe obesity due to excess calories with serious comorbidity and body mass index (BMI) of 35.0 to 35.9 in adult [facility])   [place] [facility] [place], Salvadore Oxford, NP   2 years ago Essential hypertension   [place] [facility] [place], Jodelle Gross, FNP   3 years ago Nausea   [place] [facility] [place], Jodelle Gross, FNP       Future Appointments             In 5 days Baity, Salvadore Oxford, NP [place] [facility], [facility]            Passed - Patient is not pregnant

## 2023-01-01 NOTE — Telephone Encounter (Signed)
Med was reordered 12/24/22 #90 and sent to requested pharmacy Requested Prescriptions  Refused Prescriptions Disp Refills   losartan (COZAAR) 50 MG tablet [Pharmacy Med Name: Losartan Potassium 50 MG Oral Tablet] 90 tablet 0    Sig: Take 1 tablet by mouth once daily     Cardiovascular:  Angiotensin Receptor Blockers Failed - 12/31/2022  6:58 PM      Failed - Cr in normal range and within 180 days    Creat  Date Value Ref Range Status  06/09/2022 4.88 (H) 0.60 - 1.00 mg/dL Final   Creatinine, Urine  Date Value Ref Range Status  03/16/2018 166 mg/dL Final         Failed - K in normal range and within 180 days    Potassium  Date Value Ref Range Status  06/09/2022 4.3 3.5 - 5.3 mmol/L Final         Failed - Last BP in normal range    BP Readings from Last 1 Encounters:  06/09/22 (!) 140/94         Failed - Valid encounter within last 6 months    Recent Outpatient Visits           6 months ago CKD stage 5 secondary to hypertension [facility])   [place] [facility] [place], Salvadore Oxford, NP   1 year ago Encounter for general adult medical examination with abnormal findings   [place] [facility] [place], [place], NP   1 year ago Class 2 severe obesity due to excess calories with serious comorbidity and body mass index (BMI) of 35.0 to 35.9 in adult [facility])   [place] [facility] [place], Salvadore Oxford, NP   2 years ago Essential hypertension   [place] [facility] [place], Jodelle Gross, FNP   3 years ago Nausea   [place] [facility] [place], Jodelle Gross, FNP       Future Appointments             In 5 days [place], Salvadore Oxford, NP [place] [facility], [facility]            Passed - Patient is not pregnant

## 2023-01-06 ENCOUNTER — Ambulatory Visit: Payer: 59 | Admitting: Internal Medicine

## 2023-01-07 ENCOUNTER — Encounter: Payer: Self-pay | Admitting: Internal Medicine

## 2023-01-07 ENCOUNTER — Ambulatory Visit (INDEPENDENT_AMBULATORY_CARE_PROVIDER_SITE_OTHER): Payer: 59 | Admitting: Internal Medicine

## 2023-01-07 VITALS — BP 158/94 | HR 70 | Temp 96.6°F | Ht 62.0 in | Wt 182.0 lb

## 2023-01-07 DIAGNOSIS — E785 Hyperlipidemia, unspecified: Secondary | ICD-10-CM | POA: Diagnosis not present

## 2023-01-07 DIAGNOSIS — Z6833 Body mass index (BMI) 33.0-33.9, adult: Secondary | ICD-10-CM

## 2023-01-07 DIAGNOSIS — Z0001 Encounter for general adult medical examination with abnormal findings: Secondary | ICD-10-CM

## 2023-01-07 DIAGNOSIS — R739 Hyperglycemia, unspecified: Secondary | ICD-10-CM

## 2023-01-07 DIAGNOSIS — Z862 Personal history of diseases of the blood and blood-forming organs and certain disorders involving the immune mechanism: Secondary | ICD-10-CM | POA: Insufficient documentation

## 2023-01-07 DIAGNOSIS — I10 Essential (primary) hypertension: Secondary | ICD-10-CM

## 2023-01-07 DIAGNOSIS — M1A3710 Chronic gout due to renal impairment, right ankle and foot, without tophus (tophi): Secondary | ICD-10-CM | POA: Diagnosis not present

## 2023-01-07 DIAGNOSIS — R7309 Other abnormal glucose: Secondary | ICD-10-CM | POA: Diagnosis not present

## 2023-01-07 DIAGNOSIS — E66811 Obesity, class 1: Secondary | ICD-10-CM

## 2023-01-07 DIAGNOSIS — E6609 Other obesity due to excess calories: Secondary | ICD-10-CM

## 2023-01-07 DIAGNOSIS — D631 Anemia in chronic kidney disease: Secondary | ICD-10-CM | POA: Insufficient documentation

## 2023-01-07 MED ORDER — LOSARTAN POTASSIUM 50 MG PO TABS: 100.0000 mg | ORAL_TABLET | Freq: Every day | ORAL | 0 refills | Status: DC

## 2023-01-07 NOTE — Assessment & Plan Note (Signed)
Encouraged diet and exercise for weight loss ?

## 2023-01-07 NOTE — Assessment & Plan Note (Signed)
Uncontrolled Increase Losartan to 100 mg (2 50 mg tabs) daily Continue Amlodipine and Metoprolol Reinforced DASH diet and exercise for weight loss

## 2023-01-07 NOTE — Patient Instructions (Signed)
Health Maintenance for Postmenopausal Women Menopause is a normal process in which your ability to get pregnant comes to an end. This process happens slowly over many months or years, usually between the ages of 48 and 55. Menopause is complete when you have missed your menstrual period for 12 months. It is important to talk with your health care provider about some of the most common conditions that affect women after menopause (postmenopausal women). These include heart disease, cancer, and bone loss (osteoporosis). Adopting a healthy lifestyle and getting preventive care can help to promote your health and wellness. The actions you take can also lower your chances of developing some of these common conditions. What are the signs and symptoms of menopause? During menopause, you may have the following symptoms: Hot flashes. These can be moderate or severe. Night sweats. Decrease in sex drive. Mood swings. Headaches. Tiredness (fatigue). Irritability. Memory problems. Problems falling asleep or staying asleep. Talk with your health care provider about treatment options for your symptoms. Do I need hormone replacement therapy? Hormone replacement therapy is effective in treating symptoms that are caused by menopause, such as hot flashes and night sweats. Hormone replacement carries certain risks, especially as you become older. If you are thinking about using estrogen or estrogen with progestin, discuss the benefits and risks with your health care provider. How can I reduce my risk for heart disease and stroke? The risk of heart disease, heart attack, and stroke increases as you age. One of the causes may be a change in the body's hormones during menopause. This can affect how your body uses dietary fats, triglycerides, and cholesterol. Heart attack and stroke are medical emergencies. There are many things that you can do to help prevent heart disease and stroke. Watch your blood pressure High  blood pressure causes heart disease and increases the risk of stroke. This is more likely to develop in people who have high blood pressure readings or are overweight. Have your blood pressure checked: Every 3-5 years if you are 18-39 years of age. Every year if you are 40 years old or older. Eat a healthy diet  Eat a diet that includes plenty of vegetables, fruits, low-fat dairy products, and lean protein. Do not eat a lot of foods that are high in solid fats, added sugars, or sodium. Get regular exercise Get regular exercise. This is one of the most important things you can do for your health. Most adults should: Try to exercise for at least 150 minutes each week. The exercise should increase your heart rate and make you sweat (moderate-intensity exercise). Try to do strengthening exercises at least twice each week. Do these in addition to the moderate-intensity exercise. Spend less time sitting. Even light physical activity can be beneficial. Other tips Work with your health care provider to achieve or maintain a healthy weight. Do not use any products that contain nicotine or tobacco. These products include cigarettes, chewing tobacco, and vaping devices, such as e-cigarettes. If you need help quitting, ask your health care provider. Know your numbers. Ask your health care provider to check your cholesterol and your blood sugar (glucose). Continue to have your blood tested as directed by your health care provider. Do I need screening for cancer? Depending on your health history and family history, you may need to have cancer screenings at different stages of your life. This may include screening for: Breast cancer. Cervical cancer. Lung cancer. Colorectal cancer. What is my risk for osteoporosis? After menopause, you may be   at increased risk for osteoporosis. Osteoporosis is a condition in which bone destruction happens more quickly than new bone creation. To help prevent osteoporosis or  the bone fractures that can happen because of osteoporosis, you may take the following actions: If you are 19-50 years old, get at least 1,000 mg of calcium and at least 600 international units (IU) of vitamin D per day. If you are older than age 50 but younger than age 70, get at least 1,200 mg of calcium and at least 600 international units (IU) of vitamin D per day. If you are older than age 70, get at least 1,200 mg of calcium and at least 800 international units (IU) of vitamin D per day. Smoking and drinking excessive alcohol increase the risk of osteoporosis. Eat foods that are rich in calcium and vitamin D, and do weight-bearing exercises several times each week as directed by your health care provider. How does menopause affect my mental health? Depression may occur at any age, but it is more common as you become older. Common symptoms of depression include: Feeling depressed. Changes in sleep patterns. Changes in appetite or eating patterns. Feeling an overall lack of motivation or enjoyment of activities that you previously enjoyed. Frequent crying spells. Talk with your health care provider if you think that you are experiencing any of these symptoms. General instructions See your health care provider for regular wellness exams and vaccines. This may include: Scheduling regular health, dental, and eye exams. Getting and maintaining your vaccines. These include: Influenza vaccine. Get this vaccine each year before the flu season begins. Pneumonia vaccine. Shingles vaccine. Tetanus, diphtheria, and pertussis (Tdap) booster vaccine. Your health care provider may also recommend other immunizations. Tell your health care provider if you have ever been abused or do not feel safe at home. Summary Menopause is a normal process in which your ability to get pregnant comes to an end. This condition causes hot flashes, night sweats, decreased interest in sex, mood swings, headaches, or lack  of sleep. Treatment for this condition may include hormone replacement therapy. Take actions to keep yourself healthy, including exercising regularly, eating a healthy diet, watching your weight, and checking your blood pressure and blood sugar levels. Get screened for cancer and depression. Make sure that you are up to date with all your vaccines. This information is not intended to replace advice given to you by your health care provider. Make sure you discuss any questions you have with your health care provider. Document Revised: 12/23/2020 Document Reviewed: 12/23/2020 Elsevier Patient Education  2023 Elsevier Inc.  

## 2023-01-07 NOTE — Progress Notes (Signed)
Subjective:    Patient ID: Destiny Adams, female    DOB: [birth year], 72 y.o.   MRN: [id number]  HPI  Patient presents to clinic today for her annual exam. Of note, her BP today is 162/98. She is taking Amlodipine, Losartan and Metoprolol as prescribed.   Flu: never Tetanus: > 10 years ago COVID: Moderna x 3 Pneumovax: Never Prevnar 20: 08/2021 Shingrix: never Pap smear: no longer screening Mammogram: never Bone density: never Colon screening: 07/2016 Vision screening: annually Dentist: biannually  Diet: She does eat meat. She consumes fruits and veggies. She does eat some fried foods. She drinks mostly juice, some soda, water. Exercise: None  Review of Systems     Past Medical History:  Diagnosis Date   Anemia    CKD (chronic kidney disease) stage 4, GFR 15-29 ml/min (HCC) 07/23/2017   GERD (gastroesophageal reflux disease)    Hyperlipidemia    Hypertension    Severe obesity (BMI 35.0-35.9 with comorbidity) (HCC) 07/23/2017   Comorbid CKD stage IV, Hypertension, Hyperlipidemia    Current Outpatient Medications  Medication Sig Dispense Refill   acetaminophen (TYLENOL) 650 MG CR tablet Take 650 mg by mouth every 8 (eight) hours as needed for pain.     amLODipine (NORVASC) 10 MG tablet Take 1 tablet (10 mg total) by mouth daily. 90 tablet 1   atorvastatin (LIPITOR) 20 MG tablet Take 1 tablet (20 mg total) by mouth daily. 90 tablet 1   calcitRIOL (ROCALTROL) 0.5 MCG capsule Take 1 capsule (0.5 mcg total) by mouth daily. 90 capsule 1   losartan (COZAAR) 50 MG tablet Take 1 tablet by mouth once daily 90 tablet 0   metoprolol succinate (TOPROL-XL) 100 MG 24 hr tablet Take 1 tablet (100 mg total) by mouth daily. Take with or immediately following a meal. 90 tablet 0   omeprazole (PRILOSEC) 20 MG capsule Take 1 capsule (20 mg total) by mouth daily. 90 capsule 1   No current facility-administered medications for this visit.    No Known Allergies  Family History  Problem  Relation Age of Onset   Colon cancer Maternal Uncle    Colon cancer Paternal Uncle    Breast cancer Paternal Aunt        40   Alzheimer's disease Mother    Cancer Father     Social History   Socioeconomic History   Marital status: Divorced    Spouse name: Not on file   Number of children: Not on file   Years of education: Not on file   Highest education level: Not on file  Occupational History   Occupation: retired  Tobacco Use   Smoking status: Never   Smokeless tobacco: Never  Vaping Use   Vaping Use: Never used  Substance and Sexual Activity   Alcohol use: Not Currently   Drug use: Never   Sexual activity: Yes    Partners: Male  Other Topics Concern   Not on file  Social History Narrative   Not on file   Social Determinants of Health   Financial Resource Strain: Low Risk  (01/05/2022)   Overall Financial Resource Strain (CARDIA)    Difficulty of Paying Living Expenses: Not hard at all  Food Insecurity: No Food Insecurity (01/05/2022)   Hunger Vital Sign    Worried About Running Out of Food in the Last Year: Never true    Ran Out of Food in the Last Year: Never true  Transportation Needs: No Transportation Needs (01/05/2022)  PRAPARE - Administrator, Civil Service (Medical): No    Lack of Transportation (Non-Medical): No  Physical Activity: Insufficiently Active (10/29/2020)   Exercise Vital Sign    Days of Exercise per Week: 3 days    Minutes of Exercise per Session: 30 min  Stress: No Stress Concern Present (01/05/2022)   [employer] of Occupational Health - Occupational Stress Questionnaire    Feeling of Stress : Not at all  Social Connections: Moderately Integrated (01/05/2022)   Social Connection and Isolation Panel [NHANES]    Frequency of Communication with Friends and Family: More than three times a week    Frequency of Social Gatherings with Friends and Family: More than three times a week    Attends Religious Services: More than 4  times per year    Active Member of [employer] or Organizations: Yes    Attends Engineer, structural: More than 4 times per year    Marital Status: Divorced  Intimate Partner Violence: Not At Risk (12/14/2017)   Humiliation, Afraid, Rape, and Kick questionnaire    Fear of Current or Ex-Partner: No    Emotionally Abused: No    Physically Abused: No    Sexually Abused: No     Constitutional: Denies fever, malaise, fatigue, headache or abrupt weight changes.  HEENT: Denies eye pain, eye redness, ear pain, ringing in the ears, wax buildup, runny nose, nasal congestion, bloody nose, or sore throat. Respiratory: Denies difficulty breathing, shortness of breath, cough or sputum production.   Cardiovascular: Denies chest pain, chest tightness, palpitations or swelling in the hands or feet.  Gastrointestinal: Denies abdominal pain, bloating, constipation, diarrhea or blood in the stool.  GU: Denies urgency, frequency, pain with urination, burning sensation, blood in urine, odor or discharge. Musculoskeletal: Denies decrease in range of motion, difficulty with gait, muscle pain or joint pain and swelling.  Skin: Denies redness, rashes, lesions or ulcercations.  Neurological: Denies dizziness, difficulty with memory, difficulty with speech or problems with balance and coordination.  Psych: Denies anxiety, depression, SI/HI.  No other specific complaints in a complete review of systems (except as listed in HPI above).  Objective:   Physical Exam  BP (!) 158/94 (BP Location: Left Arm, Patient Position: Sitting, Cuff Size: Large)   Pulse 70   Temp (!) 96.6 F (35.9 C) (Temporal)   Ht 5\' 2"  (1.575 m)   Wt 182 lb (82.6 kg)   SpO2 100%   BMI 33.29 kg/m   Wt Readings from Last 3 Encounters:  06/09/22 190 lb (86.2 kg)  09/03/21 189 lb (85.7 kg)  01/06/21 195 lb 12.8 oz (88.8 kg)    General: Appears her stated age, obese, in NAD. Skin: Warm, dry and intact.  HEENT: Head: normal shape and  size; Eyes: sclera white, no icterus, conjunctiva pink, PERRLA and EOMs intact;  Neck:  Neck supple, trachea midline. No masses, lumps or thyromegaly present.  Cardiovascular: Normal rate and rhythm. S1,S2 noted.  No murmur, rubs or gallops noted. No JVD or BLE edema. No carotid bruits noted. Pulmonary/Chest: Normal effort and positive vesicular breath sounds. No respiratory distress. No wheezes, rales or ronchi noted.  Abdomen: Soft and nontender. Normal bowel sounds.  Musculoskeletal: Strength 5/5 BUE/BLE. No difficulty with gait.  Neurological: Alert and oriented. Cranial nerves II-XII grossly intact. Coordination normal.  Psychiatric: Mood and affect normal. Behavior is normal. Judgment and thought content normal.     BMET    Component Value Date/Time   NA 138  06/09/2022 0945   K 4.3 06/09/2022 0945   CL 105 06/09/2022 0945   CO2 21 06/09/2022 0945   GLUCOSE 106 (H) 06/09/2022 0945   BUN 69 (H) 06/09/2022 0945   CREATININE 4.88 (H) 06/09/2022 0945   CALCIUM 9.1 06/09/2022 0945   GFRNONAA 14 (L) 04/25/2020 0957   GFRAA 17 (L) 04/25/2020 0957    Lipid Panel     Component Value Date/Time   CHOL 139 06/09/2022 0945   TRIG 64 06/09/2022 0945   HDL 76 06/09/2022 0945   CHOLHDL 1.8 06/09/2022 0945   LDLCALC 49 06/09/2022 0945    CBC    Component Value Date/Time   WBC 9.5 06/09/2022 0945   RBC 3.46 (L) 06/09/2022 0945   HGB 10.8 (L) 06/09/2022 0945   HCT 32.5 (L) 06/09/2022 0945   PLT 248 06/09/2022 0945   MCV 93.9 06/09/2022 0945   MCH 31.2 06/09/2022 0945   MCHC 33.2 06/09/2022 0945   RDW 13.1 06/09/2022 0945   LYMPHSABS 4,015 (H) 05/15/2020 0838   MONOABS 1.1 (H) 03/16/2018 0801   EOSABS 946 (H) 05/15/2020 0838   BASOSABS 66 05/15/2020 0838    Hgb A1C Lab Results  Component Value Date   HGBA1C 5.1 04/25/2020            Assessment & Plan:   Preventative Health Maintenance:  Encouraged her to get a flu shot in the fall She declines tetanus for  financial reasons, advised if she gets better To go get this done Prevnar 20 UTD, does not need Pneumovax COVID-vaccine UTD Discussed Shingrix vaccine, she will check coverage with her insurance company schedule visit if she would like to have this done She no longer wants to screen for cervical cancer She declines mammogram and bone density Colon screening UTD Encouraged her to consume a balanced diet and exercise regimen Advised her to see an eye doctor and dentist annually We will check CBC, c-Met, lipid, A1c and uric acid today  RTC in 2 weeks follow up HTN, 6 months, follow-up chronic conditions Nicki Reaper, NP

## 2023-01-08 LAB — CBC
HCT: 33 % — ABNORMAL LOW (ref 35.0–45.0)
Hemoglobin: 10.6 g/dL — ABNORMAL LOW (ref 11.7–15.5)
MCH: 30.8 pg (ref 27.0–33.0)
MCHC: 32.1 g/dL (ref 32.0–36.0)
MCV: 95.9 fL (ref 80.0–100.0)
MPV: 10.4 fL (ref 7.5–12.5)
Platelets: 283 10*3/uL (ref 140–400)
RBC: 3.44 10*6/uL — ABNORMAL LOW (ref 3.80–5.10)
RDW: 12.9 % (ref 11.0–15.0)
WBC: 9.3 10*3/uL (ref 3.8–10.8)

## 2023-01-08 LAB — COMPLETE METABOLIC PANEL WITH GFR
AG Ratio: 1.3 (calc) (ref 1.0–2.5)
ALT: 8 U/L (ref 6–29)
AST: 13 U/L (ref 10–35)
Albumin: 4.2 g/dL (ref 3.6–5.1)
Alkaline phosphatase (APISO): 80 U/L (ref 37–153)
BUN/Creatinine Ratio: 9 (calc) (ref 6–22)
BUN: 55 mg/dL — ABNORMAL HIGH (ref 7–25)
CO2: 19 mmol/L — ABNORMAL LOW (ref 20–32)
Calcium: 8.4 mg/dL — ABNORMAL LOW (ref 8.6–10.4)
Chloride: 108 mmol/L (ref 98–110)
Creat: 6.15 mg/dL — ABNORMAL HIGH (ref 0.60–1.00)
Globulin: 3.2 g/dL (calc) (ref 1.9–3.7)
Glucose, Bld: 100 mg/dL — ABNORMAL HIGH (ref 65–99)
Potassium: 4.5 mmol/L (ref 3.5–5.3)
Sodium: 139 mmol/L (ref 135–146)
Total Bilirubin: 0.3 mg/dL (ref 0.2–1.2)
Total Protein: 7.4 g/dL (ref 6.1–8.1)
eGFR: 7 mL/min/{1.73_m2} — ABNORMAL LOW (ref >=60)

## 2023-01-08 LAB — HEMOGLOBIN A1C
Hgb A1c MFr Bld: 5.3 % of total Hgb (ref <5.7)
Mean Plasma Glucose: 105 mg/dL
eAG (mmol/L): 5.8 mmol/L

## 2023-01-08 LAB — LIPID PANEL
Cholesterol: 162 mg/dL (ref <200)
HDL: 55 mg/dL (ref >=50)
LDL Cholesterol (Calc): 89 mg/dL (calc)
Non-HDL Cholesterol (Calc): 107 mg/dL (calc) (ref <130)
Total CHOL/HDL Ratio: 2.9 (calc) (ref <5.0)
Triglycerides: 85 mg/dL (ref <150)

## 2023-01-08 LAB — URIC ACID: Uric Acid, Serum: 6.5 mg/dL (ref 2.5–7.0)

## 2023-01-21 ENCOUNTER — Other Ambulatory Visit: Payer: Self-pay

## 2023-01-21 ENCOUNTER — Ambulatory Visit (INDEPENDENT_AMBULATORY_CARE_PROVIDER_SITE_OTHER): Payer: 59 | Admitting: Internal Medicine

## 2023-01-21 ENCOUNTER — Encounter: Payer: Self-pay | Admitting: Internal Medicine

## 2023-01-21 VITALS — BP 151/85 | HR 82 | Temp 97.1°F | Wt 181.0 lb

## 2023-01-21 DIAGNOSIS — Z6833 Body mass index (BMI) 33.0-33.9, adult: Secondary | ICD-10-CM

## 2023-01-21 DIAGNOSIS — E6609 Other obesity due to excess calories: Secondary | ICD-10-CM

## 2023-01-21 DIAGNOSIS — I10 Essential (primary) hypertension: Secondary | ICD-10-CM | POA: Diagnosis not present

## 2023-01-21 DIAGNOSIS — K219 Gastro-esophageal reflux disease without esophagitis: Secondary | ICD-10-CM

## 2023-01-21 MED ORDER — FAMOTIDINE 20 MG PO TABS: 20.0000 mg | ORAL_TABLET | Freq: Every day | ORAL | 1 refills | Status: DC

## 2023-01-21 NOTE — Patient Instructions (Signed)
Chronic Kidney Disease, Adult Chronic kidney disease is when lasting damage happens to the kidneys slowly over a long time. The kidneys help to: Make pee (urine). Make hormones. Keep the right amount of fluids and chemicals in the body. Most often, this disease does not go away. You must take steps to help keep the kidney damage from getting worse. If steps are not taken, the kidneys might stop working forever. What are the causes? Diabetes. High blood pressure. Diseases that affect the heart and blood vessels. Other kidney diseases. Diseases of the body's disease-fighting system. A problem with the flow of pee. Infections of the organs that make pee, store it, and take it out of the body. Swelling or irritation of your blood vessels. What increases the risk? Getting older. Having someone in your family who has kidney disease or kidney failure. Having a disease caused by genes. Taking medicines often that harm the kidneys. Being near or having contact with harmful substances. Being very overweight. Using tobacco now or in the past. What are the signs or symptoms? Feeling very tired. Having a swollen face, legs, ankles, or feet. Feeling like you may vomit or vomiting. Not feeling hungry. Being confused or not able to focus. Twitches and cramps in the leg muscles or other muscles. Dry, itchy skin. A taste of metal in your mouth. Making less pee, or making more pee. Shortness of breath. Trouble sleeping. You may also become anemic or get weak bones. Anemic means there is not enough red blood cells or hemoglobin in your blood. You may get symptoms slowly. You may not notice them until the kidney damage gets very bad. How is this treated? Often, there is no cure for this disease. Treatment can help with symptoms and help keep the disease from getting worse. You may need to: Avoid alcohol. Avoid foods that are high in salt, potassium, phosphorous, and protein. Take medicines for  symptoms and to help control other conditions. Have dialysis. This treatment gets harmful waste out of your body. Treat other problems that cause your kidney disease or make it worse. Follow these instructions at home: Medicines Take over-the-counter and prescription medicines only as told by your doctor. Do not take any new medicines, vitamins, or supplements unless your doctor says it is okay. Lifestyle  Do not smoke or use any products that contain nicotine or tobacco. If you need help quitting, ask your doctor. If you drink alcohol: Limit how much you use to: 0-1 drink a day for women who are not pregnant. 0-2 drinks a day for men. Know how much alcohol is in your drink. In the [place] one drink equals one 12 oz bottle of beer (355 mL), one 5 oz glass of wine (148 mL), or one 1 oz glass of hard liquor (44 mL). Stay at a healthy weight. If you need help losing weight, ask your doctor. General instructions  Follow instructions from your doctor about what you cannot eat or drink. Track your blood pressure at home. Tell your doctor about any changes. If you have diabetes, track your blood sugar. Exercise at least 30 minutes a day, 5 days a week. Keep your shots (vaccinations) up to date. Keep all follow-up visits. Where to find more information American Association of Kidney Patients: ResidentialShow.is [employer]: www.kidney.org American Kidney Fund: FightingMatch.com.ee Life Options: www.lifeoptions.org Kidney School: www.kidneyschool.org Contact a doctor if: Your symptoms get worse. You get new symptoms. Get help right away if: You get symptoms of end-stage kidney disease. These  include: Headaches. Losing feeling in your hands or feet. Easy bruising. Having hiccups often. Chest pain. Shortness of breath. Lack of menstrual periods, in women. You have a fever. You make less pee than normal. You have pain or you bleed when you pee or poop. These symptoms may be an  emergency. Get help right away. Call your local emergency services (911 in the [place] Do not wait to see if the symptoms will go away. Do not drive yourself to the hospital. Summary Chronic kidney disease is when lasting damage happens to the kidneys slowly over a long time. Causes of this disease include diabetes and high blood pressure. Often, there is no cure for this disease. Treatment can help symptoms and help keep the disease from getting worse. Treatment may involve lifestyle changes, medicines, and dialysis. This information is not intended to replace advice given to you by your health care provider. Make sure you discuss any questions you have with your health care provider. Document Revised: 11/08/2019 Document Reviewed: 11/08/2019 Elsevier Patient Education  2024 [employer].

## 2023-01-21 NOTE — Assessment & Plan Note (Signed)
Avoid foods that trigger reflux Encourage weight loss as this can help reduce reflux symptoms Will discontinue omeprazole as this caused abdominal pain Rx for famotidine 20 mg daily

## 2023-01-21 NOTE — Assessment & Plan Note (Signed)
Encourage diet and exercise for weight loss 

## 2023-01-21 NOTE — Progress Notes (Signed)
Subjective:    Patient ID: Destiny Adams, female    DOB: 07/09/1951, 72 y.o.   MRN: [id number]  HPI  Patient presents to clinic today for 2-week follow-up of HTN.  At her last visit, her Losartan was increased to 100 mg.  She was advised to continue her Amlodipine and Metoprolol.  Her BP today is 144/92. She has been checking her blood pressure at home, it runs 127/86. There is no ECG on file.  Pt would also like to discuss treatment options for reflux.  She did not tolerate Omeprazole and would like to try something different. She reports they are causing having severe abdominal pain.   Review of Systems     Past Medical History:  Diagnosis Date   Anemia    CKD (chronic kidney disease) stage 4, GFR 15-29 ml/min (HCC) 07/23/2017   GERD (gastroesophageal reflux disease)    Hyperlipidemia    Hypertension    Severe obesity (BMI 35.0-35.9 with comorbidity) (HCC) 07/23/2017   Comorbid CKD stage IV, Hypertension, Hyperlipidemia    Current Outpatient Medications  Medication Sig Dispense Refill   acetaminophen (TYLENOL) 650 MG CR tablet Take 650 mg by mouth every 8 (eight) hours as needed for pain.     amLODipine (NORVASC) 10 MG tablet Take 1 tablet (10 mg total) by mouth daily. 90 tablet 1   atorvastatin (LIPITOR) 20 MG tablet Take 1 tablet (20 mg total) by mouth daily. 90 tablet 1   calcitRIOL (ROCALTROL) 0.5 MCG capsule Take 1 capsule (0.5 mcg total) by mouth daily. 90 capsule 1   ferrous sulfate 324 MG TBEC Take 324 mg by mouth.     losartan (COZAAR) 50 MG tablet Take 2 tablets (100 mg total) by mouth daily. 180 tablet 0   metoprolol succinate (TOPROL-XL) 100 MG 24 hr tablet Take 1 tablet (100 mg total) by mouth daily. Take with or immediately following a meal. 90 tablet 0   omeprazole (PRILOSEC) 20 MG capsule Take 1 capsule (20 mg total) by mouth daily. (Patient not taking: Reported on 01/07/2023) 90 capsule 1   No current facility-administered medications for this visit.    No  Known Allergies  Family History  Problem Relation Age of Onset   Colon cancer Maternal Uncle    Colon cancer Paternal Uncle    Breast cancer Paternal Aunt        42   Alzheimer's disease Mother    Cancer Father     Social History   Socioeconomic History   Marital status: Divorced    Spouse name: Not on file   Number of children: Not on file   Years of education: Not on file   Highest education level: Not on file  Occupational History   Occupation: retired  Tobacco Use   Smoking status: Never   Smokeless tobacco: Never  Vaping Use   Vaping Use: Never used  Substance and Sexual Activity   Alcohol use: Not Currently   Drug use: Never   Sexual activity: Yes    Partners: Male  Other Topics Concern   Not on file  Social History Narrative   Not on file   Social Determinants of Health   Financial Resource Strain: Low Risk  (01/05/2022)   Overall Financial Resource Strain (CARDIA)    Difficulty of Paying Living Expenses: Not hard at all  Food Insecurity: No Food Insecurity (01/05/2022)   Hunger Vital Sign    Worried About Running Out of Food in the Last Year: Never  true    Ran Out of Food in the Last Year: Never true  Transportation Needs: No Transportation Needs (01/05/2022)   PRAPARE - Administrator, Civil Service (Medical): No    Lack of Transportation (Non-Medical): No  Physical Activity: Insufficiently Active (10/29/2020)   Exercise Vital Sign    Days of Exercise per Week: 3 days    Minutes of Exercise per Session: 30 min  Stress: No Stress Concern Present (01/05/2022)   [employer] of Occupational Health - Occupational Stress Questionnaire    Feeling of Stress : Not at all  Social Connections: Moderately Integrated (01/05/2022)   Social Connection and Isolation Panel [NHANES]    Frequency of Communication with Friends and Family: More than three times a week    Frequency of Social Gatherings with Friends and Family: More than three times a week     Attends Religious Services: More than 4 times per year    Active Member of [employer] or Organizations: Yes    Attends Engineer, structural: More than 4 times per year    Marital Status: Divorced  Intimate Partner Violence: Not At Risk (12/14/2017)   Humiliation, Afraid, Rape, and Kick questionnaire    Fear of Current or Ex-Partner: No    Emotionally Abused: No    Physically Abused: No    Sexually Abused: No     Constitutional: Denies fever, malaise, fatigue, headache or abrupt weight changes.  Respiratory: Denies difficulty breathing, shortness of breath, cough or sputum production.   Cardiovascular: Denies chest pain, chest tightness, palpitations or swelling in the hands or feet.  Gastrointestinal: Pt reports reflux. Denies abdominal pain, bloating, constipation, diarrhea or blood in the stool.  GU: Denies urgency, frequency, pain with urination, burning sensation, blood in urine, odor or discharge. Musculoskeletal: Denies decrease in range of motion, difficulty with gait, muscle pain or joint pain and swelling.  Skin: Denies redness, rashes, lesions or ulcercations.  Neurological: Denies dizziness, difficulty with memory, difficulty with speech or problems with balance and coordination.  Psych: Denies anxiety, depression, SI/HI.  No other specific complaints in a complete review of systems (except as listed in HPI above).  Objective:   Physical Exam  BP (!) 151/85 (BP Location: Left Arm, Patient Position: Sitting, Cuff Size: Normal)   Pulse 82   Temp (!) 97.1 F (36.2 C) (Temporal)   Wt 181 lb (82.1 kg)   SpO2 99%   BMI 33.11 kg/m   Wt Readings from Last 3 Encounters:  01/07/23 182 lb (82.6 kg)  06/09/22 190 lb (86.2 kg)  09/03/21 189 lb (85.7 kg)    General: Appears her stated age, obese, in NAD. Cardiovascular: Normal rate and rhythm. S1,S2 noted.  No murmur, rubs or gallops noted. Pulmonary/Chest: Normal effort and positive vesicular breath sounds. No  respiratory distress. No wheezes, rales or ronchi noted.  Abdomen: Soft and nontender. Normal bowel sounds.  Musculoskeletal: No difficulty with gait.  Neurological: Alert and oriented.  Coordination normal.      BMET    Component Value Date/Time   NA 139 01/07/2023 0858   K 4.5 01/07/2023 0858   CL 108 01/07/2023 0858   CO2 19 (L) 01/07/2023 0858   GLUCOSE 100 (H) 01/07/2023 0858   BUN 55 (H) 01/07/2023 0858   CREATININE 6.15 (H) 01/07/2023 0858   CALCIUM 8.4 (L) 01/07/2023 0858   GFRNONAA 14 (L) 04/25/2020 0957   GFRAA 17 (L) 04/25/2020 0957    Lipid Panel  Component Value Date/Time   CHOL 162 01/07/2023 0858   TRIG 85 01/07/2023 0858   HDL 55 01/07/2023 0858   CHOLHDL 2.9 01/07/2023 0858   LDLCALC 89 01/07/2023 0858    CBC    Component Value Date/Time   WBC 9.3 01/07/2023 0858   RBC 3.44 (L) 01/07/2023 0858   HGB 10.6 (L) 01/07/2023 0858   HCT 33.0 (L) 01/07/2023 0858   PLT 283 01/07/2023 0858   MCV 95.9 01/07/2023 0858   MCH 30.8 01/07/2023 0858   MCHC 32.1 01/07/2023 0858   RDW 12.9 01/07/2023 0858   LYMPHSABS 4,015 (H) 05/15/2020 0838   MONOABS 1.1 (H) 03/16/2018 0801   EOSABS 946 (H) 05/15/2020 0838   BASOSABS 66 05/15/2020 0838    Hgb A1C Lab Results  Component Value Date   HGBA1C 5.3 01/07/2023           Assessment & Plan:      RTC in 5 months for follow-up of chronic conditions Nicki Reaper, NP

## 2023-01-21 NOTE — Assessment & Plan Note (Signed)
Improved but still not at goal She will not let me adjust any of her blood pressure medication at this time Continue losartan, metoprolol and amlodipine Reinforced DASH diet and exercise for weight loss

## 2023-01-27 ENCOUNTER — Other Ambulatory Visit: Payer: Self-pay | Admitting: Internal Medicine

## 2023-01-27 DIAGNOSIS — I120 Hypertensive chronic kidney disease with stage 5 chronic kidney disease or end stage renal disease: Secondary | ICD-10-CM

## 2023-01-28 NOTE — Telephone Encounter (Signed)
Requested Prescriptions  Pending Prescriptions Disp Refills   calcitRIOL (ROCALTROL) 0.5 MCG capsule [Pharmacy Med Name: Calcitriol 0.5 MCG Oral Capsule] 90 capsule 0    Sig: Take 1 capsule by mouth once daily     Endocrinology:  Vitamins - Vitamin D Supplementation - calcitriol Failed - 01/27/2023 10:46 AM      Failed - Phosphate in normal range and within 360 days    No results found for: "PHOS"       Failed - PTH in normal range and within 360 days    No results found for: "IOPTH", "PTHINTACTFNA", "PTH"       Failed - Ca in normal range and within 360 days    Calcium  Date Value Ref Range Status  01/07/2023 8.4 (L) 8.6 - 10.4 mg/dL Final         Passed - Valid encounter within last 12 months    Recent Outpatient Visits           1 week ago Primary hypertension   [place] [facility] [place], Salvadore Oxford, NP   3 weeks ago Encounter for general adult medical examination with abnormal findings   [place] [facility] [place], [place] W, NP   7 months ago CKD stage 5 secondary to hypertension [facility])   [place] [facility] [place], Salvadore Oxford, NP   1 year ago Encounter for general adult medical examination with abnormal findings   [place] [facility] [place], [place] W, NP   2 years ago Class 2 severe obesity due to excess calories with serious comorbidity and body mass index (BMI) of 35.0 to 35.9 in adult [facility])   [place] [facility] [place], Salvadore Oxford, [place]

## 2023-01-31 ENCOUNTER — Other Ambulatory Visit: Payer: Self-pay | Admitting: Internal Medicine

## 2023-01-31 DIAGNOSIS — I120 Hypertensive chronic kidney disease with stage 5 chronic kidney disease or end stage renal disease: Secondary | ICD-10-CM

## 2023-02-01 ENCOUNTER — Telehealth: Payer: Self-pay | Admitting: Internal Medicine

## 2023-02-01 DIAGNOSIS — I120 Hypertensive chronic kidney disease with stage 5 chronic kidney disease or end stage renal disease: Secondary | ICD-10-CM

## 2023-02-01 NOTE — Telephone Encounter (Signed)
[facility] called and spoke to [place], Pensions consultant about the refill(s) calcitriol requested. Advised it was sent on 01/28/23 #90/0 refill(s). She says it was received on 6/13 and the patient was notified x 3. I advised the phone number on file, she says they have a different one, she updated the number to match what is on file and says the patient will be notified it's available for pickup. Will refuse this request.   Requested Prescriptions  Pending Prescriptions Disp Refills   calcitRIOL (ROCALTROL) 0.5 MCG capsule [Pharmacy Med Name: Calcitriol 0.5 MCG Oral Capsule] 90 capsule 0    Sig: Take 1 capsule by mouth once daily     Endocrinology:  Vitamins - Vitamin D Supplementation - calcitriol Failed - 01/31/2023  9:57 AM      Failed - Phosphate in normal range and within 360 days    No results found for: "PHOS"       Failed - PTH in normal range and within 360 days    No results found for: "IOPTH", "PTHINTACTFNA", "PTH"       Failed - Ca in normal range and within 360 days    Calcium  Date Value Ref Range Status  01/07/2023 8.4 (L) 8.6 - 10.4 mg/dL Final         Passed - Valid encounter within last 12 months    Recent Outpatient Visits           1 week ago Primary hypertension   [place] [facility] [place], Salvadore Oxford, NP   3 weeks ago Encounter for general adult medical examination with abnormal findings   [place] [facility] [place], [place] W, NP   7 months ago CKD stage 5 secondary to hypertension [facility])   [place] [facility] [place], Salvadore Oxford, NP   1 year ago Encounter for general adult medical examination with abnormal findings   [place] [facility] [place], [place] W, NP   2 years ago Class 2 severe obesity due to excess calories with serious comorbidity and body mass index (BMI) of 35.0 to 35.9 in adult [facility])   [place] [facility] [place], Salvadore Oxford, [place]

## 2023-02-01 NOTE — Telephone Encounter (Addendum)
[facility] called and spoke to Destiny Adams, Pensions consultant about the refill(s) calcitriol requested. Advised it was sent on 01/28/23 #90/0 refill(s). She says it was received on 6/13 and the patient was notified x 3. I advised the phone number on file, she says they have a different one, she updated the number to match what is on file and says the patient will be notified it's available for pickup.

## 2023-02-01 NOTE — Telephone Encounter (Signed)
Copied from CRM [id number]. Topic: General - Other >> Feb 01, 2023 12:47 PM Everette C wrote: Reason for CRM: Medication Refill - Medication: calcitRIOL (ROCALTROL) 0.5 MCG capsule [[id number]]  Has the patient contacted their pharmacy? Yes.   (Agent: If no, request that the patient contact the pharmacy for the refill. If patient does not wish to contact the pharmacy document the reason why and proceed with request.) (Agent: If yes, when and what did the pharmacy advise?)  Preferred Pharmacy (with phone number or street name): [facility] [address] ([address]), [place] - [address] [place]) [place] [address] Phone: [phone] Fax: [phone] Hours: Not open 24 hours   Has the patient been seen for an appointment in the last year OR does the patient have an upcoming appointment? Yes.    Agent: Please be advised that RX refills may take up to 3 business days. We ask that you follow-up with your pharmacy.

## 2023-02-17 ENCOUNTER — Other Ambulatory Visit: Payer: Self-pay | Admitting: Internal Medicine

## 2023-02-17 DIAGNOSIS — Z0001 Encounter for general adult medical examination with abnormal findings: Secondary | ICD-10-CM

## 2023-02-17 NOTE — Telephone Encounter (Signed)
Unable to refill per protocol, Rx request is too soon. Last refill 01/07/23 for 90 days.  Requested Prescriptions  Pending Prescriptions Disp Refills   losartan (COZAAR) 50 MG tablet [Pharmacy Med Name: Losartan Potassium 50 MG Oral Tablet] 90 tablet 0    Sig: Take 1 tablet by mouth once daily     Cardiovascular:  Angiotensin Receptor Blockers Failed - 02/17/2023  9:35 AM      Failed - Cr in normal range and within 180 days    Creat  Date Value Ref Range Status  01/07/2023 6.15 (H) 0.60 - 1.00 mg/dL Final   Creatinine, Urine  Date Value Ref Range Status  03/16/2018 166 mg/dL Final         Failed - Last BP in normal range    BP Readings from Last 1 Encounters:  01/21/23 (!) 151/85         Passed - K in normal range and within 180 days    Potassium  Date Value Ref Range Status  01/07/2023 4.5 3.5 - 5.3 mmol/L Final         Passed - Patient is not pregnant      Passed - Valid encounter within last 6 months    Recent Outpatient Visits           3 weeks ago Primary hypertension   [place] [facility] [place], Salvadore Oxford, NP   1 month ago Encounter for general adult medical examination with abnormal findings   [place] [facility] [place], [place], NP   8 months ago CKD stage 5 secondary to hypertension [facility])   [place] [facility] [place], Salvadore Oxford, NP   1 year ago Encounter for general adult medical examination with abnormal findings   [place] [facility] [place], [place] W, NP   2 years ago Class 2 severe obesity due to excess calories with serious comorbidity and body mass index (BMI) of 35.0 to 35.9 in adult [facility])   [place] [facility] [place], Salvadore Oxford, [place]

## 2023-02-19 ENCOUNTER — Other Ambulatory Visit: Payer: Self-pay | Admitting: Internal Medicine

## 2023-02-19 DIAGNOSIS — Z0001 Encounter for general adult medical examination with abnormal findings: Secondary | ICD-10-CM

## 2023-02-19 NOTE — Telephone Encounter (Signed)
Requested Prescriptions  Pending Prescriptions Disp Refills   losartan (COZAAR) 50 MG tablet [Pharmacy Med Name: Losartan Potassium 50 MG Oral Tablet] 90 tablet 0    Sig: Take 1 tablet by mouth once daily     Cardiovascular:  Angiotensin Receptor Blockers Failed - 02/19/2023 12:26 PM      Failed - Cr in normal range and within 180 days    Creat  Date Value Ref Range Status  01/07/2023 6.15 (H) 0.60 - 1.00 mg/dL Final   Creatinine, Urine  Date Value Ref Range Status  03/16/2018 166 mg/dL Final         Failed - Last BP in normal range    BP Readings from Last 1 Encounters:  01/21/23 (!) 151/85         Passed - K in normal range and within 180 days    Potassium  Date Value Ref Range Status  01/07/2023 4.5 3.5 - 5.3 mmol/L Final         Passed - Patient is not pregnant      Passed - Valid encounter within last 6 months    Recent Outpatient Visits           4 weeks ago Primary hypertension   [place] [facility] [place], Salv[place] Oxford, NP   1 month ago Encounter for general adult medical examination with abnormal findings   [place] [facility] [place], [place], NP   8 months ago CKD stage 5 secondary to hypertension [facility])   [place] [facility] [place], Salvadore Oxford, NP   1 year ago Encounter for general adult medical examination with abnormal findings   [place] [facility] [place], [place], NP   2 years ago Class 2 severe obesity due to excess calories with serious comorbidity and body mass index (BMI) of 35.0 to 35.9 in adult [facility])   [place] [facility] [place], Salvadore Oxford, [place]

## 2023-02-22 ENCOUNTER — Telehealth: Payer: Self-pay | Admitting: Internal Medicine

## 2023-02-22 DIAGNOSIS — Z0001 Encounter for general adult medical examination with abnormal findings: Secondary | ICD-10-CM

## 2023-02-22 MED ORDER — LOSARTAN POTASSIUM 50 MG PO TABS
100.0000 mg | ORAL_TABLET | Freq: Every day | ORAL | 0 refills | Status: AC
Start: 2023-02-22 — End: ?

## 2023-02-22 NOTE — Telephone Encounter (Signed)
Medication Refill - Medication: losartan (COZAAR) 50 MG tablet     ... Patient stated this should be taken twice a day, also patient is out of meds.   Has the patient contacted their pharmacy? Yes.    Preferred Pharmacy (with phone number or street name):   [id number] - [place] (N), [place] - [address]  [address], [place] ([address]) [place] [address]     Has the patient been seen for an appointment in the last year OR does the patient have an upcoming appointment? Yes.    Agent: Please be advised that RX refills may take up to 3 business days. We ask that you follow-up with your pharmacy.

## 2023-03-18 ENCOUNTER — Other Ambulatory Visit: Payer: Self-pay | Admitting: Internal Medicine

## 2023-03-18 DIAGNOSIS — Z0001 Encounter for general adult medical examination with abnormal findings: Secondary | ICD-10-CM

## 2023-03-19 ENCOUNTER — Other Ambulatory Visit (HOSPITAL_COMMUNITY): Payer: Self-pay

## 2023-03-19 NOTE — Telephone Encounter (Signed)
Requested Prescriptions  Pending Prescriptions Disp Refills   metoprolol succinate (TOPROL-XL) 100 MG 24 hr tablet [Pharmacy Med Name: Metoprolol Succinate ER 100 MG Oral Tablet Extended Release 24 Hour] 90 tablet 0    Sig: TAKE 1 TABLET BY MOUTH ONCE DAILY TAKE  WITH  OR  IMMEDIATELY  FOLLOWING  A  MEAL     Cardiovascular:  Beta Blockers Failed - 03/18/2023  2:31 PM      Failed - Last BP in normal range    BP Readings from Last 1 Encounters:  01/21/23 (!) 151/85         Passed - Last Heart Rate in normal range    Pulse Readings from Last 1 Encounters:  01/21/23 82         Passed - Valid encounter within last 6 months    Recent Outpatient Visits           1 month ago Primary hypertension   [place] [facility] [place], Salvadore Oxford, NP   2 months ago Encounter for general adult medical examination with abnormal findings   [place] [facility] [place], [place], NP   9 months ago CKD stage 5 secondary to hypertension [facility])   [place] [facility] [place], Salv[place] Oxford, NP   1 year ago Encounter for general adult medical examination with abnormal findings   [place] [facility] [place], [place], NP   2 years ago Class 2 severe obesity due to excess calories with serious comorbidity and body mass index (BMI) of 35.0 to 35.9 in adult [facility])   [place] [facility] [place], [place], [place]

## 2023-04-15 ENCOUNTER — Ambulatory Visit (INDEPENDENT_AMBULATORY_CARE_PROVIDER_SITE_OTHER): Payer: 59

## 2023-04-15 DIAGNOSIS — Z0000 Encounter for general adult medical examination without abnormal findings: Secondary | ICD-10-CM | POA: Diagnosis not present

## 2023-04-15 DIAGNOSIS — Z780 Asymptomatic menopausal state: Secondary | ICD-10-CM

## 2023-04-15 NOTE — Patient Instructions (Addendum)
Destiny Adams , Thank you for taking time to come for your Medicare Wellness Visit. I appreciate your ongoing commitment to your health goals. Please review the following plan we discussed and let me know if I can assist you in the future.   Referrals/Orders/Follow-Ups/Clinician Recommendations:  BDS referral made  This is a list of the screening recommended for you and due dates:  Health Maintenance  Topic Date Due   DTaP/Tdap/Td vaccine (1 - Tdap) Never done   Zoster (Shingles) Vaccine (1 of 2) Never done   Flu Shot  03/18/2023   Mammogram  01/07/2024*   DEXA scan (bone density measurement)  01/07/2024*   Medicare Annual Wellness Visit  04/14/2024   Colon Cancer Screening  07/29/2026   Pneumonia Vaccine  Completed   Hepatitis C Screening  Completed   HPV Vaccine  Aged Out   COVID-19 Vaccine  Discontinued  *Topic was postponed. The date shown is not the original due date.    Advanced directives: (ACP Link)Information on Advanced Care Planning can be found at [facility] Directives Advance Health Care Directives ([email or web address]   Next Medicare Annual Wellness Visit scheduled for next year: Yes     04/20/24 @ 9:15 am by phone

## 2023-04-15 NOTE — Progress Notes (Signed)
Subjective:   Destiny Adams is a 72 y.o. female who presents for Medicare Annual (Subsequent) preventive examination.  Visit Complete: Virtual  I connected with  Destiny Adams on 04/15/23 by a audio enabled telemedicine application and verified that I am speaking with the correct person using two identifiers.  Patient Location: Home  Provider Location: Office/Clinic  I discussed the limitations of evaluation and management by telemedicine. The patient expressed understanding and agreed to proceed.  Vital Signs: Unable to obtain new vitals due to this being a telehealth visit.  Review of Systems     Cardiac Risk Factors include: advanced age (>61men, >50 women);dyslipidemia;hypertension;obesity (BMI >30kg/m2)     Objective:    There were no vitals filed for this visit. There is no height or weight on file to calculate BMI.     04/15/2023    9:35 AM 10/29/2020    9:04 AM 10/17/2019    1:04 PM 03/16/2018    8:33 AM 12/14/2017   10:40 AM 07/29/2016    9:57 AM  Advanced Directives  Does Patient Have a Medical Advance Directive? No No No No No No  Would patient like information on creating a medical advance directive? No - Patient declined   No - Patient declined Yes (MAU/Ambulatory/Procedural Areas - Information given) No - Patient declined    Current Medications (verified) Outpatient Encounter Medications as of 04/15/2023  Medication Sig   acetaminophen (TYLENOL) 650 MG CR tablet Take 650 mg by mouth every 8 (eight) hours as needed for pain.   amLODipine (NORVASC) 10 MG tablet Take 1 tablet (10 mg total) by mouth daily.   atorvastatin (LIPITOR) 20 MG tablet Take 1 tablet (20 mg total) by mouth daily.   calcitRIOL (ROCALTROL) 0.5 MCG capsule Take 1 capsule by mouth once daily   famotidine (PEPCID) 20 MG tablet Take 1 tablet (20 mg total) by mouth daily.   ferrous sulfate 324 MG TBEC Take 324 mg by mouth.   losartan (COZAAR) 50 MG tablet Take 2 tablets (100 mg total) by mouth  daily.   metoprolol succinate (TOPROL-XL) 100 MG 24 hr tablet TAKE 1 TABLET BY MOUTH ONCE DAILY TAKE  WITH  OR  IMMEDIATELY  FOLLOWING  A  MEAL   No facility-administered encounter medications on file as of 04/15/2023.    Allergies (verified) Patient has no known allergies.   History: Past Medical History:  Diagnosis Date   Anemia    CKD (chronic kidney disease) stage 4, GFR 15-29 ml/min (HCC) 07/23/2017   GERD (gastroesophageal reflux disease)    Hyperlipidemia    Hypertension    Severe obesity (BMI 35.0-35.9 with comorbidity) (HCC) 07/23/2017   Comorbid CKD stage IV, Hypertension, Hyperlipidemia   Past Surgical History:  Procedure Laterality Date   COLONOSCOPY WITH PROPOFOL N/A 07/29/2016   Procedure: COLONOSCOPY WITH PROPOFOL;  Surgeon: Earline Mayotte, MD;  Location: [facility] ENDOSCOPY;  Service: Endoscopy;  Laterality: N/A;   TUBAL LIGATION     Family History  Problem Relation Age of Onset   Colon cancer Maternal Uncle    Colon cancer Paternal Uncle    Breast cancer Paternal Aunt        90   Alzheimer's disease Mother    Cancer Father    Social History   Socioeconomic History   Marital status: Divorced    Spouse name: Not on file   Number of children: Not on file   Years of education: Not on file   Highest education level:  Not on file  Occupational History   Occupation: retired  Tobacco Use   Smoking status: Never   Smokeless tobacco: Never  Vaping Use   Vaping status: Never Used  Substance and Sexual Activity   Alcohol use: Not Currently   Drug use: Never   Sexual activity: Yes    Partners: Male  Other Topics Concern   Not on file  Social History Narrative   Not on file   Social Determinants of Health   Financial Resource Strain: Low Risk  (04/15/2023)   Overall Financial Resource Strain (CARDIA)    Difficulty of Paying Living Expenses: Not hard at all  Food Insecurity: No Food Insecurity (04/15/2023)   Hunger Vital Sign    Worried About Running Out  of Food in the Last Year: Never true    Ran Out of Food in the Last Year: Never true  Transportation Needs: No Transportation Needs (04/15/2023)   PRAPARE - Administrator, Civil Service (Medical): No    Lack of Transportation (Non-Medical): No  Physical Activity: Insufficiently Active (04/15/2023)   Exercise Vital Sign    Days of Exercise per Week: 4 days    Minutes of Exercise per Session: 30 min  Stress: No Stress Concern Present (04/15/2023)   [employer] of Occupational Health - Occupational Stress Questionnaire    Feeling of Stress : Not at all  Social Connections: Moderately Isolated (04/15/2023)   Social Connection and Isolation Panel [NHANES]    Frequency of Communication with Friends and Family: More than three times a week    Frequency of Social Gatherings with Friends and Family: Three times a week    Attends Religious Services: More than 4 times per year    Active Member of Clubs or Organizations: No    Attends Banker Meetings: Never    Marital Status: Divorced    Tobacco Counseling Counseling given: Not Answered   Clinical Intake:  Pre-visit preparation completed: Yes  Pain : No/denies pain     Nutritional Risks: None Diabetes: No  How often do you need to have someone help you when you read instructions, pamphlets, or other written materials from your doctor or pharmacy?: 1 - Never  Interpreter Needed?: No  Information entered by :: Destiny Bucker, LPN   Activities of Daily Living    04/15/2023    9:36 AM 01/07/2023    9:13 AM  In your present state of health, do you have any difficulty performing the following activities:  Hearing? 0 0  Vision? 0 0  Difficulty concentrating or making decisions? 0 0  Walking or climbing stairs? 0 0  Dressing or bathing? 0 0  Doing errands, shopping? 0 0  Preparing Food and eating ? N   Using the Toilet? N   In the past six months, have you accidently leaked urine? N   Do you have  problems with loss of bowel control? N   Managing your Medications? N   Managing your Finances? N   Housekeeping or managing your Housekeeping? N     Patient Care Team: Lorre Munroe, NP as PCP - General (Internal Medicine) Lemar Livings Merrily Pew, MD (General Surgery)  Indicate any recent Medical Services you may have received from other than [facility] providers in the past year (date may be approximate).     Assessment:   This is a routine wellness examination for Destiny Adams.  Hearing/Vision screen Hearing Screening - Comments:: No aids Vision Screening - Comments:: Wears glasses- Dr.Vanessa  Newton  Dietary issues and exercise activities discussed:     Goals Addressed             This Visit's Progress    DIET - EAT MORE FRUITS AND VEGETABLES         Depression Screen    04/15/2023    9:33 AM 01/07/2023    9:13 AM 01/05/2022    3:21 PM 09/03/2021   10:39 AM 10/29/2020    9:06 AM 10/17/2019    1:08 PM 07/19/2019    9:17 AM  PHQ 2/9 Scores  PHQ - 2 Score 0 2 0 0 0 0 0  PHQ- 9 Score 0 8  0       Fall Risk    04/15/2023    9:36 AM 01/07/2023    9:13 AM 01/05/2022    3:20 PM 09/03/2021   10:39 AM 10/29/2020    9:06 AM  Fall Risk   Falls in the past year? 0 0 0 0 0  Number falls in past yr: 0  0 0   Injury with Fall? 0 0 0 0   Risk for fall due to : No Fall Risks No Fall Risks No Fall Risks No Fall Risks Medication side effect  Follow up Falls prevention discussed;Falls evaluation completed  Falls evaluation completed Falls evaluation completed Falls evaluation completed;Education provided;Falls prevention discussed    MEDICARE RISK AT HOME: Medicare Risk at Home Any stairs in or around the home?: No If so, are there any without handrails?: No Home free of loose throw rugs in walkways, pet beds, electrical cords, etc?: Yes Adequate lighting in your home to reduce risk of falls?: Yes Life alert?: No Use of a cane, walker or w/c?: No Grab bars in the bathroom?: Yes Shower  chair or bench in shower?: Yes Elevated toilet seat or a handicapped toilet?: No  TIMED UP AND GO:  Was the test performed?  No    Cognitive Function:        04/15/2023    9:37 AM 01/05/2022    3:25 PM 10/29/2020    9:08 AM 12/14/2017   10:37 AM  6CIT Screen  What Year? 0 points 0 points 0 points 0 points  What month? 0 points 0 points 0 points 0 points  What time? 0 points 0 points 0 points 0 points  Count back from 20 0 points 0 points 0 points 0 points  Months in reverse 0 points 0 points 0 points 0 points  Repeat phrase 0 points 4 points 0 points 0 points  Total Score 0 points 4 points 0 points 0 points    Immunizations Immunization History  Administered Date(s) Administered   Hepatitis A, Adult 04/26/2018   Hepatitis B, ADULT 04/26/2018, 05/27/2018, 04/26/2019   Moderna Sars-Covid-2 Vaccination 09/27/2019, 10/18/2019, 06/10/2020   PNEUMOCOCCAL CONJUGATE-20 09/03/2021   Pneumococcal Conjugate-13 12/14/2017, 04/25/2020    TDAP status: Due, Education has been provided regarding the importance of this vaccine. Advised may receive this vaccine at local pharmacy or Health Dept. Aware to provide a copy of the vaccination record if obtained from local pharmacy or Health Dept. Verbalized acceptance and understanding.  Flu Vaccine status: Declined, Education has been provided regarding the importance of this vaccine but patient still declined. Advised may receive this vaccine at local pharmacy or Health Dept. Aware to provide a copy of the vaccination record if obtained from local pharmacy or Health Dept. Verbalized acceptance and understanding.  Pneumococcal vaccine status: Up to date  Covid-19 vaccine status: Completed vaccines  Qualifies for Shingles Vaccine? Yes   Zostavax completed No   Shingrix Completed?: No.    Education has been provided regarding the importance of this vaccine. Patient has been advised to call insurance company to determine out of pocket expense if they  have not yet received this vaccine. Advised may also receive vaccine at local pharmacy or Health Dept. Verbalized acceptance and understanding.  Screening Tests Health Maintenance  Topic Date Due   DTaP/Tdap/Td (1 - Tdap) Never done   Zoster Vaccines- Shingrix (1 of 2) Never done   INFLUENZA VACCINE  03/18/2023   MAMMOGRAM  01/07/2024 (Originally 11/23/1999)   DEXA SCAN  01/07/2024 (Originally 11/23/2014)   Medicare Annual Wellness (AWV)  04/14/2024   Colonoscopy  07/29/2026   Pneumonia Vaccine 67+ Years old  Completed   Hepatitis C Screening  Completed   HPV VACCINES  Aged Out   COVID-19 Vaccine  Discontinued    Health Maintenance  Health Maintenance Due  Topic Date Due   DTaP/Tdap/Td (1 - Tdap) Never done   Zoster Vaccines- Shingrix (1 of 2) Never done   INFLUENZA VACCINE  03/18/2023    Colorectal cancer screening: Type of screening: Colonoscopy. Completed 07/29/16. Repeat every 10 years  Declined referral for mammogram  Bone Density status: Ordered 04/15/23. Pt provided with contact info and advised to call to schedule appt.  Lung Cancer Screening: (Low Dose CT Chest recommended if Age 23-80 years, 20 pack-year currently smoking OR have quit w/in 15years.) does not qualify.   Additional Screening:  Hepatitis C Screening: does qualify; Completed 03/22/18  Vision Screening: Recommended annual ophthalmology exams for early detection of glaucoma and other disorders of the eye. Is the patient up to date with their annual eye exam?  Yes  Who is the provider or what is the name of the office in which the patient attends annual eye exams? Dr.Newton If pt is not established with a provider, would they like to be referred to a provider to establish care? No .   Dental Screening: Recommended annual dental exams for proper oral hygiene   Community Resource Referral / Chronic Care Management: CRR required this visit?  No   CCM required this visit?  No     Plan:     I have  personally reviewed and noted the following in the patient's chart:   Medical and social history Use of alcohol, tobacco or illicit drugs  Current medications and supplements including opioid prescriptions. Patient is not currently taking opioid prescriptions. Functional ability and status Nutritional status Physical activity Advanced directives List of other physicians Hospitalizations, surgeries, and ER visits in previous 12 months Vitals Screenings to include cognitive, depression, and falls Referrals and appointments  In addition, I have reviewed and discussed with patient certain preventive protocols, quality metrics, and best practice recommendations. A written personalized care plan for preventive services as well as general preventive health recommendations were provided to patient.     Hal Hope, LPN   9/52/[id number]   After Visit Summary: (MyChart) Due to this being a telephonic visit, the after visit summary with patients personalized plan was offered to patient via MyChart   Nurse Notes: none

## 2023-04-16 ENCOUNTER — Other Ambulatory Visit: Payer: Self-pay | Admitting: Internal Medicine

## 2023-04-16 DIAGNOSIS — I120 Hypertensive chronic kidney disease with stage 5 chronic kidney disease or end stage renal disease: Secondary | ICD-10-CM

## 2023-04-16 NOTE — Telephone Encounter (Signed)
Requested Prescriptions  Pending Prescriptions Disp Refills   amLODipine (NORVASC) 10 MG tablet [Pharmacy Med Name: amLODIPine Besylate 10 MG Oral Tablet] 90 tablet 0    Sig: Take 1 tablet by mouth once daily     Cardiovascular: Calcium Channel Blockers 2 Failed - 04/16/2023  8:57 AM      Failed - Last BP in normal range    BP Readings from Last 1 Encounters:  01/21/23 (!) 151/85         Passed - Last Heart Rate in normal range    Pulse Readings from Last 1 Encounters:  01/21/23 82         Passed - Valid encounter within last 6 months    Recent Outpatient Visits           2 months ago Primary hypertension   [place] [facility] [place], Salvadore Oxford, NP   3 months ago Encounter for general adult medical examination with abnormal findings   [place] [facility] [place], [place] W, NP   10 months ago CKD stage 5 secondary to hypertension [facility])   [place] [facility] [place], Salvadore Oxford, NP   1 year ago Encounter for general adult medical examination with abnormal findings   [place] [facility] [place], [place] W, NP   2 years ago Class 2 severe obesity due to excess calories with serious comorbidity and body mass index (BMI) of 35.0 to 35.9 in adult [facility])    [facility] [place], Salvadore Oxford, [place]

## 2023-04-28 ENCOUNTER — Other Ambulatory Visit: Payer: Self-pay | Admitting: Internal Medicine

## 2023-04-28 DIAGNOSIS — N185 Chronic kidney disease, stage 5: Secondary | ICD-10-CM

## 2023-04-29 NOTE — Telephone Encounter (Signed)
Requested medication (s) are due for refill today: Yes  Requested medication (s) are on the active medication list: Yes  Last refill:  01/28/23  Future visit scheduled: No  Notes to clinic:  Unable to refill per protocol due to failed labs, no updated results.      Requested Prescriptions  Pending Prescriptions Disp Refills   calcitRIOL (ROCALTROL) 0.5 MCG capsule [Pharmacy Med Name: Calcitriol 0.5 MCG Oral Capsule] 90 capsule 0    Sig: Take 1 capsule by mouth once daily     Endocrinology:  Vitamins - Vitamin D Supplementation - calcitriol Failed - 04/28/2023  3:06 PM      Failed - Phosphate in normal range and within 360 days    No results found for: "PHOS"       Failed - PTH in normal range and within 360 days    No results found for: "IOPTH", "PTHINTACTFNA", "PTH"       Failed - Ca in normal range and within 360 days    Calcium  Date Value Ref Range Status  01/07/2023 8.4 (L) 8.6 - 10.4 mg/dL Final         Passed - Valid encounter within last 12 months    Recent Outpatient Visits           3 months ago Primary hypertension   [place] [facility] [place], Salvadore Oxford, NP   3 months ago Encounter for general adult medical examination with abnormal findings   [place] [facility] [place], [place] W, NP   10 months ago CKD stage 5 secondary to hypertension [facility])   [place] [facility] [place], Salvadore Oxford, NP   1 year ago Encounter for general adult medical examination with abnormal findings   [place] [facility] [place], [place] W, NP   2 years ago Class 2 severe obesity due to excess calories with serious comorbidity and body mass index (BMI) of 35.0 to 35.9 in adult [facility])   [place] [facility] [place], Salvadore Oxford, [place]

## 2023-05-19 ENCOUNTER — Other Ambulatory Visit: Payer: Self-pay | Admitting: Internal Medicine

## 2023-05-19 DIAGNOSIS — Z0001 Encounter for general adult medical examination with abnormal findings: Secondary | ICD-10-CM

## 2023-05-20 NOTE — Telephone Encounter (Signed)
Requested Prescriptions  Pending Prescriptions Disp Refills   losartan (COZAAR) 50 MG tablet [Pharmacy Med Name: Losartan Potassium 50 MG Oral Tablet] 180 tablet 0    Sig: Take 2 tablets by mouth once daily     Cardiovascular:  Angiotensin Receptor Blockers Failed - 05/19/2023 12:02 PM      Failed - Cr in normal range and within 180 days    Creat  Date Value Ref Range Status  01/07/2023 6.15 (H) 0.60 - 1.00 mg/dL Final   Creatinine, Urine  Date Value Ref Range Status  03/16/2018 166 mg/dL Final         Failed - Last BP in normal range    BP Readings from Last 1 Encounters:  01/21/23 (!) 151/85         Passed - K in normal range and within 180 days    Potassium  Date Value Ref Range Status  01/07/2023 4.5 3.5 - 5.3 mmol/L Final         Passed - Patient is not pregnant      Passed - Valid encounter within last 6 months    Recent Outpatient Visits           3 months ago Primary hypertension   [place] [facility] [place], Salvadore Oxford, NP   4 months ago Encounter for general adult medical examination with abnormal findings   [place] [facility] [place], Salv[place]ore Oxford, NP   11 months ago CKD stage 5 secondary to hypertension [facility])   [place] [facility] [place], Salvadore Oxford, NP   1 year ago Encounter for general adult medical examination with abnormal findings   [place] [facility] [place], [place] W, NP   2 years ago Class 2 severe obesity due to excess calories with serious comorbidity and body mass index (BMI) of 35.0 to 35.9 in adult [facility])   [place] [facility] [place], Salvadore Oxford, [place]

## 2023-06-23 ENCOUNTER — Other Ambulatory Visit: Payer: Self-pay | Admitting: Internal Medicine

## 2023-06-23 DIAGNOSIS — Z0001 Encounter for general adult medical examination with abnormal findings: Secondary | ICD-10-CM

## 2023-06-24 NOTE — Telephone Encounter (Signed)
Patient will need to schedule an office visit for further refills. Requested Prescriptions  Pending Prescriptions Disp Refills   metoprolol succinate (TOPROL-XL) 100 MG 24 hr tablet [Pharmacy Med Name: Metoprolol Succinate ER 100 MG Oral Tablet Extended Release 24 Hour] 90 tablet 0    Sig: TAKE 1 TABLET BY MOUTH ONCE DAILY TAKE WITH OR IMMEDIATELY FOLLOWING A MEAL     Cardiovascular:  Beta Blockers Failed - 06/23/2023  9:35 AM      Failed - Last BP in normal range    BP Readings from Last 1 Encounters:  01/21/23 (!) 151/85         Passed - Last Heart Rate in normal range    Pulse Readings from Last 1 Encounters:  01/21/23 82         Passed - Valid encounter within last 6 months    Recent Outpatient Visits           5 months ago Primary hypertension   [place] [facility] [place], Salvadore Oxford, NP   5 months ago Encounter for general adult medical examination with abnormal findings   [place] [facility] [place], Salvadore Oxford, NP   1 year ago CKD stage 5 secondary to hypertension [facility])   [place] [facility] [place], Salvadore Oxford, NP   1 year ago Encounter for general adult medical examination with abnormal findings   [place] [facility] [place], [place] W, NP   2 years ago Class 2 severe obesity due to excess calories with serious comorbidity and body mass index (BMI) of 35.0 to 35.9 in adult [facility])    [facility] [place], Salv[place]ore [place], [place]

## 2023-06-30 DIAGNOSIS — H35033 Hypertensive retinopathy, bilateral: Secondary | ICD-10-CM | POA: Diagnosis not present

## 2023-06-30 DIAGNOSIS — H401131 Primary open-angle glaucoma, bilateral, mild stage: Secondary | ICD-10-CM | POA: Diagnosis not present

## 2023-06-30 DIAGNOSIS — H524 Presbyopia: Secondary | ICD-10-CM | POA: Diagnosis not present

## 2023-07-07 DIAGNOSIS — H401131 Primary open-angle glaucoma, bilateral, mild stage: Secondary | ICD-10-CM | POA: Diagnosis not present

## 2023-07-28 ENCOUNTER — Other Ambulatory Visit: Payer: Self-pay

## 2023-08-09 ENCOUNTER — Other Ambulatory Visit: Payer: Self-pay | Admitting: Internal Medicine

## 2023-08-09 DIAGNOSIS — I120 Hypertensive chronic kidney disease with stage 5 chronic kidney disease or end stage renal disease: Secondary | ICD-10-CM

## 2023-08-10 NOTE — Telephone Encounter (Signed)
Requested Prescriptions  Pending Prescriptions Disp Refills   calcitRIOL (ROCALTROL) 0.5 MCG capsule [Pharmacy Med Name: Calcitriol 0.5 MCG Oral Capsule] 30 capsule 0    Sig: Take 1 capsule by mouth once daily     Endocrinology:  Vitamins - Vitamin D Supplementation - calcitriol Failed - 08/10/2023  1:41 PM      Failed - Phosphate in normal range and within 360 days    No results found for: "PHOS"       Failed - PTH in normal range and within 360 days    No results found for: "IOPTH", "PTHINTACTFNA", "PTH"       Failed - Ca in normal range and within 360 days    Calcium  Date Value Ref Range Status  01/07/2023 8.4 (L) 8.6 - 10.4 mg/dL Final         Passed - Valid encounter within last 12 months    Recent Outpatient Visits           6 months ago Primary hypertension   [place] [facility] [place], Salvadore Oxford, NP   7 months ago Encounter for general adult medical examination with abnormal findings   [place] [facility] [place], Salvadore Oxford, NP   1 year ago CKD stage 5 secondary to hypertension [facility]   [place] [facility] [place], Salvadore Oxford, NP   1 year ago Encounter for general adult medical examination with abnormal findings   [place] [facility] [place], [place], NP   2 years ago Class 2 severe obesity due to excess calories with serious comorbidity and body mass index (BMI) of 35.0 to 35.9 in adult [facility])   [place] [facility] [place], Salvadore Oxford, NP       Future Appointments             In 2 weeks Lorre Munroe, NP [place] [facility]             amLODipine (NORVASC) 10 MG tablet [Pharmacy Med Name: amLODIPine Besylate 10 MG Oral Tablet] 30 tablet 0    Sig: Take 1 tablet by mouth once daily     Cardiovascular: Calcium Channel Blockers 2 Failed - 08/10/2023  1:41 PM      Failed - Last BP in normal range    BP Readings from Last 1 Encounters:  01/21/23 (!)  151/85         Passed - Last Heart Rate in normal range    Pulse Readings from Last 1 Encounters:  01/21/23 82         Passed - Valid encounter within last 6 months    Recent Outpatient Visits           6 months ago Primary hypertension   [place] [facility] [place], Salvadore Oxford, NP   7 months ago Encounter for general adult medical examination with abnormal findings   [place] [facility] [place], Salvadore Oxford, NP   1 year ago CKD stage 5 secondary to hypertension [facility])   [place] [facility] [place], [place], NP   1 year ago Encounter for general adult medical examination with abnormal findings    [facility] [place], [place], NP   2 years ago Class 2 severe obesity due to excess calories with serious comorbidity and body mass index (BMI) of 35.0 to 35.9 in adult [facility])  [place] [facility] [place], Salvadore Oxford, NP       Future Appointments             In 2 weeks [place], Salvadore Oxford, NP [place] [facility], [place]

## 2023-08-25 ENCOUNTER — Ambulatory Visit (INDEPENDENT_AMBULATORY_CARE_PROVIDER_SITE_OTHER): Payer: 59 | Admitting: Internal Medicine

## 2023-08-25 ENCOUNTER — Encounter: Payer: Self-pay | Admitting: Internal Medicine

## 2023-08-25 VITALS — BP 138/82 | Ht 62.0 in | Wt 174.6 lb

## 2023-08-25 DIAGNOSIS — Z0001 Encounter for general adult medical examination with abnormal findings: Secondary | ICD-10-CM | POA: Diagnosis not present

## 2023-08-25 DIAGNOSIS — E6609 Other obesity due to excess calories: Secondary | ICD-10-CM

## 2023-08-25 DIAGNOSIS — N185 Chronic kidney disease, stage 5: Secondary | ICD-10-CM

## 2023-08-25 DIAGNOSIS — I120 Hypertensive chronic kidney disease with stage 5 chronic kidney disease or end stage renal disease: Secondary | ICD-10-CM

## 2023-08-25 DIAGNOSIS — I10 Essential (primary) hypertension: Secondary | ICD-10-CM | POA: Diagnosis not present

## 2023-08-25 DIAGNOSIS — D631 Anemia in chronic kidney disease: Secondary | ICD-10-CM | POA: Diagnosis not present

## 2023-08-25 DIAGNOSIS — M1A3710 Chronic gout due to renal impairment, right ankle and foot, without tophus (tophi): Secondary | ICD-10-CM

## 2023-08-25 DIAGNOSIS — N2581 Secondary hyperparathyroidism of renal origin: Secondary | ICD-10-CM

## 2023-08-25 DIAGNOSIS — K219 Gastro-esophageal reflux disease without esophagitis: Secondary | ICD-10-CM

## 2023-08-25 DIAGNOSIS — E66811 Obesity, class 1: Secondary | ICD-10-CM

## 2023-08-25 DIAGNOSIS — R739 Hyperglycemia, unspecified: Secondary | ICD-10-CM | POA: Diagnosis not present

## 2023-08-25 DIAGNOSIS — Z6831 Body mass index (BMI) 31.0-31.9, adult: Secondary | ICD-10-CM

## 2023-08-25 DIAGNOSIS — E782 Mixed hyperlipidemia: Secondary | ICD-10-CM

## 2023-08-25 MED ORDER — AMLODIPINE BESYLATE 10 MG PO TABS: 10.0000 mg | ORAL_TABLET | Freq: Every day | ORAL | 1 refills | Status: DC

## 2023-08-25 MED ORDER — ATORVASTATIN CALCIUM 20 MG PO TABS: 20.0000 mg | ORAL_TABLET | Freq: Every day | ORAL | 1 refills | Status: AC

## 2023-08-25 MED ORDER — CALCITRIOL 0.5 MCG PO CAPS: 0.5000 ug | ORAL_CAPSULE | Freq: Every day | ORAL | 1 refills | Status: DC

## 2023-08-25 MED ORDER — FAMOTIDINE 20 MG PO TABS: 20.0000 mg | ORAL_TABLET | Freq: Every day | ORAL | 1 refills | Status: DC

## 2023-08-25 MED ORDER — METOPROLOL SUCCINATE ER 100 MG PO TB24: 100.0000 mg | ORAL_TABLET | Freq: Every day | ORAL | 1 refills | Status: DC

## 2023-08-25 MED ORDER — LOSARTAN POTASSIUM 50 MG PO TABS: 100.0000 mg | ORAL_TABLET | Freq: Every day | ORAL | 1 refills | Status: DC

## 2023-08-25 NOTE — Assessment & Plan Note (Signed)
 Encourage diet and exercise for weight loss

## 2023-08-25 NOTE — Progress Notes (Signed)
 Subjective:    Patient ID: Destiny Adams, female    DOB: 1951/07/16, 73 y.o.   MRN: [id number]  HPI  Patient presents to clinic today for follow-up of chronic conditions.  HTN: Her BP today is 138/82.  She is taking amlodipine , losartan  and metoprolol  as prescribed.  There is no ECG on file.  HLD: Her last LDL was 81, triglycerides 85, 12/2022.  She denies myalgias on atorvastatin .  She tries to consume a low-fat diet.  GERD: Triggered by greasy and acidic food.  She takes famotidine  only as needed.  There is no upper GI on file.  Hyperparathyroidism: Secondary to CKD.  She takes calcitriol  and sodium bicarb as prescribed.  She  does not follow with endocrinology.  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  She is on losartan  for renal protection.  She does not follow with nephrology.  Anemia of CKD: Her last H/H was 10.6/33, 12/2022.  She is taking oral iron at this time.  She does not follow with hematology.  Gout: She denies recent flare. She does not take any preventative medication for this. She does not follow with rheumatology.  Review of Systems     Past Medical History:  Diagnosis Date   Anemia    CKD (chronic kidney disease) stage 4, GFR 15-29 ml/min (HCC) 07/23/2017   GERD (gastroesophageal reflux disease)    Hyperlipidemia    Hypertension    Severe obesity (BMI 35.0-35.9 with comorbidity) (HCC) 07/23/2017   Comorbid CKD stage IV, Hypertension, Hyperlipidemia    Current Outpatient Medications  Medication Sig Dispense Refill   acetaminophen (TYLENOL) 650 MG CR tablet Take 650 mg by mouth every 8 (eight) hours as needed for pain.     amLODipine  (NORVASC ) 10 MG tablet Take 1 tablet by mouth once daily 30 tablet 0   atorvastatin  (LIPITOR) 20 MG tablet Take 1 tablet (20 mg total) by mouth daily. 90 tablet 1   calcitRIOL  (ROCALTROL ) 0.5 MCG capsule Take 1 capsule by mouth once daily 30 capsule 0   famotidine  (PEPCID ) 20 MG tablet Take 1 tablet (20 mg total) by  mouth daily. 90 tablet 1   ferrous sulfate 324 MG TBEC Take 324 mg by mouth.     losartan  (COZAAR ) 50 MG tablet Take 2 tablets by mouth once daily 180 tablet 0   metoprolol  succinate (TOPROL -XL) 100 MG 24 hr tablet TAKE 1 TABLET BY MOUTH ONCE DAILY TAKE WITH OR IMMEDIATELY FOLLOWING A MEAL 90 tablet 0   No current facility-administered medications for this visit.    No Known Allergies  Family History  Problem Relation Age of Onset   Colon cancer Maternal Uncle    Colon cancer Paternal Uncle    Breast cancer Paternal Aunt        30   Alzheimer's disease Mother    Cancer Father     Social History   Socioeconomic History   Marital status: Divorced    Spouse name: Not on file   Number of children: Not on file   Years of education: Not on file   Highest education level: Not on file  Occupational History   Occupation: retired  Tobacco Use   Smoking status: Never   Smokeless tobacco: Never  Vaping Use   Vaping status: Never Used  Substance and Sexual Activity   Alcohol use: Not Currently   Drug use: Never   Sexual activity: Yes    Partners: Male  Other Topics Concern  Not on file  Social History Narrative   Not on file   Social Drivers of Health   Financial Resource Strain: Low Risk  (04/15/2023)   Overall Financial Resource Strain (CARDIA)    Difficulty of Paying Living Expenses: Not hard at all  Food Insecurity: No Food Insecurity (04/15/2023)   Hunger Vital Sign    Worried About Running Out of Food in the Last Year: Never true    Ran Out of Food in the Last Year: Never true  Transportation Needs: No Transportation Needs (04/15/2023)   PRAPARE - Administrator, Civil Service (Medical): No    Lack of Transportation (Non-Medical): No  Physical Activity: Insufficiently Active (04/15/2023)   Exercise Vital Sign    Days of Exercise per Week: 4 days    Minutes of Exercise per Session: 30 min  Stress: No Stress Concern Present (04/15/2023)   [employer]  of Occupational Health - Occupational Stress Questionnaire    Feeling of Stress : Not at all  Social Connections: Moderately Isolated (04/15/2023)   Social Connection and Isolation Panel [NHANES]    Frequency of Communication with Friends and Family: More than three times a week    Frequency of Social Gatherings with Friends and Family: Three times a week    Attends Religious Services: More than 4 times per year    Active Member of Clubs or Organizations: No    Attends Banker Meetings: Never    Marital Status: Divorced  Catering Manager Violence: Not At Risk (04/15/2023)   Humiliation, Afraid, Rape, and Kick questionnaire    Fear of Current or Ex-Partner: No    Emotionally Abused: No    Physically Abused: No    Sexually Abused: No     Constitutional: Denies fever, malaise, fatigue, headache or abrupt weight changes.  HEENT: Denies eye pain, eye redness, ear pain, ringing in the ears, wax buildup, runny nose, nasal congestion, bloody nose, or sore throat. Respiratory: Denies difficulty breathing, shortness of breath, cough or sputum production.   Cardiovascular: Denies chest pain, chest tightness, palpitations or swelling in the hands or feet.  Gastrointestinal: Denies abdominal pain, bloating, constipation, diarrhea or blood in the stool.  GU: Denies urgency, frequency, pain with urination, burning sensation, blood in urine, odor or discharge. Musculoskeletal: Denies decrease in range of motion, difficulty with gait, muscle pain, joint pain or swelling.  Skin: Denies redness, rashes, lesions or ulcercations.  Neurological: Denies dizziness, difficulty with memory, difficulty with speech or problems with balance and coordination.  Psych: Denies anxiety, depression, SI/HI.  No other specific complaints in a complete review of systems (except as listed in HPI above).  Objective:   Physical Exam  BP 138/82 (BP Location: Right Arm, Patient Position: Sitting, Cuff Size:  Normal)   Ht 5' 2 (1.575 m)   Wt 174 lb 9.6 oz (79.2 kg)   BMI 31.93 kg/m    Wt Readings from Last 3 Encounters:  01/21/23 181 lb (82.1 kg)  01/07/23 182 lb (82.6 kg)  06/09/22 190 lb (86.2 kg)    General: Appears her stated age, obese, in NAD. Skin: Warm, dry and intact. HEENT: Head: normal shape and size; Eyes: sclera white, no icterus, conjunctiva pink, PERRLA and EOMs intact;  Cardiovascular: Normal rate and rhythm. S1,S2 noted.  No murmur, rubs or gallops noted. No JVD or BLE edema. No carotid bruits noted. Pulmonary/Chest: Normal effort and positive vesicular breath sounds. No respiratory distress. No wheezes, rales or ronchi noted.  Abdomen:  Normal bowel sounds.  Musculoskeletal:No difficulty with gait.  Neurological: Alert and oriented.    BMET    Component Value Date/Time   NA 139 01/07/2023 0858   K 4.5 01/07/2023 0858   CL 108 01/07/2023 0858   CO2 19 (L) 01/07/2023 0858   GLUCOSE 100 (H) 01/07/2023 0858   BUN 55 (H) 01/07/2023 0858   CREATININE 6.15 (H) 01/07/2023 0858   CALCIUM  8.4 (L) 01/07/2023 0858   GFRNONAA 14 (L) 04/25/2020 0957   GFRAA 17 (L) 04/25/2020 0957    Lipid Panel     Component Value Date/Time   CHOL 162 01/07/2023 0858   TRIG 85 01/07/2023 0858   HDL 55 01/07/2023 0858   CHOLHDL 2.9 01/07/2023 0858   LDLCALC 89 01/07/2023 0858    CBC    Component Value Date/Time   WBC 9.3 01/07/2023 0858   RBC 3.44 (L) 01/07/2023 0858   HGB 10.6 (L) 01/07/2023 0858   HCT 33.0 (L) 01/07/2023 0858   PLT 283 01/07/2023 0858   MCV 95.9 01/07/2023 0858   MCH 30.8 01/07/2023 0858   MCHC 32.1 01/07/2023 0858   RDW 12.9 01/07/2023 0858   LYMPHSABS 4,015 (H) 05/15/2020 0838   MONOABS 1.1 (H) 03/16/2018 0801   EOSABS 946 (H) 05/15/2020 0838   BASOSABS 66 05/15/2020 0838    Hgb A1C Lab Results  Component Value Date   HGBA1C 5.3 01/07/2023           Assessment & Plan:     RTC in 4 months for your annual exam Angeline Laura, NP

## 2023-08-25 NOTE — Patient Instructions (Signed)
Chronic Kidney Disease, Adult Chronic kidney disease is when lasting damage happens to the kidneys slowly over a long time. The kidneys help to: Make pee (urine). Make hormones. Keep the right amount of fluids and chemicals in the body. Most often, this disease does not go away. You must take steps to help keep the kidney damage from getting worse. If steps are not taken, the kidneys might stop working forever. What are the causes? Diabetes. High blood pressure. Diseases that affect the heart and blood vessels. Other kidney diseases. Diseases of the body's disease-fighting system. A problem with the flow of pee. Infections of the organs that make pee, store it, and take it out of the body. Swelling or irritation of your blood vessels. What increases the risk? Getting older. Having someone in your family who has kidney disease or kidney failure. Having a disease caused by genes. Taking medicines often that harm the kidneys. Being near or having contact with harmful substances. Being very overweight. Using tobacco now or in the past. What are the signs or symptoms? Feeling very tired. Having a swollen face, legs, ankles, or feet. Feeling like you may vomit or vomiting. Not feeling hungry. Being confused or not able to focus. Twitches and cramps in the leg muscles or other muscles. Dry, itchy skin. A taste of metal in your mouth. Making less pee, or making more pee. Shortness of breath. Trouble sleeping. You may also become anemic or get weak bones. Anemic means there is not enough red blood cells or hemoglobin in your blood. You may get symptoms slowly. You may not notice them until the kidney damage gets very bad. How is this treated? Often, there is no cure for this disease. Treatment can help with symptoms and help keep the disease from getting worse. You may need to: Avoid alcohol. Avoid foods that are high in salt, potassium, phosphorous, and protein. Take medicines for  symptoms and to help control other conditions. Have dialysis. This treatment gets harmful waste out of your body. Treat other problems that cause your kidney disease or make it worse. Follow these instructions at home: Medicines Take over-the-counter and prescription medicines only as told by your doctor. Do not take any new medicines, vitamins, or supplements unless your doctor says it is okay. Lifestyle  Do not smoke or use any products that contain nicotine or tobacco. If you need help quitting, ask your doctor. If you drink alcohol: Limit how much you use to: 0-1 drink a day for women who are not pregnant. 0-2 drinks a day for men. Know how much alcohol is in your drink. In the [place] one drink equals one 12 oz bottle of beer (355 mL), one 5 oz glass of wine (148 mL), or one 1 oz glass of hard liquor (44 mL). Stay at a healthy weight. If you need help losing weight, ask your doctor. General instructions  Follow instructions from your doctor about what you cannot eat or drink. Track your blood pressure at home. Tell your doctor about any changes. If you have diabetes, track your blood sugar. Exercise at least 30 minutes a day, 5 days a week. Keep your shots (vaccinations) up to date. Keep all follow-up visits. Where to find more information American Association of Kidney Patients: www.aakp.org National Kidney Foundation: www.kidney.org American Kidney Fund: www.akfinc.org Life Options: www.lifeoptions.org Kidney School: www.kidneyschool.org Contact a doctor if: Your symptoms get worse. You get new symptoms. Get help right away if: You get symptoms of end-stage kidney disease. These   include: Headaches. Losing feeling in your hands or feet. Easy bruising. Having hiccups often. Chest pain. Shortness of breath. Lack of menstrual periods, in women. You have a fever. You make less pee than normal. You have pain or you bleed when you pee or poop. These symptoms may be an  emergency. Get help right away. Call your local emergency services (911 in the [place] Do not wait to see if the symptoms will go away. Do not drive yourself to the hospital. Summary Chronic kidney disease is when lasting damage happens to the kidneys slowly over a long time. Causes of this disease include diabetes and high blood pressure. Often, there is no cure for this disease. Treatment can help symptoms and help keep the disease from getting worse. Treatment may involve lifestyle changes, medicines, and dialysis. This information is not intended to replace advice given to you by your health care provider. Make sure you discuss any questions you have with your health care provider. Document Revised: 11/08/2019 Document Reviewed: 11/08/2019 Elsevier Patient Education  2024 Elsevier Inc.  

## 2023-08-25 NOTE — Assessment & Plan Note (Signed)
 CBC today Continue iron

## 2023-08-25 NOTE — Assessment & Plan Note (Signed)
 Continue losartan, metoprolol and amlodipine CMET today Reinforced DASH diet and exercise for weight loss

## 2023-08-25 NOTE — Assessment & Plan Note (Signed)
C-Met and lipid profile today Encouraged her to consume a low-fat diet Continue atorvastatin 

## 2023-08-25 NOTE — Assessment & Plan Note (Signed)
Uric acid level today Reinforced low purine diet Avoid anti-inflammatories OTC

## 2023-08-25 NOTE — Assessment & Plan Note (Signed)
She reports that she does not have kidney disease and she has seen nephrology in the past and will not go back C-Met today

## 2023-08-25 NOTE — Assessment & Plan Note (Signed)
C-Met today Continue Calcitrol and sodium bicarb

## 2023-08-25 NOTE — Assessment & Plan Note (Signed)
 Avoid foods that trigger reflux Encourage weight loss as this can help reduce reflux symptoms Continue famotadine as needed

## 2023-08-26 LAB — CBC
HCT: 29.1 % — ABNORMAL LOW (ref 35.0–45.0)
Hemoglobin: 9.4 g/dL — ABNORMAL LOW (ref 11.7–15.5)
MCH: 30.4 pg (ref 27.0–33.0)
MCHC: 32.3 g/dL (ref 32.0–36.0)
MCV: 94.2 fL (ref 80.0–100.0)
MPV: 10.8 fL (ref 7.5–12.5)
Platelets: 277 10*3/uL (ref 140–400)
RBC: 3.09 10*6/uL — ABNORMAL LOW (ref 3.80–5.10)
RDW: 14 % (ref 11.0–15.0)
WBC: 7.8 10*3/uL (ref 3.8–10.8)

## 2023-08-26 LAB — LIPID PANEL
Cholesterol: 135 mg/dL (ref <200)
HDL: 61 mg/dL (ref >=50)
LDL Cholesterol (Calc): 61 mg/dL
Non-HDL Cholesterol (Calc): 74 mg/dL (ref <130)
Total CHOL/HDL Ratio: 2.2 (calc) (ref <5.0)
Triglycerides: 51 mg/dL (ref <150)

## 2023-08-26 LAB — COMPLETE METABOLIC PANEL WITH GFR
AG Ratio: 1.3 (calc) (ref 1.0–2.5)
ALT: 7 U/L (ref 6–29)
AST: 11 U/L (ref 10–35)
Albumin: 4.3 g/dL (ref 3.6–5.1)
Alkaline phosphatase (APISO): 90 U/L (ref 37–153)
BUN/Creatinine Ratio: 12 (calc) (ref 6–22)
BUN: 85 mg/dL — ABNORMAL HIGH (ref 7–25)
CO2: 16 mmol/L — ABNORMAL LOW (ref 20–32)
Calcium: 8.9 mg/dL (ref 8.6–10.4)
Chloride: 106 mmol/L (ref 98–110)
Creat: 7.11 mg/dL — ABNORMAL HIGH (ref 0.60–1.00)
Globulin: 3.2 g/dL (ref 1.9–3.7)
Glucose, Bld: 96 mg/dL (ref 65–99)
Potassium: 4.9 mmol/L (ref 3.5–5.3)
Sodium: 136 mmol/L (ref 135–146)
Total Bilirubin: 0.3 mg/dL (ref 0.2–1.2)
Total Protein: 7.5 g/dL (ref 6.1–8.1)
eGFR: 6 mL/min/{1.73_m2} — ABNORMAL LOW (ref >=60)

## 2023-08-26 LAB — HEMOGLOBIN A1C
Hgb A1c MFr Bld: 5.3 %{Hb} (ref <5.7)
Mean Plasma Glucose: 105 mg/dL
eAG (mmol/L): 5.8 mmol/L

## 2023-08-26 LAB — URIC ACID: Uric Acid, Serum: 7 mg/dL (ref 2.5–7.0)

## 2023-08-26 NOTE — Progress Notes (Signed)
 Patient notified of lab results. She stated she will hold off on the referral for now and call the office when she decides she wants the referral to the nephrologist.   Verbal understanding of all directions.

## 2023-09-13 ENCOUNTER — Telehealth: Payer: Self-pay | Admitting: Internal Medicine

## 2023-09-13 NOTE — Telephone Encounter (Signed)
[facility] called and spoke to Halley, [facility] about the refill(s) calcitriol requested. Advised it was sent on 08/25/23 #90/1 refill(s). She says it's been ready x 4 days. Patient called, phone rings and no answer, no voicemail.

## 2023-09-13 NOTE — Telephone Encounter (Signed)
Medication Refill -  Most Recent Primary Care Visit:  Provider: Lorre Munroe  Department: [facility]  Visit Type: OFFICE VISIT  Date: 08/25/2023  Medication: calcitRIOL (ROCALTROL) 0.5 MCG capsule [[id number]]  Pt is still taking med, she says she was given  a, 30 day supply previously, so now she is out Has the patient contacted their pharmacy? yes ((Agent: If yes, when and what did the pharmacy advise?)contact pcp  Is this the correct pharmacy for this prescription? yes  This is the patient's preferred pharmacy:  [facility] [address]  - [address] [place]) [place] [address] Phone: [phone] Fax: [phone]   Has the prescription been filled recently? yes  Is the patient out of the medication? yes  Has the patient been seen for an appointment in the last year OR does the patient have an upcoming appointment? yes  Can we respond through MyChart? yes  Agent: Please be advised that Rx refills may take up to 3 business days. We ask that you follow-up with your pharmacy.

## 2023-09-14 NOTE — Telephone Encounter (Signed)
Pt. Given message, verbalizes understanding.

## 2023-10-27 ENCOUNTER — Telehealth: Payer: Self-pay

## 2023-10-27 NOTE — Telephone Encounter (Signed)
 Copied from CRM [id number]. Topic: Clinical - Medication Question >> Oct 27, 2023  2:36 PM Destiny Adams wrote: Reason for CRM: Pt is not feeling well, has cough, stuffed up on the nose and would like an antibiotic. Pt would like to know if a medication could be sent to pharmacy. Pt [phone]

## 2024-01-19 ENCOUNTER — Ambulatory Visit: Payer: Self-pay | Admitting: Internal Medicine

## 2024-01-28 ENCOUNTER — Ambulatory Visit (INDEPENDENT_AMBULATORY_CARE_PROVIDER_SITE_OTHER): Admitting: Internal Medicine

## 2024-01-28 ENCOUNTER — Encounter: Payer: Self-pay | Admitting: Internal Medicine

## 2024-01-28 VITALS — BP 152/88 | Ht 62.0 in | Wt 162.6 lb

## 2024-01-28 DIAGNOSIS — I10 Essential (primary) hypertension: Secondary | ICD-10-CM

## 2024-01-28 DIAGNOSIS — J301 Allergic rhinitis due to pollen: Secondary | ICD-10-CM

## 2024-01-28 DIAGNOSIS — Z0001 Encounter for general adult medical examination with abnormal findings: Secondary | ICD-10-CM

## 2024-01-28 DIAGNOSIS — E782 Mixed hyperlipidemia: Secondary | ICD-10-CM | POA: Diagnosis not present

## 2024-01-28 DIAGNOSIS — R739 Hyperglycemia, unspecified: Secondary | ICD-10-CM

## 2024-01-28 DIAGNOSIS — E663 Overweight: Secondary | ICD-10-CM | POA: Diagnosis not present

## 2024-01-28 DIAGNOSIS — Z6829 Body mass index (BMI) 29.0-29.9, adult: Secondary | ICD-10-CM

## 2024-01-28 MED ORDER — AMLODIPINE BESYLATE 10 MG PO TABS: 10.0000 mg | ORAL_TABLET | Freq: Every day | ORAL | 1 refills | Status: DC

## 2024-01-28 MED ORDER — PREDNISONE 10 MG PO TABS: ORAL_TABLET | ORAL | 0 refills | Status: DC

## 2024-01-28 NOTE — Assessment & Plan Note (Signed)
 Uncontrolled but she ran out of amlodipine  10 mg daily, refilled today Encouraged her to continue losartan  100 mg and metoprolol  100 mg XR daily Reinforced DASH diet and exercise for weight loss

## 2024-01-28 NOTE — Patient Instructions (Signed)
 Health Maintenance for Postmenopausal Women Menopause is a normal process in which your ability to get pregnant comes to an end. This process happens slowly over many months or years, usually between the ages of 24 and 62. Menopause is complete when you have missed your menstrual period for 12 months. It is important to talk with your health care provider about some of the most common conditions that affect women after menopause (postmenopausal women). These include heart disease, cancer, and bone loss (osteoporosis). Adopting a healthy lifestyle and getting preventive care can help to promote your health and wellness. The actions you take can also lower your chances of developing some of these common conditions. What are the signs and symptoms of menopause? During menopause, you may have the following symptoms: Hot flashes. These can be moderate or severe. Night sweats. Decrease in sex drive. Mood swings. Headaches. Tiredness (fatigue). Irritability. Memory problems. Problems falling asleep or staying asleep. Talk with your health care provider about treatment options for your symptoms. Do I need hormone replacement therapy? Hormone replacement therapy is effective in treating symptoms that are caused by menopause, such as hot flashes and night sweats. Hormone replacement carries certain risks, especially as you become older. If you are thinking about using estrogen or estrogen with progestin, discuss the benefits and risks with your health care provider. How can I reduce my risk for heart disease and stroke? The risk of heart disease, heart attack, and stroke increases as you age. One of the causes may be a change in the body's hormones during menopause. This can affect how your body uses dietary fats, triglycerides, and cholesterol. Heart attack and stroke are medical emergencies. There are many things that you can do to help prevent heart disease and stroke. Watch your blood pressure High  blood pressure causes heart disease and increases the risk of stroke. This is more likely to develop in people who have high blood pressure readings or are overweight. Have your blood pressure checked: Every 3-5 years if you are 50-75 years of age. Every year if you are 77 years old or older. Eat a healthy diet  Eat a diet that includes plenty of vegetables, fruits, low-fat dairy products, and lean protein. Do not eat a lot of foods that are high in solid fats, added sugars, or sodium. Get regular exercise Get regular exercise. This is one of the most important things you can do for your health. Most adults should: Try to exercise for at least 150 minutes each week. The exercise should increase your heart rate and make you sweat (moderate-intensity exercise). Try to do strengthening exercises at least twice each week. Do these in addition to the moderate-intensity exercise. Spend less time sitting. Even light physical activity can be beneficial. Other tips Work with your health care provider to achieve or maintain a healthy weight. Do not use any products that contain nicotine or tobacco. These products include cigarettes, chewing tobacco, and vaping devices, such as e-cigarettes. If you need help quitting, ask your health care provider. Know your numbers. Ask your health care provider to check your cholesterol and your blood sugar (glucose). Continue to have your blood tested as directed by your health care provider. Do I need screening for cancer? Depending on your health history and family history, you may need to have cancer screenings at different stages of your life. This may include screening for: Breast cancer. Cervical cancer. Lung cancer. Colorectal cancer. What is my risk for osteoporosis? After menopause, you may be  at increased risk for osteoporosis. Osteoporosis is a condition in which bone destruction happens more quickly than new bone creation. To help prevent osteoporosis or  the bone fractures that can happen because of osteoporosis, you may take the following actions: If you are 61-3 years old, get at least 1,000 mg of calcium and at least 600 international units (IU) of vitamin D per day. If you are older than age 61 but younger than age 75, get at least 1,200 mg of calcium and at least 600 international units (IU) of vitamin D per day. If you are older than age 62, get at least 1,200 mg of calcium and at least 800 international units (IU) of vitamin D per day. Smoking and drinking excessive alcohol increase the risk of osteoporosis. Eat foods that are rich in calcium and vitamin D, and do weight-bearing exercises several times each week as directed by your health care provider. How does menopause affect my mental health? Depression may occur at any age, but it is more common as you become older. Common symptoms of depression include: Feeling depressed. Changes in sleep patterns. Changes in appetite or eating patterns. Feeling an overall lack of motivation or enjoyment of activities that you previously enjoyed. Frequent crying spells. Talk with your health care provider if you think that you are experiencing any of these symptoms. General instructions See your health care provider for regular wellness exams and vaccines. This may include: Scheduling regular health, dental, and eye exams. Getting and maintaining your vaccines. These include: Influenza vaccine. Get this vaccine each year before the flu season begins. Pneumonia vaccine. Shingles vaccine. Tetanus, diphtheria, and pertussis (Tdap) booster vaccine. Your health care provider may also recommend other immunizations. Tell your health care provider if you have ever been abused or do not feel safe at home. Summary Menopause is a normal process in which your ability to get pregnant comes to an end. This condition causes hot flashes, night sweats, decreased interest in sex, mood swings, headaches, or lack  of sleep. Treatment for this condition may include hormone replacement therapy. Take actions to keep yourself healthy, including exercising regularly, eating a healthy diet, watching your weight, and checking your blood pressure and blood sugar levels. Get screened for cancer and depression. Make sure that you are up to date with all your vaccines. This information is not intended to replace advice given to you by your health care provider. Make sure you discuss any questions you have with your health care provider. Document Revised: 12/23/2020 Document Reviewed: 12/23/2020 Elsevier Patient Education  2024 [employer].

## 2024-01-28 NOTE — Progress Notes (Signed)
 Subjective:    Patient ID: Destiny Adams, female    DOB: [birth year], 73 y.o.   MRN: [id number]  HPI  Patient presents to clinic today for her annual exam. Of note, her BP today is 152/88.  She reports she ran out of her amlodipine . She is still taking her losartan  and metoprolol .  She also reports headache, sinus pressure, runny nose and cough.  She reports that started 3 to 4 days ago.  She is blowing clear mucus out of her nose.  The cough is productive of clear mucus.  She denies ear pain, nasal congestion, sore throat, shortness of breath, nausea, vomiting or diarrhea.  She denies fever, chills or bodyaches.  She has tried Claritin  OTC with minimal relief of symptoms.  She has not had sick contacts that she is aware of.  Flu: never Tetanus: > 10 years ago COVID: Moderna x 3 Pneumovax: Never Prevnar 20: 08/2021 Shingrix: never Pap smear: no longer screening Mammogram: never Bone density: never Colon screening: 07/2016 Vision screening: annually Dentist: biannually  Diet: She does eat meat. She consumes fruits and veggies. She does eat some fried foods. She drinks mostly juice, some soda, water. Exercise: None  Review of Systems     Past Medical History:  Diagnosis Date   Anemia    CKD (chronic kidney disease) stage 4, GFR 15-29 ml/min (HCC) 07/23/2017   GERD (gastroesophageal reflux disease)    Hyperlipidemia    Hypertension    Severe obesity (BMI 35.0-35.9 with comorbidity) (HCC) 07/23/2017   Comorbid CKD stage IV, Hypertension, Hyperlipidemia    Current Outpatient Medications  Medication Sig Dispense Refill   acetaminophen (TYLENOL) 650 MG CR tablet Take 650 mg by mouth every 8 (eight) hours as needed for pain.     amLODipine  (NORVASC ) 10 MG tablet Take 1 tablet (10 mg total) by mouth daily. 90 tablet 1   atorvastatin  (LIPITOR) 20 MG tablet Take 1 tablet (20 mg total) by mouth daily. 90 tablet 1   calcitRIOL  (ROCALTROL ) 0.5 MCG capsule Take 1 capsule (0.5 mcg total)  by mouth daily. 90 capsule 1   famotidine  (PEPCID ) 20 MG tablet Take 1 tablet (20 mg total) by mouth daily. 90 tablet 1   ferrous sulfate 324 MG TBEC Take 324 mg by mouth.     losartan  (COZAAR ) 50 MG tablet Take 2 tablets (100 mg total) by mouth daily. 180 tablet 1   metoprolol  succinate (TOPROL -XL) 100 MG 24 hr tablet Take 1 tablet (100 mg total) by mouth daily. Take with or immediately following a meal. 90 tablet 1   No current facility-administered medications for this visit.    No Known Allergies  Family History  Problem Relation Age of Onset   Colon cancer Maternal Uncle    Colon cancer Paternal Uncle    Breast cancer Paternal Aunt        80   Alzheimer's disease Mother    Cancer Father     Social History   Socioeconomic History   Marital status: Divorced    Spouse name: Not on file   Number of children: Not on file   Years of education: Not on file   Highest education level: Not on file  Occupational History   Occupation: retired  Tobacco Use   Smoking status: Never   Smokeless tobacco: Never  Vaping Use   Vaping status: Never Used  Substance and Sexual Activity   Alcohol use: Not Currently   Drug use: Never   Sexual  activity: Yes    Partners: Male  Other Topics Concern   Not on file  Social History Narrative   Not on file   Social Drivers of Health   Financial Resource Strain: Low Risk  (04/15/2023)   Overall Financial Resource Strain (CARDIA)    Difficulty of Paying Living Expenses: Not hard at all  Food Insecurity: No Food Insecurity (04/15/2023)   Hunger Vital Sign    Worried About Running Out of Food in the Last Year: Never true    Ran Out of Food in the Last Year: Never true  Transportation Needs: No Transportation Needs (04/15/2023)   PRAPARE - Administrator, Civil Service (Medical): No    Lack of Transportation (Non-Medical): No  Physical Activity: Insufficiently Active (04/15/2023)   Exercise Vital Sign    Days of Exercise per Week:  4 days    Minutes of Exercise per Session: 30 min  Stress: No Stress Concern Present (04/15/2023)   [employer] of Occupational Health - Occupational Stress Questionnaire    Feeling of Stress : Not at all  Social Connections: Moderately Isolated (04/15/2023)   Social Connection and Isolation Panel    Frequency of Communication with Friends and Family: More than three times a week    Frequency of Social Gatherings with Friends and Family: Three times a week    Attends Religious Services: More than 4 times per year    Active Member of Clubs or Organizations: No    Attends Banker Meetings: Never    Marital Status: Divorced  Catering manager Violence: Not At Risk (04/15/2023)   Humiliation, Afraid, Rape, and Kick questionnaire    Fear of Current or Ex-Partner: No    Emotionally Abused: No    Physically Abused: No    Sexually Abused: No     Constitutional: Pt reports headache. Denies fever, malaise, fatigue, or abrupt weight changes.  HEENT: Pt reports sinus pressure and runny nose. Denies eye pain, eye redness, ear pain, ringing in the ears, wax buildup, nasal congestion, bloody nose, or sore throat. Respiratory: Pt reports cough. Denies difficulty breathing, shortness of breath.   Cardiovascular: Denies chest pain, chest tightness, palpitations or swelling in the hands or feet.  Gastrointestinal: Denies abdominal pain, bloating, constipation, diarrhea or blood in the stool.  GU: Denies urgency, frequency, pain with urination, burning sensation, blood in urine, odor or discharge. Musculoskeletal: Denies decrease in range of motion, difficulty with gait, muscle pain or joint pain and swelling.  Skin: Denies redness, rashes, lesions or ulcercations.  Neurological: Denies dizziness, difficulty with memory, difficulty with speech or problems with balance and coordination.  Psych: Denies anxiety, depression, SI/HI.  No other specific complaints in a complete review of systems  (except as listed in HPI above).  Objective:   Physical Exam  BP (!) 152/88 (BP Location: Left Arm, Patient Position: Sitting, Cuff Size: Normal)   Ht 5' 2 (1.575 m)   Wt 162 lb 9.6 oz (73.8 kg)   BMI 29.74 kg/m    Wt Readings from Last 3 Encounters:  08/25/23 174 lb 9.6 oz (79.2 kg)  01/21/23 181 lb (82.1 kg)  01/07/23 182 lb (82.6 kg)    General: Appears her stated age, obese, in NAD. Skin: Warm, dry and intact.  Hypopigmented area noted of her nose and face. HEENT: Head: normal shape and size, no sinus tenderness noted.; Eyes: sclera white, no icterus, conjunctiva pink, PERRLA and EOMs intact; Nose: mucosa boggy and moist, turbinates swollen; Throat:  mucosa pink and moist, + PND. Neck:  Neck supple, trachea midline. No masses, lumps or thyromegaly present.  Cardiovascular: Normal rate and rhythm. S1,S2 noted.  No murmur, rubs or gallops noted. No JVD or BLE edema. No carotid bruits noted. Pulmonary/Chest: Normal effort and positive vesicular breath sounds. No respiratory distress. No wheezes, rales or ronchi noted.  Abdomen: Soft and nontender. Normal bowel sounds.  Musculoskeletal: Strength 5/5 BUE/BLE. No difficulty with gait.  Neurological: Alert and oriented. Cranial nerves II-XII grossly intact. Coordination normal.  Psychiatric: Mood and affect normal. Behavior is normal. Judgment and thought content normal.     BMET    Component Value Date/Time   NA 136 08/25/2023 1038   K 4.9 08/25/2023 1038   CL 106 08/25/2023 1038   CO2 16 (L) 08/25/2023 1038   GLUCOSE 96 08/25/2023 1038   BUN 85 (H) 08/25/2023 1038   CREATININE 7.11 (H) 08/25/2023 1038   CALCIUM  8.9 08/25/2023 1038   GFRNONAA 14 (L) 04/25/2020 0957   GFRAA 17 (L) 04/25/2020 0957    Lipid Panel     Component Value Date/Time   CHOL 135 08/25/2023 1038   TRIG 51 08/25/2023 1038   HDL 61 08/25/2023 1038   CHOLHDL 2.2 08/25/2023 1038   LDLCALC 61 08/25/2023 1038    CBC    Component Value Date/Time    WBC 7.8 08/25/2023 1038   RBC 3.09 (L) 08/25/2023 1038   HGB 9.4 (L) 08/25/2023 1038   HCT 29.1 (L) 08/25/2023 1038   PLT 277 08/25/2023 1038   MCV 94.2 08/25/2023 1038   MCH 30.4 08/25/2023 1038   MCHC 32.3 08/25/2023 1038   RDW 14.0 08/25/2023 1038   LYMPHSABS 4,015 (H) 05/15/2020 0838   MONOABS 1.1 (H) 03/16/2018 0801   EOSABS 946 (H) 05/15/2020 0838   BASOSABS 66 05/15/2020 0838    Hgb A1C Lab Results  Component Value Date   HGBA1C 5.3 08/25/2023            Assessment & Plan:   Preventative Health Maintenance:  Encouraged her to get a flu shot in the fall She declines tetanus for financial reasons, advised if she gets better To go get this done Prevnar 20 UTD, does not need Pneumovax COVID-vaccine UTD Discussed Shingrix vaccine, she will check coverage with her insurance company schedule visit if she would like to have this done She no longer wants to screen for cervical cancer She declines mammogram Bone density ordered-she will call to schedule Colon screening UTD Encouraged her to consume a balanced diet and exercise regimen Advised her to see an eye doctor and dentist annually We will check CBC, c-Met, lipid, A1c today  Allergic rhinitis:  Advised her to continue Claritin  and start Flonase  OTC Rx for Pred taper x 6 days for symptom management  RTC in 2 weeks, follow-up HTN 6 months, follow-up chronic conditions Helayne Lo, NP

## 2024-01-28 NOTE — Assessment & Plan Note (Signed)
 Encourage diet and exercise for weight loss

## 2024-01-29 LAB — COMPREHENSIVE METABOLIC PANEL WITH GFR
AG Ratio: 1.2 (calc) (ref 1.0–2.5)
ALT: 7 U/L (ref 6–29)
AST: 13 U/L (ref 10–35)
Albumin: 4.4 g/dL (ref 3.6–5.1)
Alkaline phosphatase (APISO): 48 U/L (ref 37–153)
BUN/Creatinine Ratio: 9 (calc) (ref 6–22)
BUN: 83 mg/dL — ABNORMAL HIGH (ref 7–25)
CO2: 12 mmol/L — ABNORMAL LOW (ref 20–32)
Calcium: 9.2 mg/dL (ref 8.6–10.4)
Chloride: 104 mmol/L (ref 98–110)
Creat: 9.1 mg/dL — ABNORMAL HIGH (ref 0.60–1.00)
Globulin: 3.7 g/dL (ref 1.9–3.7)
Glucose, Bld: 106 mg/dL — ABNORMAL HIGH (ref 65–99)
Potassium: 4.6 mmol/L (ref 3.5–5.3)
Sodium: 134 mmol/L — ABNORMAL LOW (ref 135–146)
Total Bilirubin: 0.4 mg/dL (ref 0.2–1.2)
Total Protein: 8.1 g/dL (ref 6.1–8.1)
eGFR: 4 mL/min/{1.73_m2} — ABNORMAL LOW (ref >=60)

## 2024-01-29 LAB — CBC
HCT: 29.5 % — ABNORMAL LOW (ref 35.0–45.0)
Hemoglobin: 9.4 g/dL — ABNORMAL LOW (ref 11.7–15.5)
MCH: 31 pg (ref 27.0–33.0)
MCHC: 31.9 g/dL — ABNORMAL LOW (ref 32.0–36.0)
MCV: 97.4 fL (ref 80.0–100.0)
MPV: 10.2 fL (ref 7.5–12.5)
Platelets: 314 10*3/uL (ref 140–400)
RBC: 3.03 10*6/uL — ABNORMAL LOW (ref 3.80–5.10)
RDW: 14.4 % (ref 11.0–15.0)
WBC: 8 10*3/uL (ref 3.8–10.8)

## 2024-01-29 LAB — LIPID PANEL
Cholesterol: 181 mg/dL (ref <200)
HDL: 48 mg/dL — ABNORMAL LOW (ref >=50)
LDL Cholesterol (Calc): 108 mg/dL — ABNORMAL HIGH
Non-HDL Cholesterol (Calc): 133 mg/dL — ABNORMAL HIGH (ref <130)
Total CHOL/HDL Ratio: 3.8 (calc) (ref <5.0)
Triglycerides: 135 mg/dL (ref <150)

## 2024-01-29 LAB — HEMOGLOBIN A1C
Hgb A1c MFr Bld: 5.2 % (ref <5.7)
Mean Plasma Glucose: 103 mg/dL
eAG (mmol/L): 5.7 mmol/L

## 2024-01-31 ENCOUNTER — Ambulatory Visit: Payer: Self-pay | Admitting: Internal Medicine

## 2024-01-31 ENCOUNTER — Telehealth: Payer: Self-pay

## 2024-01-31 DIAGNOSIS — N186 End stage renal disease: Secondary | ICD-10-CM | POA: Insufficient documentation

## 2024-01-31 NOTE — Telephone Encounter (Signed)
 Call was dropped. Waiting for whomever to call back with further information as to what is needed from us .

## 2024-01-31 NOTE — Telephone Encounter (Signed)
 Copied from CRM [id number]. Topic: Clinical - Medical Advice >> Jan 31, 2024 10:17 AM Lysbeth Sauger wrote: Reason for CRM: wants to know if pt can come in today to [facility] due to lab being low , also needs lab notes and lab from office.

## 2024-02-09 ENCOUNTER — Other Ambulatory Visit: Payer: Self-pay | Admitting: Internal Medicine

## 2024-02-10 NOTE — Telephone Encounter (Signed)
 Requested Prescriptions  Pending Prescriptions Disp Refills   famotidine  (PEPCID ) 20 MG tablet [Pharmacy Med Name: Famotidine  20 MG Oral Tablet] 90 tablet 2    Sig: Take 1 tablet by mouth once daily     Gastroenterology:  H2 Antagonists Passed - 02/10/2024  2:38 PM      Passed - Valid encounter within last 12 months    Recent Outpatient Visits           1 week ago Encounter for general adult medical examination with abnormal findings   [facility] [place], Angeline ORN, NP

## 2024-02-16 ENCOUNTER — Ambulatory Visit: Admitting: Internal Medicine

## 2024-02-16 NOTE — Progress Notes (Deleted)
 Subjective:    Patient ID: Destiny Adams, female    DOB: 27-Oct-1950, 73 y.o.   MRN: [id number]  HPI  Patient presents to clinic today for 2-week follow-up of HTN.  At her last visit, she had run out of amlodipine  10 mg but had continued to take losartan  100 mg and metoprolol  100 mg daily.  She has been taking all 3 blood pressure medications as prescribed.  Her BP today is.  There is no ECG on file.  Review of Systems     Past Medical History:  Diagnosis Date   Anemia    CKD (chronic kidney disease) stage 4, GFR 15-29 ml/min (HCC) 07/23/2017   GERD (gastroesophageal reflux disease)    Hyperlipidemia    Hypertension    Severe obesity (BMI 35.0-35.9 with comorbidity) (HCC) 07/23/2017   Comorbid CKD stage IV, Hypertension, Hyperlipidemia    Current Outpatient Medications  Medication Sig Dispense Refill   acetaminophen (TYLENOL) 650 MG CR tablet Take 650 mg by mouth every 8 (eight) hours as needed for pain.     amLODipine  (NORVASC ) 10 MG tablet Take 1 tablet (10 mg total) by mouth daily. 90 tablet 1   atorvastatin  (LIPITOR) 20 MG tablet Take 1 tablet (20 mg total) by mouth daily. (Patient not taking: Reported on 01/28/2024) 90 tablet 1   calcitRIOL  (ROCALTROL ) 0.5 MCG capsule Take 1 capsule (0.5 mcg total) by mouth daily. 90 capsule 1   dorzolamide-timolol (COSOPT) 2-0.5 % ophthalmic solution Place 1 drop into both eyes 2 (two) times daily.     famotidine  (PEPCID ) 20 MG tablet Take 1 tablet by mouth once daily 90 tablet 2   ferrous sulfate 324 MG TBEC Take 324 mg by mouth.     latanoprost (XALATAN) 0.005 % ophthalmic solution Place 1 drop into both eyes at bedtime.     losartan  (COZAAR ) 50 MG tablet Take 2 tablets (100 mg total) by mouth daily. 180 tablet 1   metoprolol  succinate (TOPROL -XL) 100 MG 24 hr tablet Take 1 tablet (100 mg total) by mouth daily. Take with or immediately following a meal. 90 tablet 1   predniSONE  (DELTASONE ) 10 MG tablet Take 6 tabs on day 1, 5 tabs on day 2, 4  tabs on day 3, 3 tabs on day 4, 2 tabs on day 5, 1 tab on day 6 21 tablet 0   No current facility-administered medications for this visit.    No Known Allergies  Family History  Problem Relation Age of Onset   Colon cancer Maternal Uncle    Colon cancer Paternal Uncle    Breast cancer Paternal Aunt        77   Alzheimer's disease Mother    Cancer Father     Social History   Socioeconomic History   Marital status: Divorced    Spouse name: Not on file   Number of children: Not on file   Years of education: Not on file   Highest education level: Not on file  Occupational History   Occupation: retired  Tobacco Use   Smoking status: Never   Smokeless tobacco: Never  Vaping Use   Vaping status: Never Used  Substance and Sexual Activity   Alcohol use: Not Currently   Drug use: Never   Sexual activity: Yes    Partners: Male  Other Topics Concern   Not on file  Social History Narrative   Not on file   Social Drivers of Health   Financial Resource Strain: Low Risk  (  04/15/2023)   Overall Financial Resource Strain (CARDIA)    Difficulty of Paying Living Expenses: Not hard at all  Food Insecurity: No Food Insecurity (04/15/2023)   Hunger Vital Sign    Worried About Running Out of Food in the Last Year: Never true    Ran Out of Food in the Last Year: Never true  Transportation Needs: No Transportation Needs (04/15/2023)   PRAPARE - Administrator, Civil Service (Medical): No    Lack of Transportation (Non-Medical): No  Physical Activity: Insufficiently Active (04/15/2023)   Exercise Vital Sign    Days of Exercise per Week: 4 days    Minutes of Exercise per Session: 30 min  Stress: No Stress Concern Present (04/15/2023)   [employer] of Occupational Health - Occupational Stress Questionnaire    Feeling of Stress : Not at all  Social Connections: Moderately Isolated (04/15/2023)   Social Connection and Isolation Panel    Frequency of Communication with  Friends and Family: More than three times a week    Frequency of Social Gatherings with Friends and Family: Three times a week    Attends Religious Services: More than 4 times per year    Active Member of Clubs or Organizations: No    Attends Banker Meetings: Never    Marital Status: Divorced  Catering manager Violence: Not At Risk (04/15/2023)   Humiliation, Afraid, Rape, and Kick questionnaire    Fear of Current or Ex-Partner: No    Emotionally Abused: No    Physically Abused: No    Sexually Abused: No     Constitutional: Pt reports headache. Denies fever, malaise, fatigue, or abrupt weight changes.  HEENT: Pt reports sinus pressure and runny nose. Denies eye pain, eye redness, ear pain, ringing in the ears, wax buildup, nasal congestion, bloody nose, or sore throat. Respiratory: Pt reports cough. Denies difficulty breathing, shortness of breath.   Cardiovascular: Denies chest pain, chest tightness, palpitations or swelling in the hands or feet.  Gastrointestinal: Denies abdominal pain, bloating, constipation, diarrhea or blood in the stool.  GU: Denies urgency, frequency, pain with urination, burning sensation, blood in urine, odor or discharge. Musculoskeletal: Denies decrease in range of motion, difficulty with gait, muscle pain or joint pain and swelling.  Skin: Denies redness, rashes, lesions or ulcercations.  Neurological: Denies dizziness, difficulty with memory, difficulty with speech or problems with balance and coordination.  Psych: Denies anxiety, depression, SI/HI.  No other specific complaints in a complete review of systems (except as listed in HPI above).  Objective:   Physical Exam  There were no vitals taken for this visit.   Wt Readings from Last 3 Encounters:  01/28/24 162 lb 9.6 oz (73.8 kg)  08/25/23 174 lb 9.6 oz (79.2 kg)  01/21/23 181 lb (82.1 kg)    General: Appears her stated age, obese, in NAD. Skin: Warm, dry and intact.   Hypopigmented area noted of her nose and face. HEENT: Head: normal shape and size, no sinus tenderness noted.; Eyes: sclera white, no icterus, conjunctiva pink, PERRLA and EOMs intact; Nose: mucosa boggy and moist, turbinates swollen; Throat: mucosa pink and moist, + PND. Neck:  Neck supple, trachea midline. No masses, lumps or thyromegaly present.  Cardiovascular: Normal rate and rhythm. S1,S2 noted.  No murmur, rubs or gallops noted. No JVD or BLE edema. No carotid bruits noted. Pulmonary/Chest: Normal effort and positive vesicular breath sounds. No respiratory distress. No wheezes, rales or ronchi noted.  Abdomen: Soft and nontender.  Normal bowel sounds.  Musculoskeletal: Strength 5/5 BUE/BLE. No difficulty with gait.  Neurological: Alert and oriented. Cranial nerves II-XII grossly intact. Coordination normal.  Psychiatric: Mood and affect normal. Behavior is normal. Judgment and thought content normal.     BMET    Component Value Date/Time   NA 134 (L) 01/28/2024 1042   K 4.6 01/28/2024 1042   CL 104 01/28/2024 1042   CO2 12 (L) 01/28/2024 1042   GLUCOSE 106 (H) 01/28/2024 1042   BUN 83 (H) 01/28/2024 1042   CREATININE 9.10 (H) 01/28/2024 1042   CALCIUM  9.2 01/28/2024 1042   GFRNONAA 14 (L) 04/25/2020 0957   GFRAA 17 (L) 04/25/2020 0957    Lipid Panel     Component Value Date/Time   CHOL 181 01/28/2024 1042   TRIG 135 01/28/2024 1042   HDL 48 (L) 01/28/2024 1042   CHOLHDL 3.8 01/28/2024 1042   LDLCALC 108 (H) 01/28/2024 1042    CBC    Component Value Date/Time   WBC 8.0 01/28/2024 1042   RBC 3.03 (L) 01/28/2024 1042   HGB 9.4 (L) 01/28/2024 1042   HCT 29.5 (L) 01/28/2024 1042   PLT 314 01/28/2024 1042   MCV 97.4 01/28/2024 1042   MCH 31.0 01/28/2024 1042   MCHC 31.9 (L) 01/28/2024 1042   RDW 14.4 01/28/2024 1042   LYMPHSABS 4,015 (H) 05/15/2020 0838   MONOABS 1.1 (H) 03/16/2018 0801   EOSABS 946 (H) 05/15/2020 0838   BASOSABS 66 05/15/2020 0838    Hgb  A1C Lab Results  Component Value Date   HGBA1C 5.2 01/28/2024            Assessment & Plan:    RTC in 5 months, follow-up chronic conditions Angeline Laura, NP

## 2024-02-27 ENCOUNTER — Other Ambulatory Visit: Payer: Self-pay | Admitting: Internal Medicine

## 2024-02-27 DIAGNOSIS — Z0001 Encounter for general adult medical examination with abnormal findings: Secondary | ICD-10-CM

## 2024-02-29 NOTE — Telephone Encounter (Signed)
 Requested Prescriptions  Pending Prescriptions Disp Refills   losartan  (COZAAR ) 50 MG tablet [Pharmacy Med Name: Losartan  Potassium 50 MG Oral Tablet] 180 tablet 0    Sig: Take 2 tablets by mouth once daily     Cardiovascular:  Angiotensin Receptor Blockers Failed - 02/29/2024 10:27 AM      Failed - Cr in normal range and within 180 days    Creat  Date Value Ref Range Status  01/28/2024 9.10 (H) 0.60 - 1.00 mg/dL Final    Comment:    Verified by repeat analysis. .    Creatinine, Urine  Date Value Ref Range Status  03/16/2018 166 mg/dL Final         Failed - Last BP in normal range    BP Readings from Last 1 Encounters:  01/28/24 (!) 152/88         Passed - K in normal range and within 180 days    Potassium  Date Value Ref Range Status  01/28/2024 4.6 3.5 - 5.3 mmol/L Final         Passed - Patient is not pregnant      Passed - Valid encounter within last 6 months    Recent Outpatient Visits           1 month ago Encounter for general adult medical examination with abnormal findings   [place] [facility] [place], Angeline ORN, NP

## 2024-03-16 ENCOUNTER — Other Ambulatory Visit: Payer: Self-pay | Admitting: Internal Medicine

## 2024-03-16 DIAGNOSIS — I120 Hypertensive chronic kidney disease with stage 5 chronic kidney disease or end stage renal disease: Secondary | ICD-10-CM

## 2024-03-17 NOTE — Telephone Encounter (Signed)
 Requested medications are due for refill today.  yes  Requested medications are on the active medications list.  yes  Last refill. 08/25/2023 #(0 1 rf  Future visit scheduled.   yes  Notes to clinic.  Missing labs.    Requested Prescriptions  Pending Prescriptions Disp Refills   calcitRIOL  (ROCALTROL ) 0.5 MCG capsule [Pharmacy Med Name: Calcitriol  0.5 MCG Oral Capsule] 90 capsule 0    Sig: Take 1 capsule by mouth once daily     Endocrinology:  Vitamins - Vitamin D Supplementation - calcitriol  Failed - 03/17/2024 12:28 PM      Failed - Phosphate in normal range and within 360 days    No results found for: PHOS       Failed - PTH in normal range and within 360 days    No results found for: IOPTH, PTHINTACTFNA, PTH       Passed - Ca in normal range and within 360 days    Calcium   Date Value Ref Range Status  01/28/2024 9.2 8.6 - 10.4 mg/dL Final         Passed - Valid encounter within last 12 months    Recent Outpatient Visits           1 month ago Encounter for general adult medical examination with abnormal findings   [place] [facility] [facility] [place], Angeline ORN, NP

## 2024-04-10 ENCOUNTER — Other Ambulatory Visit: Payer: Self-pay | Admitting: Internal Medicine

## 2024-04-10 DIAGNOSIS — Z0001 Encounter for general adult medical examination with abnormal findings: Secondary | ICD-10-CM

## 2024-04-11 NOTE — Telephone Encounter (Signed)
 Requested by interface surescripts. Last OV 2 months ago .  Requested Prescriptions  Pending Prescriptions Disp Refills   metoprolol  succinate (TOPROL -XL) 100 MG 24 hr tablet [Pharmacy Med Name: Metoprolol  Succinate ER 100 MG Oral Tablet Extended Release 24 Hour] 90 tablet 0    Sig: TAKE 1 TABLET BY MOUTH DAILY .TAKE  WITH  OR  IMMEDIATELY  FOLLOWING A  MEAL. PATIENT NEEDS APPOINTMENT FOR FURTHER REFILL     Cardiovascular:  Beta Blockers Failed - 04/11/2024  2:50 PM      Failed - Last BP in normal range    BP Readings from Last 1 Encounters:  01/28/24 (!) 152/88         Passed - Last Heart Rate in normal range    Pulse Readings from Last 1 Encounters:  01/21/23 82         Passed - Valid encounter within last 6 months    Recent Outpatient Visits           2 months ago Encounter for general adult medical examination with abnormal findings   [place] [facility] [place], Angeline ORN, NP

## 2024-04-20 ENCOUNTER — Ambulatory Visit: Payer: 59

## 2024-04-20 ENCOUNTER — Telehealth: Payer: Self-pay

## 2024-04-20 NOTE — Telephone Encounter (Signed)
 Unsuccessful attempts to reach patient on preferred number listed in notes for scheduled AWV. Unable to leave a message.

## 2024-06-02 ENCOUNTER — Other Ambulatory Visit: Payer: Self-pay | Admitting: Internal Medicine

## 2024-06-02 DIAGNOSIS — Z0001 Encounter for general adult medical examination with abnormal findings: Secondary | ICD-10-CM

## 2024-06-05 NOTE — Telephone Encounter (Signed)
 Requested Prescriptions  Pending Prescriptions Disp Refills   losartan  (COZAAR ) 50 MG tablet [Pharmacy Med Name: Losartan  Potassium 50 MG Oral Tablet] 180 tablet 0    Sig: Take 2 tablets by mouth once daily     Cardiovascular:  Angiotensin Receptor Blockers Failed - 06/05/2024 11:47 AM      Failed - Cr in normal range and within 180 days    Creat  Date Value Ref Range Status  01/28/2024 9.10 (H) 0.60 - 1.00 mg/dL Final    Comment:    Verified by repeat analysis. .    Creatinine, Urine  Date Value Ref Range Status  03/16/2018 166 mg/dL Final         Failed - Last BP in normal range    BP Readings from Last 1 Encounters:  01/28/24 (!) 152/88         Passed - K in normal range and within 180 days    Potassium  Date Value Ref Range Status  01/28/2024 4.6 3.5 - 5.3 mmol/L Final         Passed - Patient is not pregnant      Passed - Valid encounter within last 6 months    Recent Outpatient Visits           4 months ago Encounter for general adult medical examination with abnormal findings   [place] [place], Angeline ORN, NP

## 2024-06-29 ENCOUNTER — Ambulatory Visit (INDEPENDENT_AMBULATORY_CARE_PROVIDER_SITE_OTHER): Admitting: Internal Medicine

## 2024-06-29 ENCOUNTER — Other Ambulatory Visit: Payer: Self-pay

## 2024-06-29 ENCOUNTER — Encounter: Payer: Self-pay | Admitting: Internal Medicine

## 2024-06-29 VITALS — BP 158/96 | Ht 62.0 in | Wt 156.4 lb

## 2024-06-29 DIAGNOSIS — I10 Essential (primary) hypertension: Secondary | ICD-10-CM

## 2024-06-29 DIAGNOSIS — I120 Hypertensive chronic kidney disease with stage 5 chronic kidney disease or end stage renal disease: Secondary | ICD-10-CM

## 2024-06-29 DIAGNOSIS — E663 Overweight: Secondary | ICD-10-CM | POA: Diagnosis not present

## 2024-06-29 DIAGNOSIS — Z6828 Body mass index (BMI) 28.0-28.9, adult: Secondary | ICD-10-CM

## 2024-06-29 DIAGNOSIS — N185 Chronic kidney disease, stage 5: Secondary | ICD-10-CM

## 2024-06-29 MED ORDER — LOSARTAN POTASSIUM 100 MG PO TABS: 100.0000 mg | ORAL_TABLET | Freq: Every day | ORAL | 0 refills | Status: DC

## 2024-06-29 MED ORDER — AMLODIPINE BESYLATE 10 MG PO TABS: 10.0000 mg | ORAL_TABLET | Freq: Every day | ORAL | 0 refills | Status: AC

## 2024-06-29 MED ORDER — METOPROLOL SUCCINATE ER 100 MG PO TB24: 100.0000 mg | ORAL_TABLET | Freq: Every day | ORAL | 0 refills | Status: DC

## 2024-06-29 MED ORDER — AMLODIPINE BESYLATE 10 MG PO TABS: 10.0000 mg | ORAL_TABLET | Freq: Every day | ORAL | 0 refills | Status: DC

## 2024-06-29 NOTE — Progress Notes (Signed)
 Subjective:    Patient ID: Destiny Adams, female    DOB: 02-08-1951, 73 y.o.   MRN: [id number]  HPI  Discussed the use of AI scribe software for clinical note transcription with the patient, who gave verbal consent to proceed.  Destiny Adams is a 73 year old female with hypertension and chronic kidney disease who presents for medication refill and follow-up.  She has been out of her blood pressure medications losartan  100 mg daily.  She has been taking amlodipine  10 mg and metoprolol  100 mg, which she had been taking regularly. Her blood pressure remains high.  She was seen June 2025 and was out of her blood pressure medication out at that time.  Her medications were refilled and she was advised to follow-up in 2 weeks to repeat her blood pressure however she did not do that.  She discusses her chronic kidney disease, noting that she was referred to a kidney specialist in June but did not go to this appointment.  Her last creatinine was 9.10, GFR 4, 01/2024.  She does not claim that I have kidney disease, God is taking care of me.  She reports that she is 'peeing pretty good.'    Review of Systems     Past Medical History:  Diagnosis Date   Anemia    CKD (chronic kidney disease) stage 4, GFR 15-29 ml/min (HCC) 07/23/2017   GERD (gastroesophageal reflux disease)    Hyperlipidemia    Hypertension    Severe obesity (BMI 35.0-35.9 with comorbidity) (HCC) 07/23/2017   Comorbid CKD stage IV, Hypertension, Hyperlipidemia    Current Outpatient Medications  Medication Sig Dispense Refill   acetaminophen (TYLENOL) 650 MG CR tablet Take 650 mg by mouth every 8 (eight) hours as needed for pain.     amLODipine  (NORVASC ) 10 MG tablet Take 1 tablet (10 mg total) by mouth daily. 90 tablet 1   atorvastatin  (LIPITOR) 20 MG tablet Take 1 tablet (20 mg total) by mouth daily. (Patient not taking: Reported on 01/28/2024) 90 tablet 1   calcitRIOL  (ROCALTROL ) 0.5 MCG capsule Take 1 capsule by mouth  once daily 90 capsule 0   dorzolamide-timolol (COSOPT) 2-0.5 % ophthalmic solution Place 1 drop into both eyes 2 (two) times daily.     famotidine  (PEPCID ) 20 MG tablet Take 1 tablet by mouth once daily 90 tablet 2   ferrous sulfate 324 MG TBEC Take 324 mg by mouth.     latanoprost (XALATAN) 0.005 % ophthalmic solution Place 1 drop into both eyes at bedtime.     losartan  (COZAAR ) 50 MG tablet Take 2 tablets by mouth once daily 180 tablet 0   metoprolol  succinate (TOPROL -XL) 100 MG 24 hr tablet TAKE 1 TABLET BY MOUTH DAILY .TAKE  WITH  OR  IMMEDIATELY  FOLLOWING A  MEAL. PATIENT NEEDS APPOINTMENT FOR FURTHER REFILL 90 tablet 0   predniSONE  (DELTASONE ) 10 MG tablet Take 6 tabs on day 1, 5 tabs on day 2, 4 tabs on day 3, 3 tabs on day 4, 2 tabs on day 5, 1 tab on day 6 21 tablet 0   No current facility-administered medications for this visit.    No Known Allergies  Family History  Problem Relation Age of Onset   Colon cancer Maternal Uncle    Colon cancer Paternal Uncle    Breast cancer Paternal Aunt        67   Alzheimer's disease Mother    Cancer Father     Social  History   Socioeconomic History   Marital status: Divorced    Spouse name: Not on file   Number of children: Not on file   Years of education: Not on file   Highest education level: Not on file  Occupational History   Occupation: retired  Tobacco Use   Smoking status: Never   Smokeless tobacco: Never  Vaping Use   Vaping status: Never Used  Substance and Sexual Activity   Alcohol use: Not Currently   Drug use: Never   Sexual activity: Yes    Partners: Male  Other Topics Concern   Not on file  Social History Narrative   Not on file   Social Drivers of Health   Financial Resource Strain: Low Risk  (04/15/2023)   Overall Financial Resource Strain (CARDIA)    Difficulty of Paying Living Expenses: Not hard at all  Food Insecurity: No Food Insecurity (04/15/2023)   Hunger Vital Sign    Worried About Running  Out of Food in the Last Year: Never true    Ran Out of Food in the Last Year: Never true  Transportation Needs: No Transportation Needs (04/15/2023)   PRAPARE - Administrator, Civil Service (Medical): No    Lack of Transportation (Non-Medical): No  Physical Activity: Insufficiently Active (04/15/2023)   Exercise Vital Sign    Days of Exercise per Week: 4 days    Minutes of Exercise per Session: 30 min  Stress: No Stress Concern Present (04/15/2023)   [employer] of Occupational Health - Occupational Stress Questionnaire    Feeling of Stress : Not at all  Social Connections: Moderately Isolated (04/15/2023)   Social Connection and Isolation Panel    Frequency of Communication with Friends and Family: More than three times a week    Frequency of Social Gatherings with Friends and Family: Three times a week    Attends Religious Services: More than 4 times per year    Active Member of Clubs or Organizations: No    Attends Banker Meetings: Never    Marital Status: Divorced  Catering Manager Violence: Not At Risk (04/15/2023)   Humiliation, Afraid, Rape, and Kick questionnaire    Fear of Current or Ex-Partner: No    Emotionally Abused: No    Physically Abused: No    Sexually Abused: No     Constitutional: Denies fever, malaise, fatigue, headache or abrupt weight changes.  HEENT: Denies eye pain, eye redness, ear pain, ringing in the ears, wax buildup, runny nose, nasal congestion, bloody nose, or sore throat. Respiratory: Denies difficulty breathing, shortness of breath, cough or sputum production.   Cardiovascular: Denies chest pain, chest tightness, palpitations or swelling in the hands or feet.  Gastrointestinal: Denies abdominal pain, bloating, constipation, diarrhea or blood in the stool.  GU: Denies urgency, frequency, pain with urination, burning sensation, blood in urine, odor or discharge. Musculoskeletal: Denies decrease in range of motion,  difficulty with gait, muscle pain, joint pain or swelling.  Skin: Denies redness, rashes, lesions or ulcercations.  Neurological: Patient reports difficulty with memory.  Denies dizziness, difficulty with speech or problems with balance and coordination.  Psych: Denies anxiety, depression, SI/HI.  No other specific complaints in a complete review of systems (except as listed in HPI above).  Objective:   Physical Exam BP (!) 158/96   Ht 5' 2 (1.575 m)   Wt 156 lb 6.4 oz (70.9 kg)   BMI 28.61 kg/m     Wt Readings from Last  3 Encounters:  01/28/24 162 lb 9.6 oz (73.8 kg)  08/25/23 174 lb 9.6 oz (79.2 kg)  01/21/23 181 lb (82.1 kg)    General: Appears her stated age, overweight, in NAD. Skin: Warm, dry and intact. HEENT: Head: normal shape and size; Eyes: sclera white, no icterus, conjunctiva pink, PERRLA and EOMs intact;  Cardiovascular: Normal rate and rhythm. S1,S2 noted.  No murmur, rubs or gallops noted. No JVD or BLE edema.  Pulmonary/Chest: Normal effort and positive vesicular breath sounds. No respiratory distress. No wheezes, rales or ronchi noted.  Musculoskeletal:No difficulty with gait.  Neurological: Alert and oriented.     BMET    Component Value Date/Time   NA 134 (L) 01/28/2024 1042   K 4.6 01/28/2024 1042   CL 104 01/28/2024 1042   CO2 12 (L) 01/28/2024 1042   GLUCOSE 106 (H) 01/28/2024 1042   BUN 83 (H) 01/28/2024 1042   CREATININE 9.10 (H) 01/28/2024 1042   CALCIUM  9.2 01/28/2024 1042   GFRNONAA 14 (L) 04/25/2020 0957   GFRAA 17 (L) 04/25/2020 0957    Lipid Panel     Component Value Date/Time   CHOL 181 01/28/2024 1042   TRIG 135 01/28/2024 1042   HDL 48 (L) 01/28/2024 1042   CHOLHDL 3.8 01/28/2024 1042   LDLCALC 108 (H) 01/28/2024 1042    CBC    Component Value Date/Time   WBC 8.0 01/28/2024 1042   RBC 3.03 (L) 01/28/2024 1042   HGB 9.4 (L) 01/28/2024 1042   HCT 29.5 (L) 01/28/2024 1042   PLT 314 01/28/2024 1042   MCV 97.4 01/28/2024  1042   MCH 31.0 01/28/2024 1042   MCHC 31.9 (L) 01/28/2024 1042   RDW 14.4 01/28/2024 1042   LYMPHSABS 4,015 (H) 05/15/2020 0838   MONOABS 1.1 (H) 03/16/2018 0801   EOSABS 946 (H) 05/15/2020 0838   BASOSABS 66 05/15/2020 0838    Hgb A1C Lab Results  Component Value Date   HGBA1C 5.2 01/28/2024           Assessment & Plan:   Assessment and Plan   Hypertension Uncontrolled hypertension with BP 152/90 mmHg. Non-adherence to amlodipine  noted. Emphasized medication adherence to prevent complications. - Refilled losartan , amlodipine , and metoprolol  prescriptions. - Instructed to take all three blood pressure medications at least two hours before next appointment. -Reinforced DASH diet and exercise for weight loss - Scheduled follow-up appointment in two weeks to reassess blood pressure control.  Chronic kidney disease, stage V not on dialysis Significantly decreased kidney function. Previous nephrology referral not followed.  - Will check kidney function at upcoming appointment     RTC in 3 weeks for follow-up of chronic conditions Angeline Laura, NP

## 2024-06-29 NOTE — Patient Instructions (Signed)
 Chronic Kidney Disease in Adults: What to Know Chronic kidney disease (CKD) is when lasting damage happens to the kidneys slowly over time. The kidneys are two organs that do many important things in the body. These include: Taking waste and extra fluid out of the blood to make pee (urine). Making hormones. Keeping the right amount of fluids and chemicals in the body. A small amount of kidney damage may not cause problems. You must take steps to help keep the kidney damage from getting worse. A lot of damage may cause kidney failure. Kidney failure means the kidneys can no longer work right. What are the causes? Diabetes. High blood pressure. Diseases that affect the heart and blood vessels. Other kidney diseases. Diseases that affect the body's defense system (immune system). A problem with the flow of pee. This may be caused by: Kidney stones. Cancer. An enlarged prostate, in males. A kidney infection or urinary tract infection (UTI) that keeps coming back. What increases the risk? Getting older. The chances of having CKD increase with age. A family history of kidney disease or kidney failure. Having a disease caused by genes. Taking medicines that can harm the kidneys. Being near or having contact with harmful substances. Being very overweight. Using tobacco now or in the past. What are the signs or symptoms? Common symptoms of CKD include: Feeling very tired and having less energy. Swelling of the face, legs, ankles, or feet. Throwing up or feeling like you may throw up. Not wanting to eat as much as normal. Being confused or not able to focus. Twitches and cramps in the leg muscles or other muscles. Dry, itchy skin. Other symptoms may include: Shortness of breath. Trouble sleeping. Making less pee, or making more pee, especially at night. A taste of metal in your mouth. You may also become anemic. Anemia means there's not enough red blood cells in your blood. You may get  symptoms slowly. You may not notice them until the kidney damage gets very bad. How is this diagnosed? CKD may be diagnosed based on: Tests on your blood or pee. Imaging tests, like an ultrasound or a CT scan. A kidney biopsy. For this test, a sample of kidney tissue is removed to be looked at under a microscope. These tests will help to find out how serious the CKD is. How is this treated? Often, there's no cure for CKD. Treatment can help with symptoms and help keep the disease from getting worse. Treatment may include: Treating other problems that are causing your CKD or making it worse. Diet changes. You may need to: Avoid alcohol. Avoid foods that are high in salt, potassium, phosphorous, and protein. Taking medicines for symptoms and to help control other conditions. Dialysis. This treatment gets harmful waste out of your body. It may be needed if you have kidney failure. Follow these instructions at home: Medicines Take your medicines only as told. The amount of some medicines you take may need to be changed. Do not take any new medicines, vitamins, or supplements unless your health care provider says it's okay. These may make kidney damage worse. Lifestyle Do not smoke, vape, or use nicotine or tobacco. If you drink alcohol: Limit how much you have to: 0-1 drink a day if you're female. 0-2 drinks a day if you're female. Know how much alcohol is in your drink. In the [place] one drink is one 12 oz bottle of beer (355 mL), one 5 oz glass of wine (148 mL), or one 1 oz  glass of hard liquor (44 mL). Stay at a healthy weight. If you need help, ask your provider. General instructions  Eat and drink as told. Track your blood pressure at home. Tell your provider about any changes. If you have diabetes, track your blood sugar as told. Exercise at least 30 minutes a day, 5 days a week. Keep your shots (vaccinations) up to date. Keep all follow-up visits. Your provider may need to change  your treatments over time. Where to find support American Kidney Fund: [place].com.au Kidney School: kidneyschool.org American Association of Kidney Patients: https://www.miller-montoya.com/ Where to find more information National Kidney Foundation: kidney.org Centers for Disease Control and Prevention. To learn more: Go to DiningCalendar.de. Click "Search". Type "chronic kidney disease" in the search box. Contact a health care provider if: You have new symptoms. You get symptoms of end-stage kidney disease. These include: Headaches. Numbness in your hands or feet. Leg cramps. Easy bruising. Get help right away if: You have a fever. You make less pee than usual. You have pain or bleeding when you pee or poop. You have chest pain. You have shortness of breath. These symptoms may be an emergency. Call 911 right away. Do not wait to see if the symptoms will go away. Do not drive yourself to the hospital. This information is not intended to replace advice given to you by your health care provider. Make sure you discuss any questions you have with your health care provider. Document Revised: 06/15/2023 Document Reviewed: 02/05/2023 Elsevier Patient Education  2024 [employer].

## 2024-06-29 NOTE — Assessment & Plan Note (Signed)
 Encourage diet and exercise for weight loss

## 2024-06-30 ENCOUNTER — Other Ambulatory Visit: Payer: Self-pay | Admitting: Internal Medicine

## 2024-06-30 DIAGNOSIS — I120 Hypertensive chronic kidney disease with stage 5 chronic kidney disease or end stage renal disease: Secondary | ICD-10-CM

## 2024-07-03 NOTE — Telephone Encounter (Signed)
 Requested Prescriptions  Pending Prescriptions Disp Refills   calcitRIOL  (ROCALTROL ) 0.5 MCG capsule [Pharmacy Med Name: Calcitriol  0.5 MCG Oral Capsule] 90 capsule 0    Sig: Take 1 capsule by mouth once daily     Endocrinology:  Vitamins - Vitamin D Supplementation - calcitriol  Failed - 07/03/2024  1:54 PM      Failed - Phosphate in normal range and within 360 days    No results found for: PHOS       Failed - PTH in normal range and within 360 days    No results found for: IOPTH, PTHINTACTFNA, PTH       Passed - Ca in normal range and within 360 days    Calcium   Date Value Ref Range Status  01/28/2024 9.2 8.6 - 10.4 mg/dL Final         Passed - Valid encounter within last 12 months    Recent Outpatient Visits           4 days ago Primary hypertension   [facility], Angeline ORN, NP   5 months ago Encounter for general adult medical examination with abnormal findings   [facility], Angeline ORN, NP

## 2024-07-25 ENCOUNTER — Ambulatory Visit: Payer: Self-pay

## 2024-07-25 ENCOUNTER — Ambulatory Visit: Admitting: Internal Medicine

## 2024-07-25 NOTE — Progress Notes (Deleted)
 Subjective:    Patient ID: Destiny Adams, female    DOB: August 17, 1951, 73 y.o.   MRN: [id number]  HPI  Patient presents to clinic today for follow-up of chronic conditions.  HTN: Her BP today is 138/82.  She is taking amlodipine , losartan  and metoprolol  as prescribed.  There is no ECG on file.  HLD: Her last LDL was 108, triglycerides 135, 01/2024.  She denies myalgias on atorvastatin .  She tries to consume a low-fat diet.  GERD: Triggered by greasy and acidic food.  She takes famotidine  only as needed.  There is no upper GI on file.  Hyperparathyroidism: Secondary to CKD.  She takes calcitriol  and sodium bicarb as prescribed.  She  does not follow with endocrinology.  CKD 5: Her last creatinine was 9.1, GFR 4, 01/2024.  She is on losartan  for renal protection.  She does not claim that she has kidney disease.  She does not follow with nephrology.  Anemia of CKD: Her last H/H was 9.4/29.5, 01/2024.  She is taking oral iron at this time.  She does not follow with hematology.  Gout: She denies recent flare. She does not take any preventative medication for this. She does not follow with rheumatology.  Review of Systems     Past Medical History:  Diagnosis Date   Anemia    CKD (chronic kidney disease) stage 4, GFR 15-29 ml/min (HCC) 07/23/2017   GERD (gastroesophageal reflux disease)    Hyperlipidemia    Hypertension    Severe obesity (BMI 35.0-35.9 with comorbidity) (HCC) 07/23/2017   Comorbid CKD stage IV, Hypertension, Hyperlipidemia    Current Outpatient Medications  Medication Sig Dispense Refill   acetaminophen (TYLENOL) 650 MG CR tablet Take 650 mg by mouth every 8 (eight) hours as needed for pain.     amLODipine  (NORVASC ) 10 MG tablet Take 1 tablet (10 mg total) by mouth daily. 30 tablet 0   atorvastatin  (LIPITOR) 20 MG tablet Take 1 tablet (20 mg total) by mouth daily. (Patient not taking: Reported on 06/29/2024) 90 tablet 1   calcitRIOL  (ROCALTROL ) 0.5 MCG capsule Take 1  capsule by mouth once daily 90 capsule 0   famotidine  (PEPCID ) 20 MG tablet Take 1 tablet by mouth once daily 90 tablet 2   ferrous sulfate 324 MG TBEC Take 324 mg by mouth.     losartan  (COZAAR ) 100 MG tablet Take 1 tablet (100 mg total) by mouth daily. 30 tablet 0   metoprolol  succinate (TOPROL -XL) 100 MG 24 hr tablet Take 1 tablet (100 mg total) by mouth daily. Take with or immediately following a meal. 30 tablet 0   No current facility-administered medications for this visit.    No Known Allergies  Family History  Problem Relation Age of Onset   Colon cancer Maternal Uncle    Colon cancer Paternal Uncle    Breast cancer Paternal Aunt        59   Alzheimer's disease Mother    Cancer Father     Social History   Socioeconomic History   Marital status: Divorced    Spouse name: Not on file   Number of children: Not on file   Years of education: Not on file   Highest education level: Not on file  Occupational History   Occupation: retired  Tobacco Use   Smoking status: Never   Smokeless tobacco: Never  Vaping Use   Vaping status: Never Used  Substance and Sexual Activity   Alcohol use: Not Currently  Drug use: Never   Sexual activity: Yes    Partners: Male  Other Topics Concern   Not on file  Social History Narrative   Not on file   Social Drivers of Health   Financial Resource Strain: Low Risk  (04/15/2023)   Overall Financial Resource Strain (CARDIA)    Difficulty of Paying Living Expenses: Not hard at all  Food Insecurity: No Food Insecurity (04/15/2023)   Hunger Vital Sign    Worried About Running Out of Food in the Last Year: Never true    Ran Out of Food in the Last Year: Never true  Transportation Needs: No Transportation Needs (04/15/2023)   PRAPARE - Administrator, Civil Service (Medical): No    Lack of Transportation (Non-Medical): No  Physical Activity: Insufficiently Active (04/15/2023)   Exercise Vital Sign    Days of Exercise per Week:  4 days    Minutes of Exercise per Session: 30 min  Stress: No Stress Concern Present (04/15/2023)   [employer] of Occupational Health - Occupational Stress Questionnaire    Feeling of Stress : Not at all  Social Connections: Moderately Isolated (04/15/2023)   Social Connection and Isolation Panel    Frequency of Communication with Friends and Family: More than three times a week    Frequency of Social Gatherings with Friends and Family: Three times a week    Attends Religious Services: More than 4 times per year    Active Member of Clubs or Organizations: No    Attends Banker Meetings: Never    Marital Status: Divorced  Catering Manager Violence: Not At Risk (04/15/2023)   Humiliation, Afraid, Rape, and Kick questionnaire    Fear of Current or Ex-Partner: No    Emotionally Abused: No    Physically Abused: No    Sexually Abused: No     Constitutional: Denies fever, malaise, fatigue, headache or abrupt weight changes.  HEENT: Denies eye pain, eye redness, ear pain, ringing in the ears, wax buildup, runny nose, nasal congestion, bloody nose, or sore throat. Respiratory: Denies difficulty breathing, shortness of breath, cough or sputum production.   Cardiovascular: Denies chest pain, chest tightness, palpitations or swelling in the hands or feet.  Gastrointestinal: Denies abdominal pain, bloating, constipation, diarrhea or blood in the stool.  GU: Denies urgency, frequency, pain with urination, burning sensation, blood in urine, odor or discharge. Musculoskeletal: Denies decrease in range of motion, difficulty with gait, muscle pain, joint pain or swelling.  Skin: Denies redness, rashes, lesions or ulcercations.  Neurological: Denies dizziness, difficulty with memory, difficulty with speech or problems with balance and coordination.  Psych: Denies anxiety, depression, SI/HI.  No other specific complaints in a complete review of systems (except as listed in HPI  above).  Objective:   Physical Exam  There were no vitals taken for this visit.   Wt Readings from Last 3 Encounters:  06/29/24 156 lb 6.4 oz (70.9 kg)  01/28/24 162 lb 9.6 oz (73.8 kg)  08/25/23 174 lb 9.6 oz (79.2 kg)    General: Appears her stated age, obese, in NAD. Skin: Warm, dry and intact. HEENT: Head: normal shape and size; Eyes: sclera white, no icterus, conjunctiva pink, PERRLA and EOMs intact;  Cardiovascular: Normal rate and rhythm. S1,S2 noted.  No murmur, rubs or gallops noted. No JVD or BLE edema. No carotid bruits noted. Pulmonary/Chest: Normal effort and positive vesicular breath sounds. No respiratory distress. No wheezes, rales or ronchi noted.  Abdomen:  Normal  bowel sounds.  Musculoskeletal:No difficulty with gait.  Neurological: Alert and oriented.    BMET    Component Value Date/Time   NA 134 (L) 01/28/2024 1042   K 4.6 01/28/2024 1042   CL 104 01/28/2024 1042   CO2 12 (L) 01/28/2024 1042   GLUCOSE 106 (H) 01/28/2024 1042   BUN 83 (H) 01/28/2024 1042   CREATININE 9.10 (H) 01/28/2024 1042   CALCIUM  9.2 01/28/2024 1042   GFRNONAA 14 (L) 04/25/2020 0957   GFRAA 17 (L) 04/25/2020 0957    Lipid Panel     Component Value Date/Time   CHOL 181 01/28/2024 1042   TRIG 135 01/28/2024 1042   HDL 48 (L) 01/28/2024 1042   CHOLHDL 3.8 01/28/2024 1042   LDLCALC 108 (H) 01/28/2024 1042    CBC    Component Value Date/Time   WBC 8.0 01/28/2024 1042   RBC 3.03 (L) 01/28/2024 1042   HGB 9.4 (L) 01/28/2024 1042   HCT 29.5 (L) 01/28/2024 1042   PLT 314 01/28/2024 1042   MCV 97.4 01/28/2024 1042   MCH 31.0 01/28/2024 1042   MCHC 31.9 (L) 01/28/2024 1042   RDW 14.4 01/28/2024 1042   LYMPHSABS 4,015 (H) 05/15/2020 0838   MONOABS 1.1 (H) 03/16/2018 0801   EOSABS 946 (H) 05/15/2020 0838   BASOSABS 66 05/15/2020 0838    Hgb A1C Lab Results  Component Value Date   HGBA1C 5.2 01/28/2024           Assessment & Plan:     RTC in 6 months for  your annual exam Angeline Laura, NP

## 2024-07-25 NOTE — Telephone Encounter (Signed)
 FYI Only or Action Required?: Action required by provider: clinical question for provider.  Patient was last seen in primary care on 06/29/2024 by Antonette Angeline ORN, NP.  Called Nurse Triage reporting Advice Only.   Triage Disposition: Information or Advice Only Call  Patient/caregiver understands and will follow disposition?: No, wishes to speak with PCP    Copied from CRM #[id number]. Topic: Clinical - Red Word Triage >> Jul 25, 2024  9:44 AM Wess RAMAN wrote: Red Word that prompted transfer to Nurse Triage: Bad cold, sneezing, runny nose, bad headache, Reason for Disposition  Health information question, no triage required and triager able to answer question  Answer Assessment - Initial Assessment Questions 1. REASON FOR CALL: What is the main reason for your call? or How can I best help you?     Pt called stating someone called wanting her to make an appointment -  pt did not know what kind of appt needed to be made or why.  Nurse looked in chart and did not see any notes r/t the need to schedule an appointment.  Pt stated she is sick, not feeling well and will call back at a later time to attempt to find out about the appointment.  Nurse offered to schedule pt an appointment to be evaluated for sod s/s: pt refused.  Protocols used: Information Only Call - No Triage-A-AH

## 2024-07-26 ENCOUNTER — Ambulatory Visit: Admitting: Internal Medicine

## 2024-07-27 ENCOUNTER — Other Ambulatory Visit: Payer: Self-pay | Admitting: Internal Medicine

## 2024-07-27 DIAGNOSIS — I10 Essential (primary) hypertension: Secondary | ICD-10-CM

## 2024-07-31 NOTE — Telephone Encounter (Signed)
 Requested Prescriptions  Pending Prescriptions Disp Refills   losartan  (COZAAR ) 100 MG tablet [Pharmacy Med Name: Losartan  Potassium 100 MG Oral Tablet] 90 tablet 0    Sig: Take 1 tablet by mouth once daily     Cardiovascular:  Angiotensin Receptor Blockers Failed - 07/31/2024 10:36 AM      Failed - Cr in normal range and within 180 days    Creat  Date Value Ref Range Status  01/28/2024 9.10 (H) 0.60 - 1.00 mg/dL Final    Comment:    Verified by repeat analysis. .    Creatinine, Urine  Date Value Ref Range Status  03/16/2018 166 mg/dL Final         Failed - K in normal range and within 180 days    Potassium  Date Value Ref Range Status  01/28/2024 4.6 3.5 - 5.3 mmol/L Final         Failed - Last BP in normal range    BP Readings from Last 1 Encounters:  06/29/24 (!) 158/96         Passed - Patient is not pregnant      Passed - Valid encounter within last 6 months    Recent Outpatient Visits           1 month ago Primary hypertension   [facility] [place], Angeline ORN, NP   6 months ago Encounter for general adult medical examination with abnormal findings   [facility] [place], Angeline ORN, NP

## 2024-08-23 ENCOUNTER — Other Ambulatory Visit: Payer: Self-pay | Admitting: Internal Medicine

## 2024-08-23 DIAGNOSIS — I10 Essential (primary) hypertension: Secondary | ICD-10-CM

## 2024-08-24 NOTE — Telephone Encounter (Signed)
 Requested Prescriptions  Pending Prescriptions Disp Refills   metoprolol  succinate (TOPROL -XL) 100 MG 24 hr tablet [Pharmacy Med Name: Metoprolol  Succinate ER 100 MG Oral Tablet Extended Release 24 Hour] 90 tablet 0    Sig: TAKE 1 TABLET BY MOUTH ONCE DAILY WITH  OR  IMMEDIATELY  FOLLOWING  A  MEAL     Cardiovascular:  Beta Blockers Failed - 08/24/2024 12:35 PM      Failed - Last BP in normal range    BP Readings from Last 1 Encounters:  06/29/24 (!) 158/96         Passed - Last Heart Rate in normal range    Pulse Readings from Last 1 Encounters:  01/21/23 82         Passed - Valid encounter within last 6 months    Recent Outpatient Visits           1 month ago Primary hypertension   [facility] [place], Angeline ORN, NP   6 months ago Encounter for general adult medical examination with abnormal findings   [facility] [place], Angeline ORN, NP
